# Patient Record
Sex: Male | Born: 1935 | Race: Black or African American | Hispanic: No | Smoking: Former smoker
Health system: Southern US, Community
[De-identification: ages and names within clinical notes are randomized; demographics above are authoritative.]

## PROBLEM LIST (undated history)

## (undated) DIAGNOSIS — G459 Transient cerebral ischemic attack, unspecified: Secondary | ICD-10-CM

## (undated) DIAGNOSIS — G20 Parkinson's disease: Secondary | ICD-10-CM

## (undated) DIAGNOSIS — I10 Essential (primary) hypertension: Secondary | ICD-10-CM

## (undated) DIAGNOSIS — G20A1 Parkinson's disease without dyskinesia, without mention of fluctuations: Secondary | ICD-10-CM

## (undated) DIAGNOSIS — R0602 Shortness of breath: Secondary | ICD-10-CM

## (undated) DIAGNOSIS — I209 Angina pectoris, unspecified: Secondary | ICD-10-CM

## (undated) DIAGNOSIS — I2510 Atherosclerotic heart disease of native coronary artery without angina pectoris: Secondary | ICD-10-CM

## (undated) DIAGNOSIS — I639 Cerebral infarction, unspecified: Secondary | ICD-10-CM

## (undated) DIAGNOSIS — F0390 Unspecified dementia without behavioral disturbance: Secondary | ICD-10-CM

## (undated) DIAGNOSIS — C3490 Malignant neoplasm of unspecified part of unspecified bronchus or lung: Secondary | ICD-10-CM

## (undated) DIAGNOSIS — N289 Disorder of kidney and ureter, unspecified: Secondary | ICD-10-CM

## (undated) HISTORY — PX: LOBECTOMY: SHX5089

## (undated) HISTORY — PX: CORONARY ANGIOPLASTY WITH STENT PLACEMENT: SHX49

## (undated) HISTORY — PX: LASIK: SHX215

---

## 1998-09-23 ENCOUNTER — Inpatient Hospital Stay (HOSPITAL_COMMUNITY): Admission: EM | Admit: 1998-09-23 | Discharge: 1998-09-27 | Payer: Self-pay | Admitting: Emergency Medicine

## 1998-09-23 ENCOUNTER — Encounter: Payer: Self-pay | Admitting: Emergency Medicine

## 1998-12-10 ENCOUNTER — Other Ambulatory Visit: Admission: RE | Admit: 1998-12-10 | Discharge: 1998-12-10 | Payer: Self-pay | Admitting: Urology

## 1998-12-22 ENCOUNTER — Ambulatory Visit (HOSPITAL_COMMUNITY): Admission: RE | Admit: 1998-12-22 | Discharge: 1998-12-22 | Payer: Self-pay | Admitting: Cardiology

## 1998-12-22 ENCOUNTER — Encounter: Payer: Self-pay | Admitting: Cardiology

## 1999-01-09 ENCOUNTER — Encounter: Payer: Self-pay | Admitting: Emergency Medicine

## 1999-01-09 ENCOUNTER — Emergency Department (HOSPITAL_COMMUNITY): Admission: EM | Admit: 1999-01-09 | Discharge: 1999-01-09 | Payer: Self-pay | Admitting: Emergency Medicine

## 1999-09-30 ENCOUNTER — Ambulatory Visit (HOSPITAL_COMMUNITY): Admission: RE | Admit: 1999-09-30 | Discharge: 1999-09-30 | Payer: Self-pay | Admitting: Cardiology

## 1999-09-30 ENCOUNTER — Encounter: Payer: Self-pay | Admitting: Cardiology

## 2000-04-27 ENCOUNTER — Ambulatory Visit (HOSPITAL_COMMUNITY): Admission: RE | Admit: 2000-04-27 | Discharge: 2000-04-27 | Payer: Self-pay | Admitting: *Deleted

## 2000-05-31 ENCOUNTER — Ambulatory Visit (HOSPITAL_COMMUNITY): Admission: RE | Admit: 2000-05-31 | Discharge: 2000-06-01 | Payer: Self-pay | Admitting: Cardiology

## 2000-05-31 ENCOUNTER — Encounter: Payer: Self-pay | Admitting: Cardiology

## 2000-07-14 ENCOUNTER — Inpatient Hospital Stay (HOSPITAL_COMMUNITY): Admission: EM | Admit: 2000-07-14 | Discharge: 2000-07-15 | Payer: Self-pay | Admitting: Emergency Medicine

## 2000-07-14 ENCOUNTER — Encounter: Payer: Self-pay | Admitting: Emergency Medicine

## 2000-07-15 ENCOUNTER — Encounter: Payer: Self-pay | Admitting: Cardiology

## 2000-07-23 ENCOUNTER — Encounter: Payer: Self-pay | Admitting: Emergency Medicine

## 2000-07-23 ENCOUNTER — Emergency Department (HOSPITAL_COMMUNITY): Admission: EM | Admit: 2000-07-23 | Discharge: 2000-07-23 | Payer: Self-pay | Admitting: Emergency Medicine

## 2000-10-30 ENCOUNTER — Encounter: Payer: Self-pay | Admitting: Cardiology

## 2000-10-30 ENCOUNTER — Ambulatory Visit (HOSPITAL_COMMUNITY): Admission: RE | Admit: 2000-10-30 | Discharge: 2000-10-30 | Payer: Self-pay | Admitting: Cardiology

## 2000-11-09 ENCOUNTER — Ambulatory Visit (HOSPITAL_COMMUNITY): Admission: RE | Admit: 2000-11-09 | Discharge: 2000-11-09 | Payer: Self-pay | Admitting: Pulmonary Disease

## 2000-11-09 ENCOUNTER — Encounter: Payer: Self-pay | Admitting: Pulmonary Disease

## 2001-02-25 ENCOUNTER — Encounter: Admission: RE | Admit: 2001-02-25 | Discharge: 2001-02-25 | Payer: Self-pay | Admitting: Cardiology

## 2001-02-25 ENCOUNTER — Encounter: Payer: Self-pay | Admitting: Cardiology

## 2001-03-12 ENCOUNTER — Ambulatory Visit (HOSPITAL_COMMUNITY): Admission: RE | Admit: 2001-03-12 | Discharge: 2001-03-12 | Payer: Self-pay | Admitting: Pulmonary Disease

## 2001-03-12 ENCOUNTER — Encounter: Payer: Self-pay | Admitting: Pulmonary Disease

## 2001-05-15 ENCOUNTER — Ambulatory Visit (HOSPITAL_COMMUNITY): Admission: RE | Admit: 2001-05-15 | Discharge: 2001-05-15 | Payer: Self-pay | Admitting: Cardiology

## 2001-05-15 ENCOUNTER — Encounter: Payer: Self-pay | Admitting: Cardiology

## 2001-05-21 ENCOUNTER — Ambulatory Visit (HOSPITAL_COMMUNITY): Admission: RE | Admit: 2001-05-21 | Discharge: 2001-05-22 | Payer: Self-pay | Admitting: Cardiology

## 2001-12-12 ENCOUNTER — Ambulatory Visit (HOSPITAL_COMMUNITY): Admission: RE | Admit: 2001-12-12 | Discharge: 2001-12-12 | Payer: Self-pay | Admitting: Pulmonary Disease

## 2001-12-12 ENCOUNTER — Encounter: Payer: Self-pay | Admitting: Pulmonary Disease

## 2002-04-17 ENCOUNTER — Ambulatory Visit (HOSPITAL_COMMUNITY): Admission: RE | Admit: 2002-04-17 | Discharge: 2002-04-17 | Payer: Self-pay | Admitting: General Surgery

## 2002-04-17 ENCOUNTER — Encounter (INDEPENDENT_AMBULATORY_CARE_PROVIDER_SITE_OTHER): Payer: Self-pay | Admitting: Specialist

## 2002-06-10 ENCOUNTER — Encounter: Payer: Self-pay | Admitting: Cardiology

## 2002-06-10 ENCOUNTER — Encounter: Admission: RE | Admit: 2002-06-10 | Discharge: 2002-06-10 | Payer: Self-pay | Admitting: Cardiology

## 2002-12-04 ENCOUNTER — Emergency Department (HOSPITAL_COMMUNITY): Admission: EM | Admit: 2002-12-04 | Discharge: 2002-12-04 | Payer: Self-pay | Admitting: Emergency Medicine

## 2002-12-16 ENCOUNTER — Emergency Department (HOSPITAL_COMMUNITY): Admission: EM | Admit: 2002-12-16 | Discharge: 2002-12-17 | Payer: Self-pay

## 2003-01-27 ENCOUNTER — Ambulatory Visit (HOSPITAL_COMMUNITY): Admission: RE | Admit: 2003-01-27 | Discharge: 2003-01-27 | Payer: Self-pay | Admitting: Cardiology

## 2003-03-05 ENCOUNTER — Emergency Department (HOSPITAL_COMMUNITY): Admission: AD | Admit: 2003-03-05 | Discharge: 2003-03-05 | Payer: Self-pay | Admitting: Family Medicine

## 2003-03-16 ENCOUNTER — Ambulatory Visit (HOSPITAL_COMMUNITY): Admission: RE | Admit: 2003-03-16 | Discharge: 2003-03-16 | Payer: Self-pay | Admitting: Cardiology

## 2003-05-23 ENCOUNTER — Inpatient Hospital Stay (HOSPITAL_COMMUNITY): Admission: EM | Admit: 2003-05-23 | Discharge: 2003-05-28 | Payer: Self-pay | Admitting: *Deleted

## 2003-05-25 ENCOUNTER — Encounter (INDEPENDENT_AMBULATORY_CARE_PROVIDER_SITE_OTHER): Payer: Self-pay | Admitting: Cardiology

## 2003-06-16 ENCOUNTER — Encounter: Admission: RE | Admit: 2003-06-16 | Discharge: 2003-06-16 | Payer: Self-pay | Admitting: Cardiology

## 2003-06-30 ENCOUNTER — Encounter: Admission: RE | Admit: 2003-06-30 | Discharge: 2003-06-30 | Payer: Self-pay | Admitting: Cardiology

## 2003-07-19 ENCOUNTER — Emergency Department (HOSPITAL_COMMUNITY): Admission: EM | Admit: 2003-07-19 | Discharge: 2003-07-19 | Payer: Self-pay | Admitting: Emergency Medicine

## 2003-08-28 ENCOUNTER — Ambulatory Visit (HOSPITAL_COMMUNITY): Admission: RE | Admit: 2003-08-28 | Discharge: 2003-08-28 | Payer: Self-pay | Admitting: Cardiology

## 2003-09-23 ENCOUNTER — Ambulatory Visit (HOSPITAL_COMMUNITY): Admission: RE | Admit: 2003-09-23 | Discharge: 2003-09-23 | Payer: Self-pay | Admitting: Cardiology

## 2003-12-15 ENCOUNTER — Inpatient Hospital Stay (HOSPITAL_COMMUNITY): Admission: RE | Admit: 2003-12-15 | Discharge: 2003-12-17 | Payer: Self-pay | Admitting: Neurosurgery

## 2004-03-01 ENCOUNTER — Encounter: Admission: RE | Admit: 2004-03-01 | Discharge: 2004-04-08 | Payer: Self-pay | Admitting: Neurosurgery

## 2004-05-03 ENCOUNTER — Ambulatory Visit (HOSPITAL_COMMUNITY): Admission: RE | Admit: 2004-05-03 | Discharge: 2004-05-03 | Payer: Self-pay | Admitting: Cardiology

## 2004-06-07 ENCOUNTER — Inpatient Hospital Stay (HOSPITAL_COMMUNITY): Admission: RE | Admit: 2004-06-07 | Discharge: 2004-06-07 | Payer: Self-pay | Admitting: Neurosurgery

## 2004-08-23 ENCOUNTER — Emergency Department (HOSPITAL_COMMUNITY): Admission: EM | Admit: 2004-08-23 | Discharge: 2004-08-23 | Payer: Self-pay | Admitting: Emergency Medicine

## 2004-08-25 ENCOUNTER — Encounter: Admission: RE | Admit: 2004-08-25 | Discharge: 2004-08-25 | Payer: Self-pay | Admitting: Neurology

## 2004-09-12 ENCOUNTER — Ambulatory Visit (HOSPITAL_COMMUNITY): Admission: RE | Admit: 2004-09-12 | Discharge: 2004-09-12 | Payer: Self-pay | Admitting: Cardiology

## 2004-11-18 ENCOUNTER — Ambulatory Visit (HOSPITAL_COMMUNITY): Admission: RE | Admit: 2004-11-18 | Discharge: 2004-11-18 | Payer: Self-pay | Admitting: Thoracic Surgery

## 2004-11-18 ENCOUNTER — Encounter (INDEPENDENT_AMBULATORY_CARE_PROVIDER_SITE_OTHER): Payer: Self-pay | Admitting: *Deleted

## 2004-12-01 ENCOUNTER — Ambulatory Visit (HOSPITAL_COMMUNITY): Admission: RE | Admit: 2004-12-01 | Discharge: 2004-12-01 | Payer: Self-pay | Admitting: Thoracic Surgery

## 2004-12-01 ENCOUNTER — Encounter (INDEPENDENT_AMBULATORY_CARE_PROVIDER_SITE_OTHER): Payer: Self-pay | Admitting: Specialist

## 2004-12-03 ENCOUNTER — Emergency Department (HOSPITAL_COMMUNITY): Admission: EM | Admit: 2004-12-03 | Discharge: 2004-12-03 | Payer: Self-pay | Admitting: Emergency Medicine

## 2004-12-30 ENCOUNTER — Inpatient Hospital Stay (HOSPITAL_COMMUNITY): Admission: RE | Admit: 2004-12-30 | Discharge: 2005-01-06 | Payer: Self-pay | Admitting: Thoracic Surgery

## 2004-12-30 ENCOUNTER — Encounter (INDEPENDENT_AMBULATORY_CARE_PROVIDER_SITE_OTHER): Payer: Self-pay | Admitting: *Deleted

## 2005-01-05 ENCOUNTER — Ambulatory Visit: Payer: Self-pay | Admitting: Internal Medicine

## 2005-01-11 ENCOUNTER — Encounter: Admission: RE | Admit: 2005-01-11 | Discharge: 2005-01-11 | Payer: Self-pay | Admitting: Thoracic Surgery

## 2005-01-12 IMAGING — CR DG CHEST 2V
2 series · 2 of 2 positions shown · non-contrast
Comparison: none

CLINICAL DATA: Shortness of breath intermittently.  History of vascular stent. 
 CHEST, TWO VIEWS

  The heart size and mediastinal contours are normal. The lungs are clear. The visualized skeleton is unremarkable.  There is no interval change since [REDACTED] portable chest x-ray 05/23/03.  Coronary artery stents are noted. 
 IMPRESSION
 No active disease.

[view not recorded (1 of 2)]
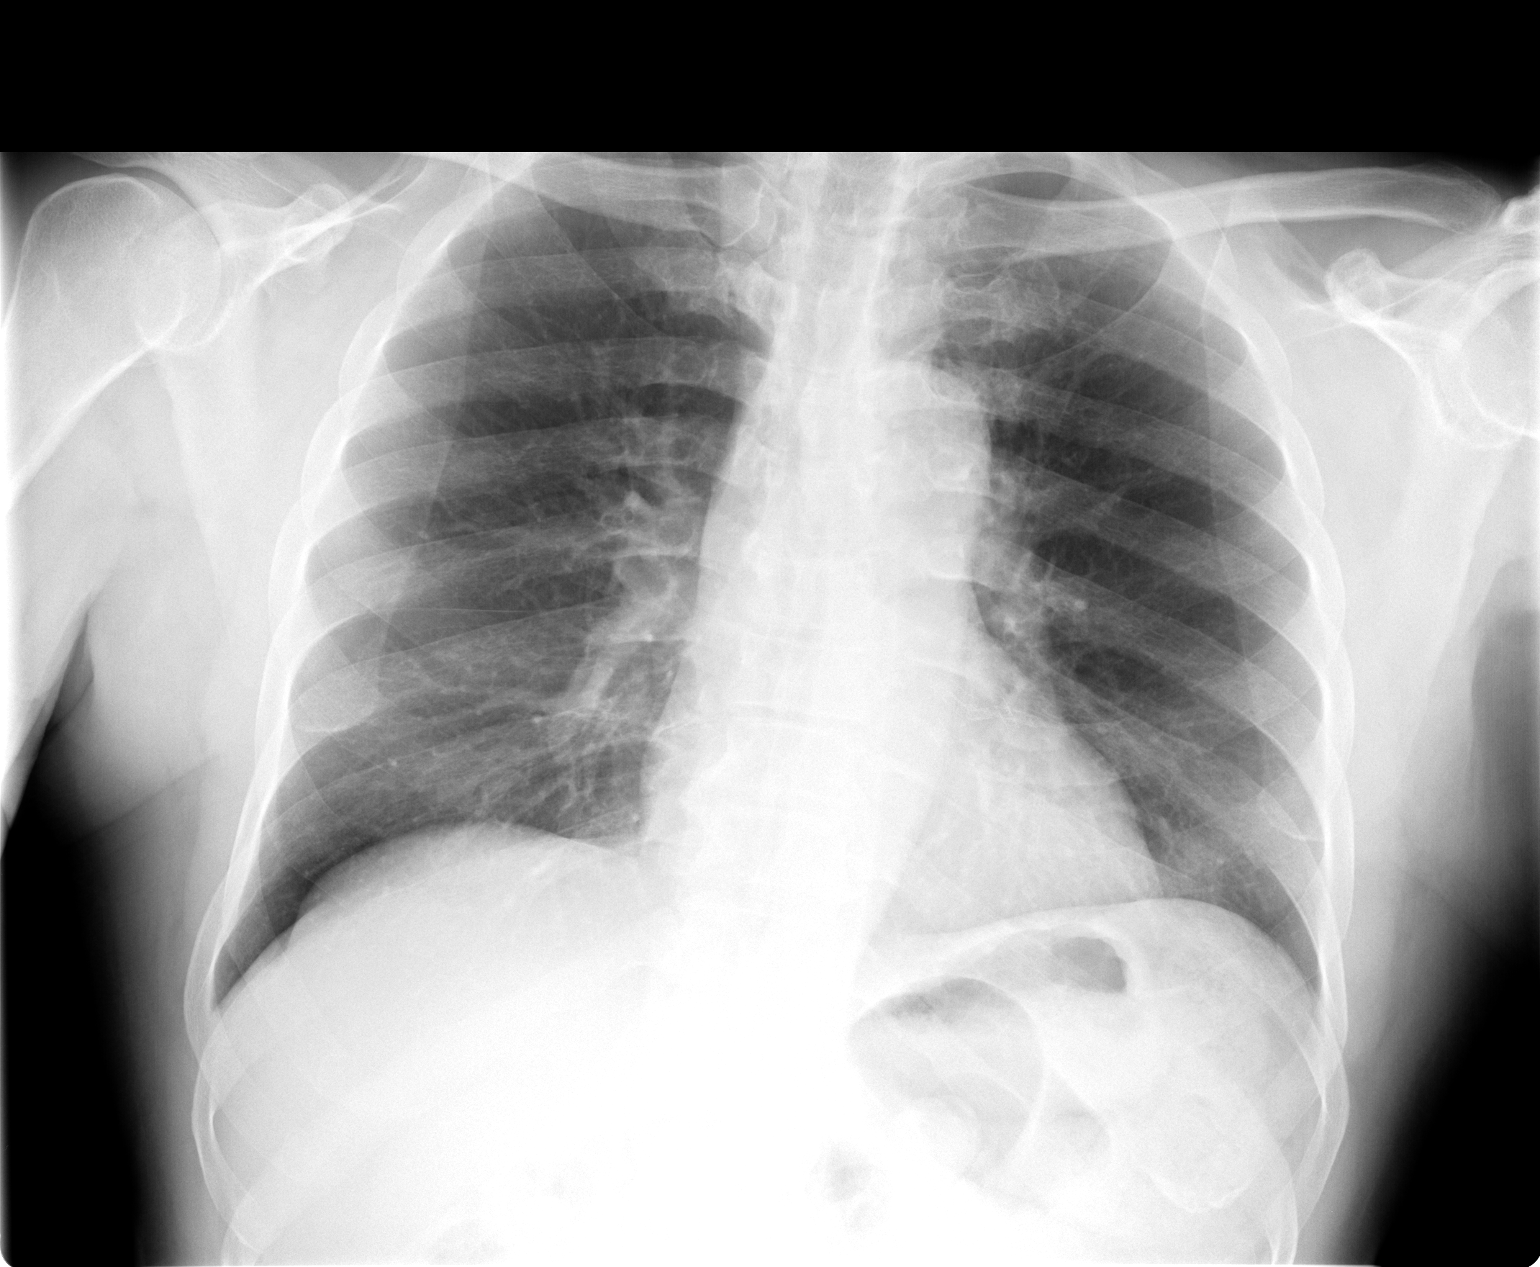

[view not recorded (2 of 2)]
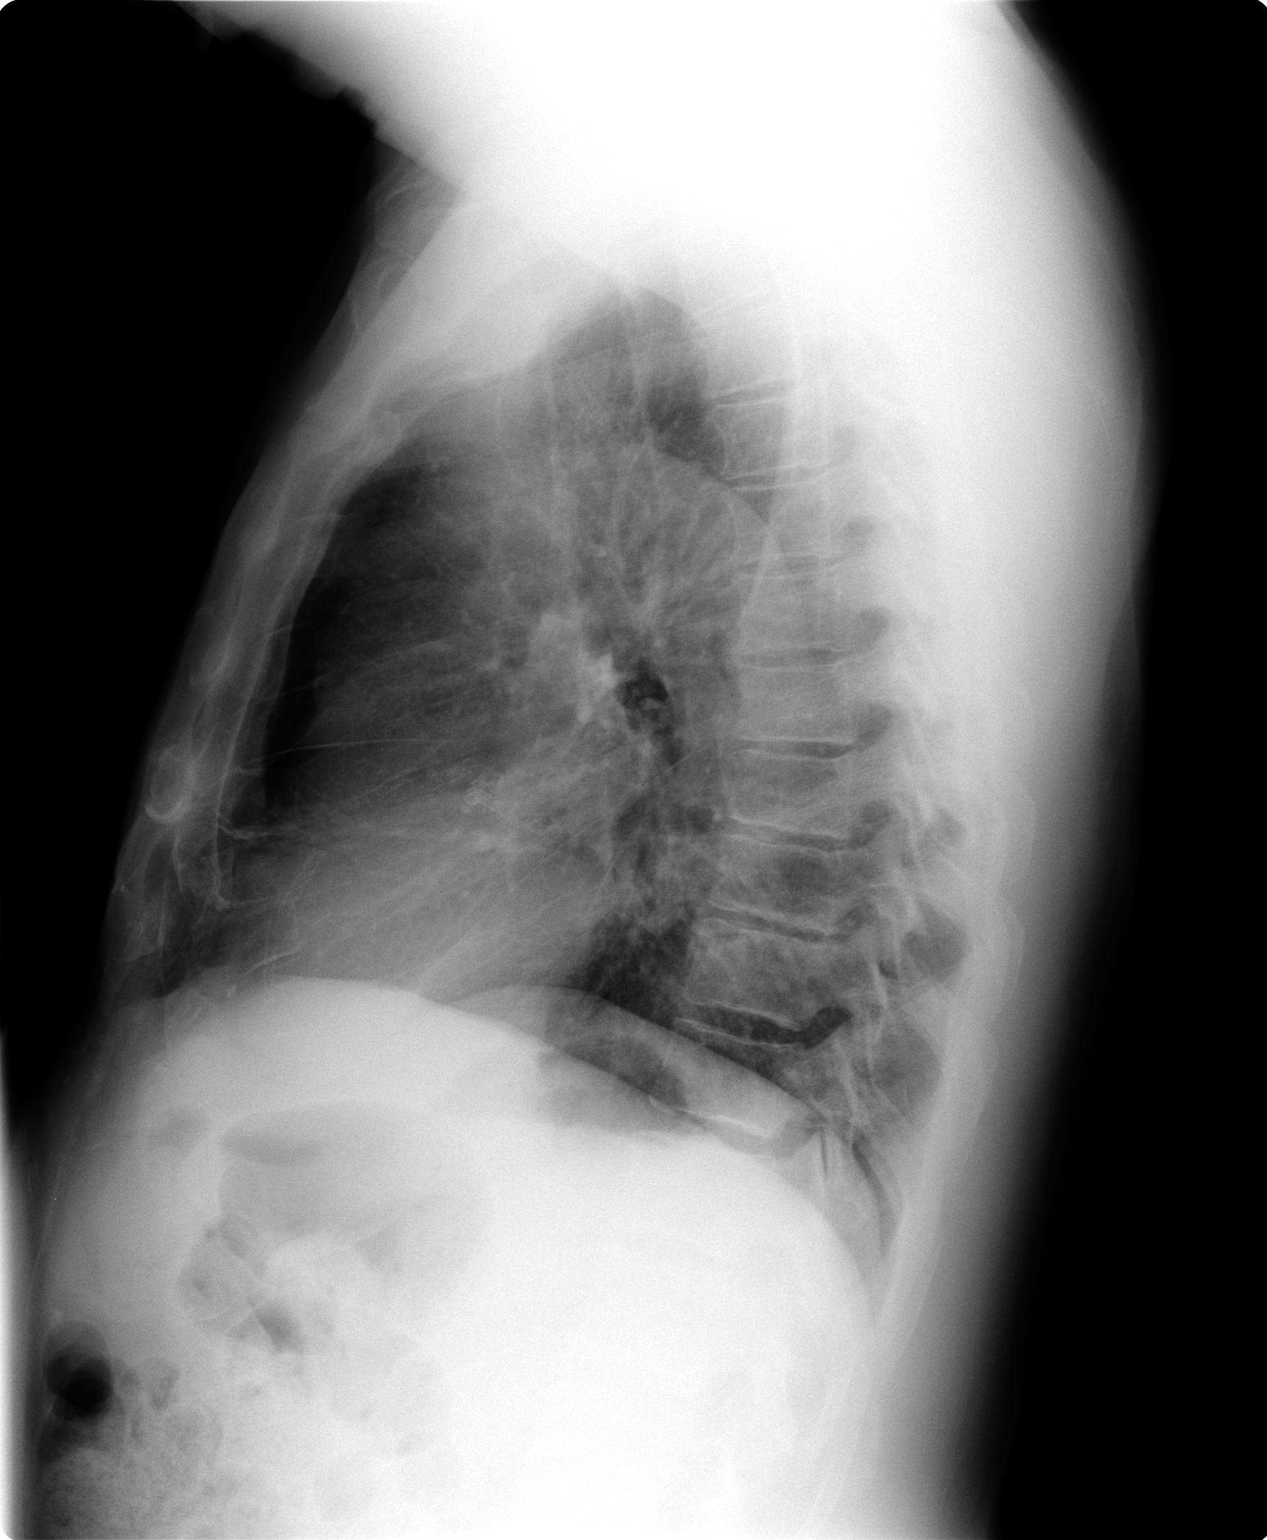

[2 of 2 positions shown; findings below may reference images not displayed]

## 2005-01-25 ENCOUNTER — Encounter: Admission: RE | Admit: 2005-01-25 | Discharge: 2005-01-25 | Payer: Self-pay | Admitting: Thoracic Surgery

## 2005-03-08 ENCOUNTER — Encounter: Admission: RE | Admit: 2005-03-08 | Discharge: 2005-03-08 | Payer: Self-pay | Admitting: Thoracic Surgery

## 2005-05-04 ENCOUNTER — Ambulatory Visit: Payer: Self-pay | Admitting: Internal Medicine

## 2005-05-04 ENCOUNTER — Ambulatory Visit (HOSPITAL_COMMUNITY): Admission: RE | Admit: 2005-05-04 | Discharge: 2005-05-04 | Payer: Self-pay | Admitting: Internal Medicine

## 2005-05-17 ENCOUNTER — Encounter: Admission: RE | Admit: 2005-05-17 | Discharge: 2005-05-17 | Payer: Self-pay | Admitting: Thoracic Surgery

## 2005-05-26 ENCOUNTER — Ambulatory Visit (HOSPITAL_COMMUNITY): Admission: RE | Admit: 2005-05-26 | Discharge: 2005-05-26 | Payer: Self-pay | Admitting: Otolaryngology

## 2005-08-11 ENCOUNTER — Encounter: Admission: RE | Admit: 2005-08-11 | Discharge: 2005-08-11 | Payer: Self-pay | Admitting: Otolaryngology

## 2005-08-20 ENCOUNTER — Ambulatory Visit: Payer: Self-pay | Admitting: Internal Medicine

## 2005-08-29 LAB — CBC WITH DIFFERENTIAL/PLATELET
BASO%: 1 % (ref 0.0–2.0)
Basophils Absolute: 0 10*3/uL (ref 0.0–0.1)
EOS%: 10 % — ABNORMAL HIGH (ref 0.0–7.0)
HCT: 41.9 % (ref 38.7–49.9)
HGB: 13.6 g/dL (ref 13.0–17.1)
LYMPH%: 24 % (ref 14.0–48.0)
MCH: 27.1 pg — ABNORMAL LOW (ref 28.0–33.4)
MCHC: 32.5 g/dL (ref 32.0–35.9)
NEUT%: 48.1 % (ref 40.0–75.0)
Platelets: 248 10*3/uL (ref 145–400)
lymph#: 0.7 10*3/uL — ABNORMAL LOW (ref 0.9–3.3)

## 2005-08-29 LAB — COMPREHENSIVE METABOLIC PANEL
AST: 22 U/L (ref 0–37)
BUN: 12 mg/dL (ref 6–23)
CO2: 28 mEq/L (ref 19–32)
Calcium: 9.5 mg/dL (ref 8.4–10.5)
Chloride: 105 mEq/L (ref 96–112)
Creatinine, Ser: 1.37 mg/dL (ref 0.40–1.50)
Total Bilirubin: 0.4 mg/dL (ref 0.3–1.2)

## 2005-08-31 ENCOUNTER — Ambulatory Visit (HOSPITAL_COMMUNITY): Admission: RE | Admit: 2005-08-31 | Discharge: 2005-08-31 | Payer: Self-pay | Admitting: Internal Medicine

## 2005-09-13 ENCOUNTER — Ambulatory Visit (HOSPITAL_COMMUNITY): Admission: RE | Admit: 2005-09-13 | Discharge: 2005-09-13 | Payer: Self-pay | Admitting: Internal Medicine

## 2005-09-19 ENCOUNTER — Encounter: Admission: RE | Admit: 2005-09-19 | Discharge: 2005-09-19 | Payer: Self-pay | Admitting: Thoracic Surgery

## 2005-09-29 ENCOUNTER — Ambulatory Visit (HOSPITAL_COMMUNITY): Admission: RE | Admit: 2005-09-29 | Discharge: 2005-09-29 | Payer: Self-pay | Admitting: Cardiology

## 2005-10-08 ENCOUNTER — Inpatient Hospital Stay (HOSPITAL_COMMUNITY): Admission: EM | Admit: 2005-10-08 | Discharge: 2005-10-24 | Payer: Self-pay | Admitting: Emergency Medicine

## 2005-10-10 ENCOUNTER — Encounter (INDEPENDENT_AMBULATORY_CARE_PROVIDER_SITE_OTHER): Payer: Self-pay | Admitting: Cardiovascular Disease

## 2005-10-16 ENCOUNTER — Ambulatory Visit: Payer: Self-pay | Admitting: Physical Medicine & Rehabilitation

## 2005-11-24 ENCOUNTER — Ambulatory Visit: Payer: Self-pay | Admitting: Internal Medicine

## 2005-11-28 LAB — CBC WITH DIFFERENTIAL/PLATELET
BASO%: 2 % (ref 0.0–2.0)
Basophils Absolute: 0.1 10*3/uL (ref 0.0–0.1)
EOS%: 7.9 % — ABNORMAL HIGH (ref 0.0–7.0)
HCT: 39.3 % (ref 38.7–49.9)
HGB: 12.5 g/dL — ABNORMAL LOW (ref 13.0–17.1)
LYMPH%: 30.7 % (ref 14.0–48.0)
MCH: 26.7 pg — ABNORMAL LOW (ref 28.0–33.4)
MCHC: 31.9 g/dL — ABNORMAL LOW (ref 32.0–35.9)
MCV: 83.7 fL (ref 81.6–98.0)
MONO%: 19.6 % — ABNORMAL HIGH (ref 0.0–13.0)
NEUT%: 39.8 % — ABNORMAL LOW (ref 40.0–75.0)
Platelets: 205 10*3/uL (ref 145–400)
lymph#: 0.9 10*3/uL (ref 0.9–3.3)

## 2005-11-28 LAB — COMPREHENSIVE METABOLIC PANEL
ALT: 15 U/L (ref 0–53)
AST: 19 U/L (ref 0–37)
Alkaline Phosphatase: 79 U/L (ref 39–117)
BUN: 17 mg/dL (ref 6–23)
Calcium: 8.6 mg/dL (ref 8.4–10.5)
Creatinine, Ser: 1.47 mg/dL (ref 0.40–1.50)
Total Bilirubin: 0.5 mg/dL (ref 0.3–1.2)

## 2005-11-30 ENCOUNTER — Ambulatory Visit (HOSPITAL_COMMUNITY): Admission: RE | Admit: 2005-11-30 | Discharge: 2005-11-30 | Payer: Self-pay | Admitting: Internal Medicine

## 2006-01-17 ENCOUNTER — Encounter: Admission: RE | Admit: 2006-01-17 | Discharge: 2006-01-17 | Payer: Self-pay | Admitting: Thoracic Surgery

## 2006-05-24 ENCOUNTER — Ambulatory Visit: Payer: Self-pay | Admitting: Internal Medicine

## 2006-05-29 LAB — CBC WITH DIFFERENTIAL/PLATELET
Basophils Absolute: 0.1 10*3/uL (ref 0.0–0.1)
HCT: 37 % — ABNORMAL LOW (ref 38.7–49.9)
HGB: 12 g/dL — ABNORMAL LOW (ref 13.0–17.1)
LYMPH%: 24.5 % (ref 14.0–48.0)
MONO#: 0.5 10*3/uL (ref 0.1–0.9)
NEUT%: 44.4 % (ref 40.0–75.0)
Platelets: 236 10*3/uL (ref 145–400)
WBC: 2.7 10*3/uL — ABNORMAL LOW (ref 4.0–10.0)
lymph#: 0.7 10*3/uL — ABNORMAL LOW (ref 0.9–3.3)

## 2006-05-29 LAB — COMPREHENSIVE METABOLIC PANEL
ALT: 20 U/L (ref 0–53)
BUN: 26 mg/dL — ABNORMAL HIGH (ref 6–23)
CO2: 28 mEq/L (ref 19–32)
Calcium: 8.8 mg/dL (ref 8.4–10.5)
Chloride: 107 mEq/L (ref 96–112)
Creatinine, Ser: 1.26 mg/dL (ref 0.40–1.50)
Glucose, Bld: 96 mg/dL (ref 70–99)

## 2006-05-31 ENCOUNTER — Ambulatory Visit (HOSPITAL_COMMUNITY): Admission: RE | Admit: 2006-05-31 | Discharge: 2006-05-31 | Payer: Self-pay | Admitting: Internal Medicine

## 2006-10-23 ENCOUNTER — Encounter: Admission: RE | Admit: 2006-10-23 | Discharge: 2006-10-23 | Payer: Self-pay | Admitting: Cardiology

## 2006-11-23 ENCOUNTER — Ambulatory Visit: Payer: Self-pay | Admitting: Internal Medicine

## 2006-11-27 ENCOUNTER — Ambulatory Visit (HOSPITAL_COMMUNITY): Admission: RE | Admit: 2006-11-27 | Discharge: 2006-11-27 | Payer: Self-pay | Admitting: Internal Medicine

## 2006-11-27 LAB — COMPREHENSIVE METABOLIC PANEL
ALT: 25 U/L (ref 0–53)
CO2: 31 mEq/L (ref 19–32)
Calcium: 9.4 mg/dL (ref 8.4–10.5)
Chloride: 106 mEq/L (ref 96–112)
Creatinine, Ser: 1.32 mg/dL (ref 0.40–1.50)
Total Protein: 8 g/dL (ref 6.0–8.3)

## 2006-11-27 LAB — CBC WITH DIFFERENTIAL/PLATELET
BASO%: 1.3 % (ref 0.0–2.0)
Eosinophils Absolute: 0.2 10*3/uL (ref 0.0–0.5)
HCT: 38.4 % — ABNORMAL LOW (ref 38.7–49.9)
HGB: 12.7 g/dL — ABNORMAL LOW (ref 13.0–17.1)
MCHC: 33 g/dL (ref 32.0–35.9)
MONO#: 0.6 10*3/uL (ref 0.1–0.9)
NEUT#: 1.5 10*3/uL (ref 1.5–6.5)
NEUT%: 49 % (ref 40.0–75.0)
WBC: 3 10*3/uL — ABNORMAL LOW (ref 4.0–10.0)
lymph#: 0.7 10*3/uL — ABNORMAL LOW (ref 0.9–3.3)

## 2007-05-22 ENCOUNTER — Ambulatory Visit: Payer: Self-pay | Admitting: Internal Medicine

## 2007-05-22 IMAGING — CT CT ANGIO CHEST
2 of 4 series · 19 of 36 positions shown · IV contrast (100 ML OMNI 300)
Comparison: Chest x-ray 10/08/05 and CT chest 08/31/05.

CLINICAL DATA: Elevated D-dimer with dizziness and near syncope.  Chest pain.  History of   lung cancer.  
CT ANGIOGRAPHY OF CHEST:
TECHNIQUE: Multidetector CT imaging of the chest was performed during bolus injection of intravenous contrast.  Multiplanar CT angiographic image reconstructions were generated to evaluate the vascular anatomy.
Contrast:  100 cc Omnipaque 300.

[Series 2: pe · axial · 0.68mm/px · z∈[-272,-16]mm · 16 of 230 slices shown]
[im 13/230  lung]
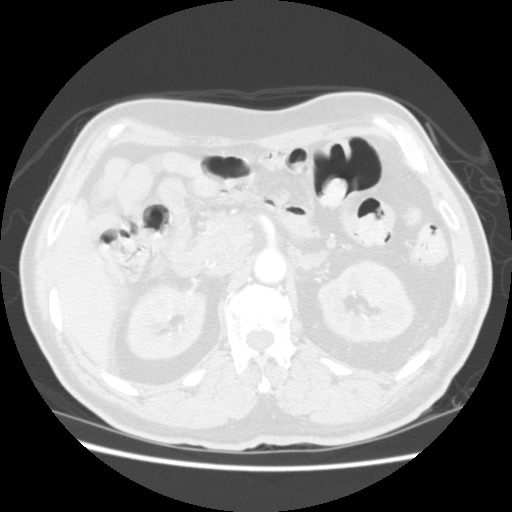
[im 26/230  mediastinal]
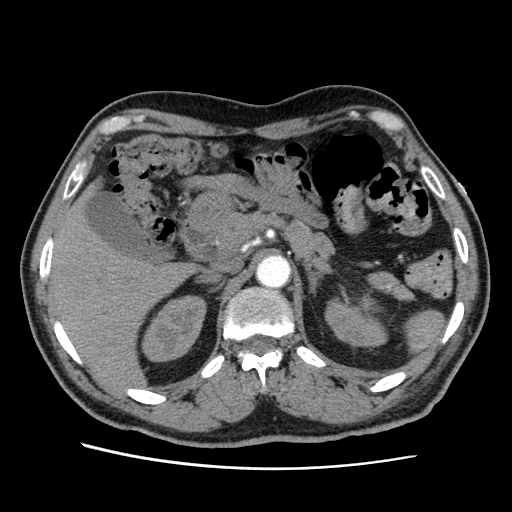
[im 39/230  lung]
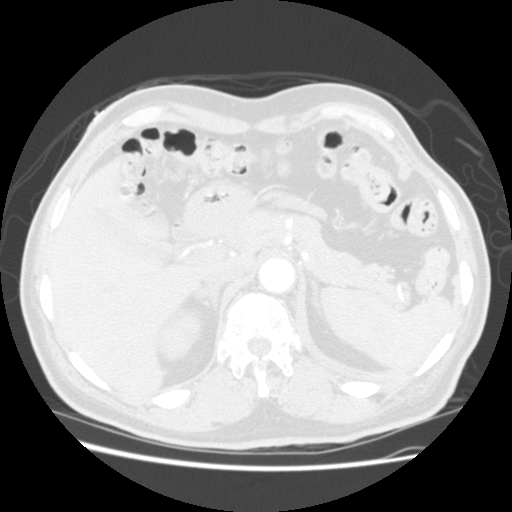
[im 51/230  mediastinal]
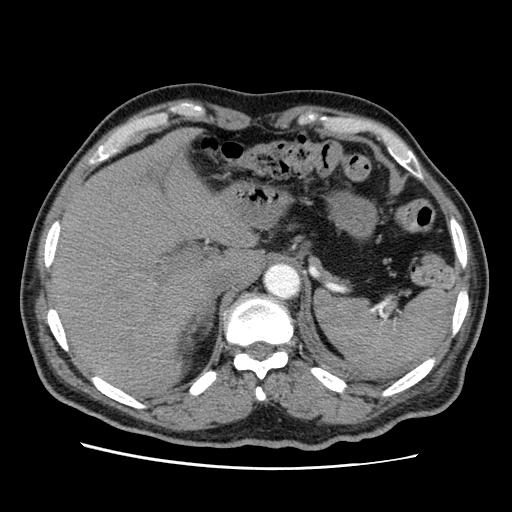
[im 64/230  lung]
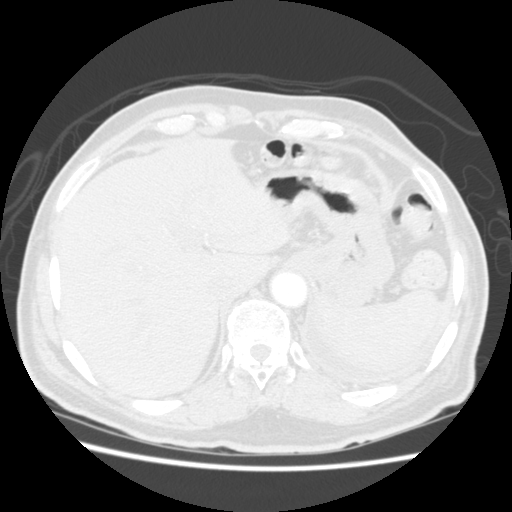
[im 77/230  mediastinal]
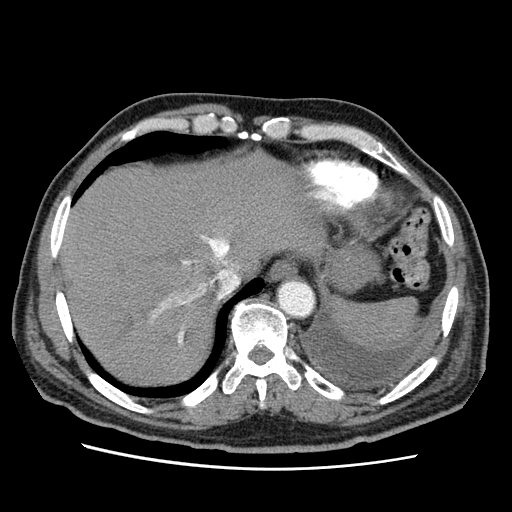
[im 90/230  lung]
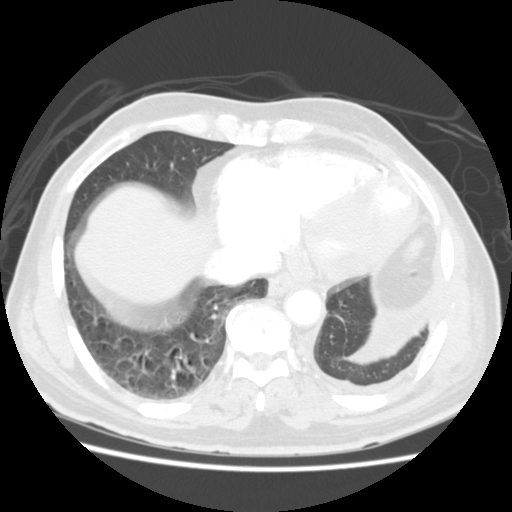
[im 102/230  mediastinal]
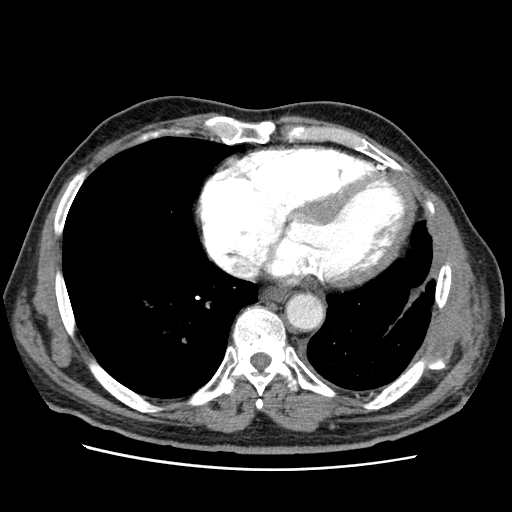
[im 128/230  lung]
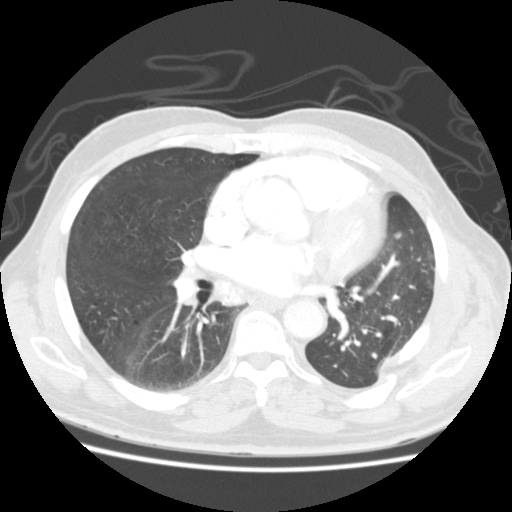
[im 140/230  mediastinal]
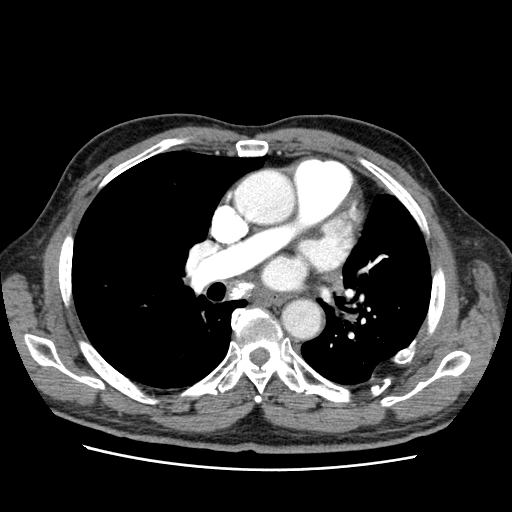
[im 153/230  lung]
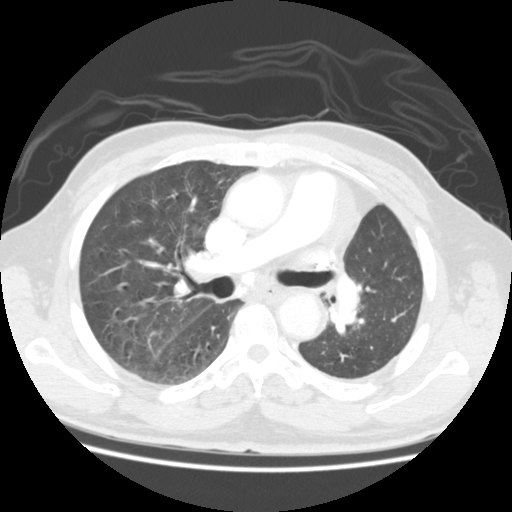
[im 166/230  mediastinal]
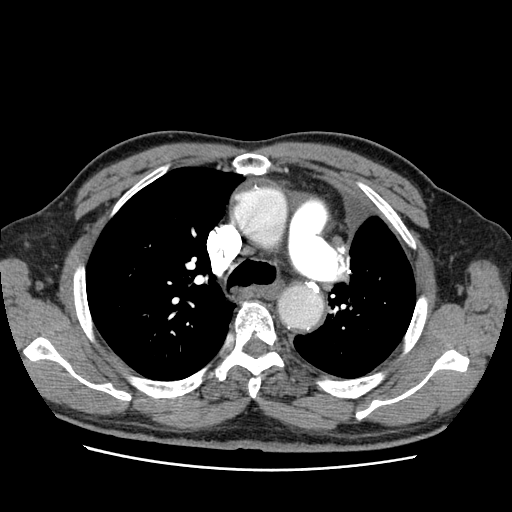
[im 179/230  lung]
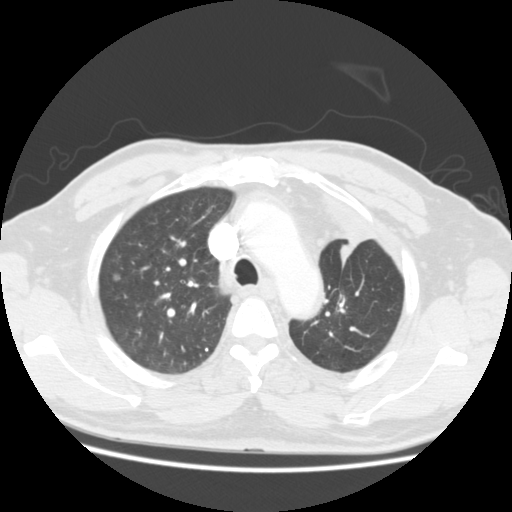
[im 191/230  mediastinal]
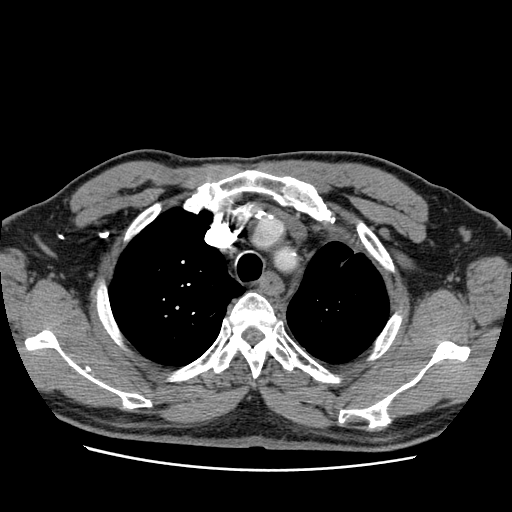
[im 204/230  lung]
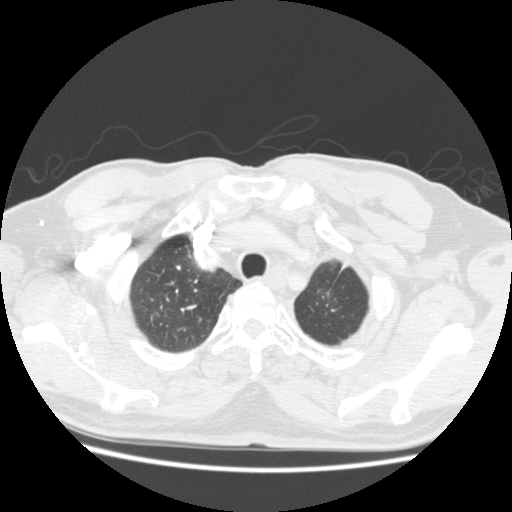
[im 217/230  mediastinal]
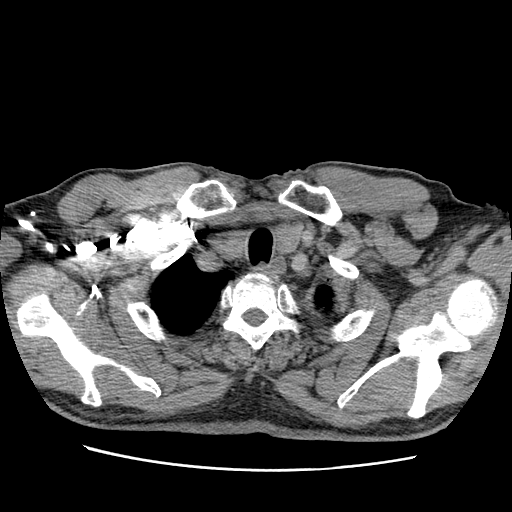

[Series 201: reformatted · coronal · 0.66mm/px · 3 of 91 slices shown]
[im 19/91  mediastinal]
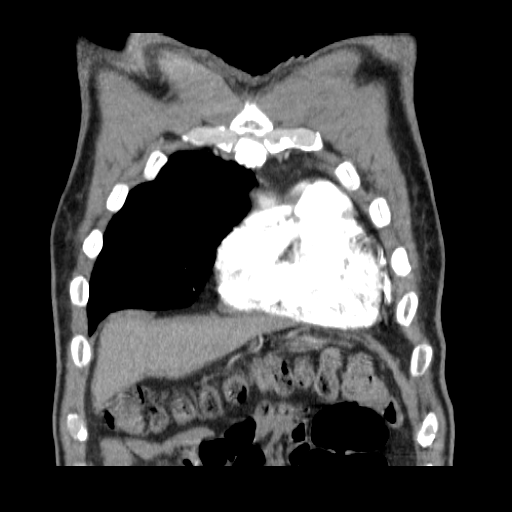
[im 37/91  mediastinal]
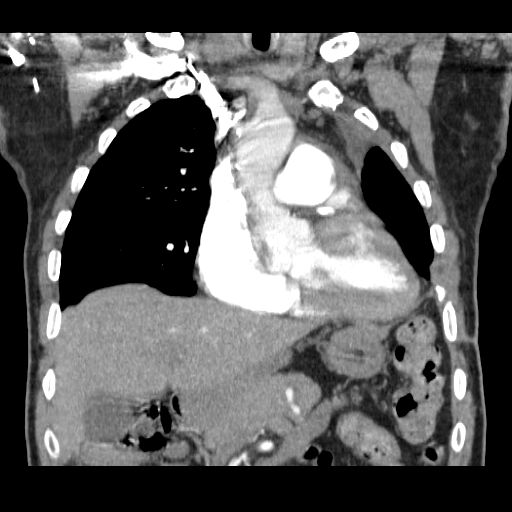
[im 55/91  mediastinal]
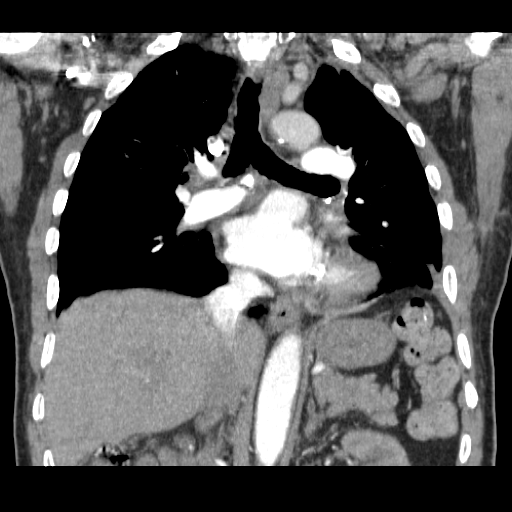

[19 of 36 positions shown; findings below may reference images not displayed]

FINDINGS: No CT evidence of pulmonary embolus.  Central pulmonary arteries are enlarged.  Heart size is enlarged.  Coronary artery calcifications.  No pericardial fluid.  There are 2 areas of ulcerative plaque in the proximal aortic arch, as before.  
Thyroid is asymmetrically enlarged on the right.  Calcified and non-calcified mediastinal lymph nodes are seen.  
Small left pleural effusion with pleural thickening.  The patient is status post left upper lobectomy.  A vague nodule in the right upper lobe (image 26) is unchanged.  There are adjacent small nodules in a peribronchovascular distribution which are likely post infectious.  Debris is seen in the right mainstem bronchus.
Incidental imaging of the upper abdomen shows no acute findings.  No worrisome lytic or sclerotic lesions.  Thoracotomy changes are seen on the left.
IMPRESSION: 1.  No PE.
2.  Cardiomegaly, pulmonary arterial hypertension and ulcerative plaque in the proximal transverse aorta.  
  Trace pleural effusion with interval resolution of pleural air.  
4.  Debris in the right mainstem bronchus.  
5.  Asymmetrically enlarged right lobe of the thyroid.

## 2007-06-04 ENCOUNTER — Ambulatory Visit (HOSPITAL_COMMUNITY): Admission: RE | Admit: 2007-06-04 | Discharge: 2007-06-04 | Payer: Self-pay | Admitting: Internal Medicine

## 2007-06-10 LAB — CBC WITH DIFFERENTIAL/PLATELET
BASO%: 0.4 % (ref 0.0–2.0)
EOS%: 8.9 % — ABNORMAL HIGH (ref 0.0–7.0)
MCH: 27.5 pg — ABNORMAL LOW (ref 28.0–33.4)
MCHC: 32.6 g/dL (ref 32.0–35.9)
MCV: 84.6 fL (ref 81.6–98.0)
MONO%: 18.3 % — ABNORMAL HIGH (ref 0.0–13.0)
NEUT%: 49.7 % (ref 40.0–75.0)
RDW: 15.6 % — ABNORMAL HIGH (ref 11.2–14.6)
lymph#: 0.7 10*3/uL — ABNORMAL LOW (ref 0.9–3.3)

## 2007-06-10 LAB — COMPREHENSIVE METABOLIC PANEL
ALT: 21 U/L (ref 0–53)
AST: 25 U/L (ref 0–37)
Alkaline Phosphatase: 65 U/L (ref 39–117)
Calcium: 8.6 mg/dL (ref 8.4–10.5)
Chloride: 109 mEq/L (ref 96–112)
Creatinine, Ser: 1.3 mg/dL (ref 0.40–1.50)
Potassium: 3.7 mEq/L (ref 3.5–5.3)

## 2007-07-18 ENCOUNTER — Encounter: Admission: RE | Admit: 2007-07-18 | Discharge: 2007-07-18 | Payer: Self-pay | Admitting: Cardiology

## 2007-11-15 ENCOUNTER — Ambulatory Visit (HOSPITAL_COMMUNITY): Admission: RE | Admit: 2007-11-15 | Discharge: 2007-11-15 | Payer: Self-pay | Admitting: Cardiology

## 2007-11-27 ENCOUNTER — Ambulatory Visit: Payer: Self-pay | Admitting: Internal Medicine

## 2007-12-06 ENCOUNTER — Ambulatory Visit (HOSPITAL_COMMUNITY): Admission: RE | Admit: 2007-12-06 | Discharge: 2007-12-06 | Payer: Self-pay | Admitting: Internal Medicine

## 2008-02-28 ENCOUNTER — Encounter: Admission: RE | Admit: 2008-02-28 | Discharge: 2008-02-28 | Payer: Self-pay | Admitting: Cardiology

## 2008-03-31 ENCOUNTER — Ambulatory Visit: Payer: Self-pay | Admitting: Surgery

## 2008-03-31 ENCOUNTER — Encounter (INDEPENDENT_AMBULATORY_CARE_PROVIDER_SITE_OTHER): Payer: Self-pay | Admitting: Cardiology

## 2008-03-31 ENCOUNTER — Ambulatory Visit (HOSPITAL_COMMUNITY): Admission: RE | Admit: 2008-03-31 | Discharge: 2008-03-31 | Payer: Self-pay | Admitting: Cardiology

## 2008-05-26 ENCOUNTER — Ambulatory Visit: Payer: Self-pay | Admitting: Internal Medicine

## 2008-05-28 ENCOUNTER — Ambulatory Visit (HOSPITAL_COMMUNITY): Admission: RE | Admit: 2008-05-28 | Discharge: 2008-05-28 | Payer: Self-pay | Admitting: Internal Medicine

## 2008-05-28 LAB — CBC WITH DIFFERENTIAL/PLATELET
BASO%: 0.4 % (ref 0.0–2.0)
EOS%: 6.1 % (ref 0.0–7.0)
HGB: 12.2 g/dL — ABNORMAL LOW (ref 13.0–17.1)
MCH: 28 pg (ref 27.2–33.4)
MCHC: 32.2 g/dL (ref 32.0–36.0)
RBC: 4.36 10*6/uL (ref 4.20–5.82)
RDW: 17.4 % — ABNORMAL HIGH (ref 11.0–14.6)
lymph#: 0.6 10*3/uL — ABNORMAL LOW (ref 0.9–3.3)

## 2008-05-28 LAB — COMPREHENSIVE METABOLIC PANEL
ALT: 22 U/L (ref 0–53)
AST: 26 U/L (ref 0–37)
Albumin: 3.3 g/dL — ABNORMAL LOW (ref 3.5–5.2)
Alkaline Phosphatase: 75 U/L (ref 39–117)
Calcium: 8.8 mg/dL (ref 8.4–10.5)
Chloride: 103 mEq/L (ref 96–112)
Potassium: 3.4 mEq/L — ABNORMAL LOW (ref 3.5–5.3)
Sodium: 138 mEq/L (ref 135–145)

## 2008-07-16 ENCOUNTER — Encounter: Admission: RE | Admit: 2008-07-16 | Discharge: 2008-07-16 | Payer: Self-pay | Admitting: Rheumatology

## 2008-08-28 ENCOUNTER — Ambulatory Visit: Payer: Self-pay | Admitting: Internal Medicine

## 2008-09-01 ENCOUNTER — Ambulatory Visit (HOSPITAL_COMMUNITY): Admission: RE | Admit: 2008-09-01 | Discharge: 2008-09-01 | Payer: Self-pay | Admitting: Internal Medicine

## 2008-09-01 LAB — CBC WITH DIFFERENTIAL/PLATELET
Basophils Absolute: 0 10*3/uL (ref 0.0–0.1)
EOS%: 5.3 % (ref 0.0–7.0)
Eosinophils Absolute: 0.2 10*3/uL (ref 0.0–0.5)
HGB: 12.7 g/dL — ABNORMAL LOW (ref 13.0–17.1)
MCH: 27.9 pg (ref 27.2–33.4)
NEUT#: 3.1 10*3/uL (ref 1.5–6.5)
RDW: 16.6 % — ABNORMAL HIGH (ref 11.0–14.6)
lymph#: 0.5 10*3/uL — ABNORMAL LOW (ref 0.9–3.3)

## 2008-09-01 LAB — COMPREHENSIVE METABOLIC PANEL
ALT: 25 U/L (ref 0–53)
AST: 29 U/L (ref 0–37)
Albumin: 3.5 g/dL (ref 3.5–5.2)
Alkaline Phosphatase: 70 U/L (ref 39–117)
BUN: 20 mg/dL (ref 6–23)
CO2: 32 mEq/L (ref 19–32)
Calcium: 9.2 mg/dL (ref 8.4–10.5)
Chloride: 104 mEq/L (ref 96–112)
Creatinine, Ser: 1.28 mg/dL (ref 0.40–1.50)
Glucose, Bld: 107 mg/dL — ABNORMAL HIGH (ref 70–99)
Potassium: 3.8 mEq/L (ref 3.5–5.3)
Sodium: 139 mEq/L (ref 135–145)
Total Bilirubin: 0.4 mg/dL (ref 0.3–1.2)
Total Protein: 7.8 g/dL (ref 6.0–8.3)

## 2008-09-10 ENCOUNTER — Ambulatory Visit (HOSPITAL_COMMUNITY): Admission: RE | Admit: 2008-09-10 | Discharge: 2008-09-10 | Payer: Self-pay | Admitting: Internal Medicine

## 2008-11-05 ENCOUNTER — Ambulatory Visit: Payer: Self-pay | Admitting: Cardiology

## 2008-11-05 ENCOUNTER — Inpatient Hospital Stay (HOSPITAL_COMMUNITY): Admission: EM | Admit: 2008-11-05 | Discharge: 2008-11-13 | Payer: Self-pay | Admitting: Emergency Medicine

## 2008-11-06 ENCOUNTER — Ambulatory Visit: Payer: Self-pay | Admitting: Surgery

## 2008-11-06 ENCOUNTER — Encounter (INDEPENDENT_AMBULATORY_CARE_PROVIDER_SITE_OTHER): Payer: Self-pay | Admitting: Cardiology

## 2008-11-09 ENCOUNTER — Ambulatory Visit: Payer: Self-pay | Admitting: Physical Medicine & Rehabilitation

## 2008-11-10 ENCOUNTER — Ambulatory Visit: Payer: Self-pay | Admitting: Internal Medicine

## 2008-11-10 ENCOUNTER — Encounter: Payer: Self-pay | Admitting: Cardiology

## 2009-02-08 ENCOUNTER — Ambulatory Visit (HOSPITAL_BASED_OUTPATIENT_CLINIC_OR_DEPARTMENT_OTHER): Admission: RE | Admit: 2009-02-08 | Discharge: 2009-02-08 | Payer: Self-pay | Admitting: Cardiology

## 2009-02-28 ENCOUNTER — Ambulatory Visit: Payer: Self-pay | Admitting: Internal Medicine

## 2009-03-08 ENCOUNTER — Ambulatory Visit: Payer: Self-pay | Admitting: Internal Medicine

## 2009-03-10 ENCOUNTER — Ambulatory Visit (HOSPITAL_COMMUNITY): Admission: RE | Admit: 2009-03-10 | Discharge: 2009-03-10 | Payer: Self-pay | Admitting: Internal Medicine

## 2009-03-10 LAB — CBC WITH DIFFERENTIAL/PLATELET
Basophils Absolute: 0 10*3/uL (ref 0.0–0.1)
EOS%: 11.6 % — ABNORMAL HIGH (ref 0.0–7.0)
HCT: 41.5 % (ref 38.4–49.9)
HGB: 13.5 g/dL (ref 13.0–17.1)
MCH: 29 pg (ref 27.2–33.4)
MCV: 89.3 fL (ref 79.3–98.0)
NEUT%: 51.7 % (ref 39.0–75.0)
lymph#: 0.7 10*3/uL — ABNORMAL LOW (ref 0.9–3.3)

## 2009-03-10 LAB — COMPREHENSIVE METABOLIC PANEL
AST: 25 U/L (ref 0–37)
BUN: 25 mg/dL — ABNORMAL HIGH (ref 6–23)
Calcium: 9.3 mg/dL (ref 8.4–10.5)
Chloride: 101 mEq/L (ref 96–112)
Creatinine, Ser: 1.53 mg/dL — ABNORMAL HIGH (ref 0.40–1.50)

## 2009-03-23 ENCOUNTER — Ambulatory Visit: Admission: RE | Admit: 2009-03-23 | Discharge: 2009-06-14 | Payer: Self-pay | Admitting: Radiation Oncology

## 2009-09-08 ENCOUNTER — Ambulatory Visit: Payer: Self-pay | Admitting: Internal Medicine

## 2009-09-08 ENCOUNTER — Ambulatory Visit (HOSPITAL_COMMUNITY): Admission: RE | Admit: 2009-09-08 | Discharge: 2009-09-08 | Payer: Self-pay | Admitting: Internal Medicine

## 2009-09-08 LAB — CBC WITH DIFFERENTIAL/PLATELET
Basophils Absolute: 0 10*3/uL (ref 0.0–0.1)
EOS%: 13.2 % — ABNORMAL HIGH (ref 0.0–7.0)
HCT: 39.9 % (ref 38.4–49.9)
HGB: 12.8 g/dL — ABNORMAL LOW (ref 13.0–17.1)
LYMPH%: 26.9 % (ref 14.0–49.0)
MCH: 28.5 pg (ref 27.2–33.4)
MCV: 88.8 fL (ref 79.3–98.0)
MONO%: 12.6 % (ref 0.0–14.0)
NEUT%: 46.7 % (ref 39.0–75.0)

## 2009-09-08 LAB — COMPREHENSIVE METABOLIC PANEL
AST: 21 U/L (ref 0–37)
Alkaline Phosphatase: 52 U/L (ref 39–117)
BUN: 19 mg/dL (ref 6–23)
Calcium: 9.5 mg/dL (ref 8.4–10.5)
Creatinine, Ser: 1.67 mg/dL — ABNORMAL HIGH (ref 0.40–1.50)
Total Bilirubin: 0.8 mg/dL (ref 0.3–1.2)

## 2010-01-22 ENCOUNTER — Other Ambulatory Visit: Payer: Self-pay | Admitting: Internal Medicine

## 2010-01-22 DIAGNOSIS — C3490 Malignant neoplasm of unspecified part of unspecified bronchus or lung: Secondary | ICD-10-CM

## 2010-01-23 ENCOUNTER — Encounter: Payer: Self-pay | Admitting: Otolaryngology

## 2010-01-23 ENCOUNTER — Encounter: Payer: Self-pay | Admitting: Cardiology

## 2010-01-23 ENCOUNTER — Encounter: Payer: Self-pay | Admitting: Internal Medicine

## 2010-03-14 ENCOUNTER — Other Ambulatory Visit: Payer: Self-pay | Admitting: Internal Medicine

## 2010-03-14 ENCOUNTER — Ambulatory Visit (HOSPITAL_COMMUNITY)
Admission: RE | Admit: 2010-03-14 | Discharge: 2010-03-14 | Disposition: A | Discharge status: Home / Self Care | Payer: Medicare Other | Source: Ambulatory Visit | Attending: Internal Medicine | Admitting: Internal Medicine

## 2010-03-14 ENCOUNTER — Encounter (HOSPITAL_BASED_OUTPATIENT_CLINIC_OR_DEPARTMENT_OTHER): Payer: Medicare Other | Admitting: Internal Medicine

## 2010-03-14 DIAGNOSIS — C3410 Malignant neoplasm of upper lobe, unspecified bronchus or lung: Secondary | ICD-10-CM

## 2010-03-14 DIAGNOSIS — C3490 Malignant neoplasm of unspecified part of unspecified bronchus or lung: Secondary | ICD-10-CM

## 2010-03-14 DIAGNOSIS — J984 Other disorders of lung: Secondary | ICD-10-CM | POA: Insufficient documentation

## 2010-03-14 DIAGNOSIS — D1779 Benign lipomatous neoplasm of other sites: Secondary | ICD-10-CM | POA: Insufficient documentation

## 2010-03-14 DIAGNOSIS — E049 Nontoxic goiter, unspecified: Secondary | ICD-10-CM | POA: Insufficient documentation

## 2010-03-14 DIAGNOSIS — K7689 Other specified diseases of liver: Secondary | ICD-10-CM | POA: Insufficient documentation

## 2010-03-14 DIAGNOSIS — I2510 Atherosclerotic heart disease of native coronary artery without angina pectoris: Secondary | ICD-10-CM | POA: Insufficient documentation

## 2010-03-14 LAB — CBC WITH DIFFERENTIAL/PLATELET
Basophils Absolute: 0 10*3/uL (ref 0.0–0.1)
Eosinophils Absolute: 0.2 10*3/uL (ref 0.0–0.5)
HCT: 39.4 % (ref 38.4–49.9)
HGB: 12.6 g/dL — ABNORMAL LOW (ref 13.0–17.1)
MONO#: 0.4 10*3/uL (ref 0.1–0.9)
NEUT#: 1.8 10*3/uL (ref 1.5–6.5)
NEUT%: 63.3 % (ref 39.0–75.0)
WBC: 2.9 10*3/uL — ABNORMAL LOW (ref 4.0–10.3)
lymph#: 0.4 10*3/uL — ABNORMAL LOW (ref 0.9–3.3)

## 2010-03-14 LAB — CMP (CANCER CENTER ONLY)
Albumin: 3.6 g/dL (ref 3.3–5.5)
BUN, Bld: 16 mg/dL (ref 7–22)
CO2: 29 mEq/L (ref 18–33)
Calcium: 9.5 mg/dL (ref 8.0–10.3)
Chloride: 98 mEq/L (ref 98–108)
Creat: 1.7 mg/dl — ABNORMAL HIGH (ref 0.6–1.2)

## 2010-03-14 MED ORDER — IOHEXOL 300 MG/ML  SOLN
60.0000 mL | Freq: Once | INTRAMUSCULAR | Status: AC | PRN
  Administered 2010-03-14: 60 mL via INTRAVENOUS

## 2010-03-21 ENCOUNTER — Other Ambulatory Visit: Payer: Self-pay | Admitting: Internal Medicine

## 2010-03-21 ENCOUNTER — Encounter (HOSPITAL_BASED_OUTPATIENT_CLINIC_OR_DEPARTMENT_OTHER): Payer: Medicare Other | Admitting: Internal Medicine

## 2010-03-21 DIAGNOSIS — C3490 Malignant neoplasm of unspecified part of unspecified bronchus or lung: Secondary | ICD-10-CM

## 2010-03-21 DIAGNOSIS — C3410 Malignant neoplasm of upper lobe, unspecified bronchus or lung: Secondary | ICD-10-CM

## 2010-03-23 ENCOUNTER — Encounter: Payer: Medicare Other | Admitting: Thoracic Surgery

## 2010-03-30 ENCOUNTER — Encounter (INDEPENDENT_AMBULATORY_CARE_PROVIDER_SITE_OTHER): Payer: Medicare Other | Admitting: Thoracic Surgery

## 2010-03-30 DIAGNOSIS — C3490 Malignant neoplasm of unspecified part of unspecified bronchus or lung: Secondary | ICD-10-CM

## 2010-03-30 NOTE — Letter (Signed)
March 30, 2010  Velora Heckler. Arbutus Ped, MD [address] [place], [place] [address]  Re:  CASHAWN, YANKO                 DOB:  08/23/1921  Dear Dr. Arbutus Ped:  I saw the patient back today in the office today.  As you  know in 2006, we did a left upper lobectomy for cancer.  He is now 17, and has now developed an enlarging lesion in his right upper lobe.  He has recently had some problems with possible hypertension and his blood pressure today was 90/50, also had a recent CVA, despite this, I do think he is probably not to tolerate a fiberoptic bronchoscopy with electromagnetic navigation.  I have gone ahead and scheduled this for April 07, 2010, at [facility].  We will have him stop his Plavix today as well as his Benicar, to make sure that there was no problems with that regarding this at the time of his general anesthesia.  I appreciate the opportunity of seeing the patient.  Sincerely,  Ines Bloomer, M.D. Electronically Signed  DPB/MEDQ  D:  03/30/2010  T:  03/30/2010  Job:  [id number]

## 2010-04-06 ENCOUNTER — Encounter (HOSPITAL_COMMUNITY)
Admission: RE | Admit: 2010-04-06 | Discharge: 2010-04-06 | Disposition: A | Discharge status: Home / Self Care | Payer: Medicare Other | Source: Ambulatory Visit | Attending: Thoracic Surgery | Admitting: Thoracic Surgery

## 2010-04-06 ENCOUNTER — Other Ambulatory Visit: Payer: Self-pay | Admitting: Thoracic Surgery

## 2010-04-06 DIAGNOSIS — R911 Solitary pulmonary nodule: Secondary | ICD-10-CM

## 2010-04-06 LAB — CBC
HCT: 40.1 % (ref 39.0–52.0)
HCT: 38.9 % — ABNORMAL LOW (ref 39.0–52.0)
HCT: 41.2 % (ref 39.0–52.0)
HCT: 41.3 % (ref 39.0–52.0)
Hemoglobin: 12.5 g/dL — ABNORMAL LOW (ref 13.0–17.0)
Hemoglobin: 13.3 g/dL (ref 13.0–17.0)
Hemoglobin: 13.5 g/dL (ref 13.0–17.0)
MCV: 88.6 fL (ref 78.0–100.0)
Platelets: 192 10*3/uL (ref 150–400)
RBC: 4.65 MIL/uL (ref 4.22–5.81)
RBC: 4.71 MIL/uL (ref 4.22–5.81)
RDW: 14.8 % (ref 11.5–15.5)
RDW: 16.4 % — ABNORMAL HIGH (ref 11.5–15.5)
WBC: 3.4 10*3/uL — ABNORMAL LOW (ref 4.0–10.5)
WBC: 2.8 10*3/uL — ABNORMAL LOW (ref 4.0–10.5)
WBC: 3.5 10*3/uL — ABNORMAL LOW (ref 4.0–10.5)

## 2010-04-06 LAB — CARDIAC PANEL(CRET KIN+CKTOT+MB+TROPI)
CK, MB: 4.8 ng/mL — ABNORMAL HIGH (ref 0.3–4.0)
CK, MB: 8.9 ng/mL — ABNORMAL HIGH (ref 0.3–4.0)
Relative Index: 1.6 (ref 0.0–2.5)
Total CK: 640 U/L — ABNORMAL HIGH (ref 7–232)
Troponin I: 0.03 ng/mL (ref 0.00–0.06)

## 2010-04-06 LAB — URINE MICROSCOPIC-ADD ON

## 2010-04-06 LAB — BASIC METABOLIC PANEL
BUN: 17 mg/dL (ref 6–23)
Chloride: 101 mEq/L (ref 96–112)
Creatinine, Ser: 1.21 mg/dL (ref 0.4–1.5)
Creatinine, Ser: 1.46 mg/dL (ref 0.4–1.5)
GFR calc Af Amer: 56 mL/min — ABNORMAL LOW (ref >60)
GFR calc non Af Amer: 57 mL/min — ABNORMAL LOW (ref >60)
Potassium: 3.9 mEq/L (ref 3.5–5.1)
Potassium: 4 mEq/L (ref 3.5–5.1)
Sodium: 136 mEq/L (ref 135–145)

## 2010-04-06 LAB — POCT CARDIAC MARKERS: Myoglobin, poc: 239 ng/mL (ref 12–200)

## 2010-04-06 LAB — COMPREHENSIVE METABOLIC PANEL
AST: 28 U/L (ref 0–37)
Albumin: 3.5 g/dL (ref 3.5–5.2)
Albumin: 3.6 g/dL (ref 3.5–5.2)
Alkaline Phosphatase: 43 U/L (ref 39–117)
BUN: 25 mg/dL — ABNORMAL HIGH (ref 6–23)
BUN: 23 mg/dL (ref 6–23)
CO2: 26 mEq/L (ref 19–32)
Calcium: 9.4 mg/dL (ref 8.4–10.5)
Chloride: 102 mEq/L (ref 96–112)
Glucose, Bld: 120 mg/dL — ABNORMAL HIGH (ref 70–99)
Potassium: 4.3 mEq/L (ref 3.5–5.1)
Potassium: 3.7 mEq/L (ref 3.5–5.1)
Sodium: 140 mEq/L (ref 135–145)
Sodium: 136 mEq/L (ref 135–145)
Total Protein: 6.6 g/dL (ref 6.0–8.3)

## 2010-04-06 LAB — CK TOTAL AND CKMB (NOT AT ARMC)
CK, MB: 2 ng/mL (ref 0.3–4.0)
Total CK: 129 U/L (ref 7–232)

## 2010-04-06 LAB — PROTIME-INR
INR: 1.01 (ref 0.00–1.49)
Prothrombin Time: 13.5 seconds (ref 11.6–15.2)
Prothrombin Time: 13.3 seconds (ref 11.6–15.2)

## 2010-04-06 LAB — URINALYSIS, ROUTINE W REFLEX MICROSCOPIC
Glucose, UA: NEGATIVE mg/dL
Glucose, UA: NEGATIVE mg/dL
Ketones, ur: NEGATIVE mg/dL
Protein, ur: NEGATIVE mg/dL
pH: 6 (ref 5.0–8.0)
pH: 6 (ref 5.0–8.0)

## 2010-04-06 LAB — SURGICAL PCR SCREEN
MRSA, PCR: NEGATIVE
Staphylococcus aureus: POSITIVE — AB

## 2010-04-06 LAB — DIFFERENTIAL
Eosinophils Relative: 6 % — ABNORMAL HIGH (ref 0–5)
Lymphocytes Relative: 13 % (ref 12–46)
Lymphs Abs: 0.5 10*3/uL — ABNORMAL LOW (ref 0.7–4.0)
Monocytes Absolute: 0.4 10*3/uL (ref 0.1–1.0)

## 2010-04-06 LAB — LIPID PANEL: VLDL: 14 mg/dL (ref 0–40)

## 2010-04-06 LAB — APTT: aPTT: 28 seconds (ref 24–37)

## 2010-04-06 LAB — HEMOGLOBIN A1C: Mean Plasma Glucose: 126 mg/dL

## 2010-04-07 ENCOUNTER — Ambulatory Visit (HOSPITAL_COMMUNITY): Payer: Medicare Other

## 2010-04-07 ENCOUNTER — Other Ambulatory Visit: Payer: Self-pay | Admitting: Thoracic Surgery

## 2010-04-07 ENCOUNTER — Ambulatory Visit (HOSPITAL_COMMUNITY)
Admission: RE | Admit: 2010-04-07 | Discharge: 2010-04-07 | Disposition: A | Discharge status: Home / Self Care | Payer: Medicare Other | Source: Ambulatory Visit | Attending: Thoracic Surgery | Admitting: Thoracic Surgery

## 2010-04-07 DIAGNOSIS — C3410 Malignant neoplasm of upper lobe, unspecified bronchus or lung: Secondary | ICD-10-CM | POA: Insufficient documentation

## 2010-04-07 DIAGNOSIS — Z01818 Encounter for other preprocedural examination: Secondary | ICD-10-CM | POA: Insufficient documentation

## 2010-04-07 DIAGNOSIS — Z01810 Encounter for preprocedural cardiovascular examination: Secondary | ICD-10-CM | POA: Insufficient documentation

## 2010-04-07 DIAGNOSIS — C3490 Malignant neoplasm of unspecified part of unspecified bronchus or lung: Secondary | ICD-10-CM

## 2010-04-07 DIAGNOSIS — Z01812 Encounter for preprocedural laboratory examination: Secondary | ICD-10-CM | POA: Insufficient documentation

## 2010-04-09 LAB — CULTURE, RESPIRATORY W GRAM STAIN: Culture: NO GROWTH

## 2010-04-20 NOTE — Op Note (Signed)
  NAME:  Christopher Cook, Christopher Cook NO.:  [id number]  MEDICAL RECORD NO.:  [id number]          PATIENT TYPE:  LOCATION:                                 FACILITY:  PHYSICIAN:  Ines Bloomer, M.D. DATE OF BIRTH:  06-18-1921  DATE OF PROCEDURE: DATE OF DISCHARGE:                              OPERATIVE REPORT   PREOPERATIVE DIAGNOSIS:  Status post left upper lobectomy for non-small cell lung cancer, right upper lobe mass.  POSTOPERATIVE DIAGNOSIS:  Well-differentiated adenocarcinoma, right upper lobe.  OPERATION PERFORMED:  Fiberoptic bronchoscopy with electromagnetic navigational bronchoscopy.  SURGEON:  Ines Bloomer, MD  General anesthesia.  After adequate general anesthesia, the patient was placed in the electromagnetic field and the bronchoscope was passed through the endotracheal tube.  The carina was midline.  The left mainstem was normal.  The left upper lobe had a well-healed stump.  The left lower lobe was normal.  On the right side, the patient had a right upper lobe, right middle lobe, and right lower lobe orifices were normal.  Into the working channel was passed the edge catheter with the locatable guide and automatic registration was then done using the edge catheter.  After this had been done, we then switched to the navigation node and navigated the scope into the right upper lobe and then navigation of the catheter following the line out with an 0.5 to 1 cm of the lesion, and we could see it easily on fluoro.  Locatable guide was removed, and through the edge catheter we then passed the needle brush doing 2 aspirations and then an aspiration cytology using the Wang needle. Next, we passed the biopsy forceps and did five biopsies between every 2 or 3 biopsies.  We then put the locatable guide back in just to be sure we were on target.  We did move around the locatable guide, located the extended working channel a little bit in order to biopsy  from several different areas.  After that was done, we then passed the needle brush back in and did two more brushings.  Finally, we did a BAL and we removed the catheter.  The rapid on-site evaluation revealed a well- differentiated adenocarcinoma.  The patient was then returned to the recovery room in stable condition.     Ines Bloomer, M.D.     DPB/MEDQ  D:  04/07/2010  T:  04/08/2010  Job:  [id number]  Electronically Signed by Jovita Gamma M.D. on 04/20/2010 02:14:33 PM

## 2010-05-05 ENCOUNTER — Other Ambulatory Visit: Payer: Self-pay | Admitting: Radiation Oncology

## 2010-05-05 ENCOUNTER — Ambulatory Visit: Payer: Medicare Other | Attending: Radiation Oncology | Admitting: Radiation Oncology

## 2010-05-05 DIAGNOSIS — E7800 Pure hypercholesterolemia, unspecified: Secondary | ICD-10-CM | POA: Insufficient documentation

## 2010-05-05 DIAGNOSIS — G20A1 Parkinson's disease without dyskinesia, without mention of fluctuations: Secondary | ICD-10-CM | POA: Insufficient documentation

## 2010-05-05 DIAGNOSIS — E049 Nontoxic goiter, unspecified: Secondary | ICD-10-CM | POA: Insufficient documentation

## 2010-05-05 DIAGNOSIS — C3410 Malignant neoplasm of upper lobe, unspecified bronchus or lung: Secondary | ICD-10-CM | POA: Insufficient documentation

## 2010-05-05 DIAGNOSIS — C3490 Malignant neoplasm of unspecified part of unspecified bronchus or lung: Secondary | ICD-10-CM

## 2010-05-05 DIAGNOSIS — Z8673 Personal history of transient ischemic attack (TIA), and cerebral infarction without residual deficits: Secondary | ICD-10-CM | POA: Insufficient documentation

## 2010-05-05 DIAGNOSIS — Z510 Encounter for antineoplastic radiation therapy: Secondary | ICD-10-CM | POA: Insufficient documentation

## 2010-05-05 DIAGNOSIS — G20 Parkinson's disease: Secondary | ICD-10-CM | POA: Insufficient documentation

## 2010-05-05 DIAGNOSIS — I2510 Atherosclerotic heart disease of native coronary artery without angina pectoris: Secondary | ICD-10-CM | POA: Insufficient documentation

## 2010-05-11 ENCOUNTER — Encounter (HOSPITAL_COMMUNITY): Payer: Self-pay

## 2010-05-11 ENCOUNTER — Encounter (HOSPITAL_COMMUNITY)
Admission: RE | Admit: 2010-05-11 | Discharge: 2010-05-11 | Disposition: A | Discharge status: Home / Self Care | Payer: Medicare Other | Source: Ambulatory Visit | Attending: Radiation Oncology | Admitting: Radiation Oncology

## 2010-05-11 ENCOUNTER — Other Ambulatory Visit: Payer: Self-pay | Admitting: Radiation Oncology

## 2010-05-11 DIAGNOSIS — J984 Other disorders of lung: Secondary | ICD-10-CM | POA: Insufficient documentation

## 2010-05-11 DIAGNOSIS — Z7902 Long term (current) use of antithrombotics/antiplatelets: Secondary | ICD-10-CM | POA: Insufficient documentation

## 2010-05-11 DIAGNOSIS — N400 Enlarged prostate without lower urinary tract symptoms: Secondary | ICD-10-CM | POA: Insufficient documentation

## 2010-05-11 DIAGNOSIS — C3490 Malignant neoplasm of unspecified part of unspecified bronchus or lung: Secondary | ICD-10-CM

## 2010-05-11 DIAGNOSIS — Z79899 Other long term (current) drug therapy: Secondary | ICD-10-CM | POA: Insufficient documentation

## 2010-05-11 HISTORY — DX: Malignant neoplasm of unspecified part of unspecified bronchus or lung: C34.90

## 2010-05-11 LAB — GLUCOSE, CAPILLARY: Glucose-Capillary: 108 mg/dL — ABNORMAL HIGH (ref 70–99)

## 2010-05-11 MED ORDER — FLUDEOXYGLUCOSE F - 18 (FDG) INJECTION: 18.4000 | Freq: Once | INTRAVENOUS | Status: DC | PRN

## 2010-05-11 MED ORDER — FLUDEOXYGLUCOSE F - 18 (FDG) INJECTION
18.4000 | Freq: Once | INTRAVENOUS | Status: AC | PRN
  Administered 2010-05-11: 18.4 via INTRAVENOUS

## 2010-05-20 LAB — AFB CULTURE WITH SMEAR (NOT AT ARMC)

## 2010-05-20 NOTE — Discharge Summary (Signed)
[place]. [facility]  [name], Christopher Cook                        MRN: [id number] Adm. Date:  [id number] Disc. Date: [id number] Attending:  Robynn Pane CC:         Christopher Cook, M.D.   Discharge Summary  ADMITTING DIAGNOSES: 1. Chest pain, rule out coronary insufficiency, rule out restenosis. 2. Coronary artery disease status post percutaneous transluminal coronary    angioplasty to in-stent restenosis to ramus branch in May 2002. 3. Uncontrolled hypertension secondary to noncompliance. 4. Hypercholesterolemia. 5. History of tobacco abuse.  FINAL DIAGNOSES: 1. Stable angina.  Negative Persantine Cardiolite. 2. Coronary artery disease status post percutaneous transluminal coronary    angioplasty, stenting to left anterior descending, ramus, and left    circumflex in September 2000, status post percutaneous transluminal    coronary angioplasty to in-stent restenosis to ramus branch in May 2002. 3. Hypertension. 4. Hypercholesterolemia. 5. History of tobacco abuse. 6. Questionable left upper lobe nodule.  DISCHARGE HOME MEDICATIONS: 1. Toprol XL 25 mg one tablet daily. 2. Nitro-Dur 0.4 mg per hour, apply to chest wall in a.m., off at night. 3. Altace 10 mg one capsule daily. 4. Lipitor 10 mg one tablet daily. 5. Baby aspirin 81 mg one tablet daily. 6. Plavix 75 mg one tablet daily with food. 7. Protonix 40 mg one tablet daily half hour before breakfast. 8. Nitrostat 0.4 mg sublingual, use as directed.  ACTIVITY:  As tolerated.  DIET:  Low salt, low cholesterol.  FOLLOW-UP:  Will refer to pulmonary as outpatient for evaluation of left upper lobe questionable nodule.  Follow up with me in one week.  Follow up with Dr. Shana Chute in two weeks.  CONDITION AT DISCHARGE:  Stable.  BRIEF HISTORY AND HOSPITAL COURSE:  [name] is a 77 year old black male with past medical history significant for coronary artery disease status post  PTCA and stenting to LAD, left circumflex, and ramus in September 2000, status post PTCA to in-stent restenosis of ramus in May 2002, hypertension, hypercholesterolemia, history of CVA, tobacco abuse who came to the ER complaining of vague chest pain off and on every 30 minutes lasting few seconds to a minute.  Grade 3/10 without associated symptoms.  Patient denies nausea, vomiting, diaphoresis.  Denies palpitations, lightheadedness, or syncope.  Patient states he took his blood pressure and was noted to have a blood pressure of 174/130.  He took one sublingual nitroglycerin and came to the ER.  Patient presently denies chest pain, nausea, vomiting, or diaphoresis.  He denies fever, chills, or cough and denies any relation of chest pain to food, breathing, or cough.  He states he ran out of Nitro-Dur patches recently and stopped taking it.  PAST MEDICAL HISTORY:  As above.  PAST SURGICAL HISTORY:  Status post right shoulder surgery in 1970.  Status post PTCA and stent to the LAD, left circumflex, and ramus in September 2000. Status post PTCA to in-stent restenosis to ramus in May 2002.  SOCIAL HISTORY:  He is married, retired.  He smoked a half pack per day x 15 years, quit 45 years ago.  Worked at [employer], born [place]  FAMILY HISTORY:  Father died of MI at age 92.  Mother died of old age.  MEDICATIONS AT HOME:  He was taking all heart medications. 1. Lorazepate 7.5 mg daily. 2. Lipitor 10 mg p.o.  q.d. 3. Plavix 75 mg p.o. q.d. 4. Protonix 40 mg p.o. q.d. 5. Zyrtec 10 mg p.r.n. 6. Chlorthalidone one tablet daily. 7. Herbal pills.  PHYSICAL EXAMINATION:  GENERAL:  He was alert, awake, and oriented x 3, in no acute distress.  VITAL SIGNS:  Blood pressure was 158/88, pulse was 64.  HEENT:  Conjunctivae were pink.  NECK:  Supple.  No rigidity, no bruit.  LUNGS:  Clear to auscultation without rhonchi or rales.  CARDIOVASCULAR:  S1, S2 was normal.   There was an S4 gallop in the apex. ABDOMEN:  Soft.  Bowel sounds were present.  Nontender.  EXTREMITIES:  No clubbing, cyanosis, or edema.  LABORATORY DATA:  EKG showed normal sinus rhythm with no acute ischemic changes.  His hemoglobin was 14.7, hematocrit 43.1.  Two sets of CPKs were 167 and MB 3.1 and the latter index was 1.9; second set CK was 160 and MB 2.4.  Troponin -- two sets were also negative.  BUN was 23, creatinine 1.4.  Repeat BUN was 21, creatinine 1.0.  Potassium was 3.5.  Lipid panel is still pending.  BRIEF HISTORY AND HOSPITAL COURSE:  Patient was admitted to telemetry unit. MI was ruled out by serial enzymes and EKG.  Patient did not have any episodes of chest pain during the hospital stay.  Patient underwent Persantine Cardiolite today which showed no evidence of reversible ischemia with ejection fraction of 66%.  Patients blood pressure is fairly well controlled. Discussed with patient regarding left upper lobe nodule versus scar on CT of the chest and referred to pulmonary doctor to which patient agrees as outpatient.  Patient will be discharged home on the above medications and will be followed up in my office in one week and Dr. Ronie Spies office in two weeks. DD:  07/15/00 TD:  07/15/00 Job: [id number]

## 2010-05-20 NOTE — Cardiovascular Report (Signed)
[place]. [facility]  [name], Christopher Cook Visit Number: [id number] MRN: [id number]          Service Type: CAT Location: [id number] Attending Physician:  Robynn Pane Dictated by:   Eduardo Osier Sharyn Lull, M.D. Proc. Date: 05/21/01 Admit Date:  05/21/2001 Discharge Date: 05/22/2001   CC:         Osvaldo Shipper. Spruill, M.D.  [facility] Cath Lab   Cardiac Catheterization  PROCEDURE:  Left cardiac catheterization with selective left and right coronary angiography, left ventriculography via right groin using Judkins technique.  INDICATION FOR PROCEDURE:  Christopher Cook is a 77 year old black male with a past medical history significant for coronary artery disease, status post multiple PTCA and stenting to left circumflex, ramus and LAD in September of 2000 and had PTCA to mid-ramus in May of 2002, history of hypertension, hypercholesterolemia, history of tobacco abuse, positive family history of coronary artery disease, who complains of vague chest pain off and on associated with feeling weak and tired when under stress without any associated symptoms.  The patient denies nausea, vomiting or diaphoresis; denies PND, orthopnea or leg swelling; denies palpitations, lightheadedness or syncope.  The patient underwent Persantine Cardiolite on May 15, 2001 which showed very small area of reversible ischemia in the inferolateral wall with ejection fraction of 65%.  Discussed with patient various options of treatment, i.e., medical, as there is only a very small area of ischemia, versus invasive, its risks and benefits, i.e., death, MI, stroke, need for emergency CABG, risk of restenosis, ______ consented for PCI.  DESCRIPTION OF PROCEDURE:  After obtaining the informed consent, the patient was brought to the cath lab and was placed on fluoroscopy table.  Right groin was prepped and draped in usual fashion.  Xylocaine 2% was used for local anesthesia in  the right groin.  With the help of a thin-walled needle, 6-French arterial sheath was placed.  The sheath was aspirated and flushed. Next, 6-French left Judkins catheter was advanced over the wire under fluoroscopic guidance up to the ascending aorta.  Wire was pulled out.  The catheter was aspirated and connected to the manifold.  Catheter was further advanced and engaged into the coronary ostium.  Multiple views of the left system were taken.  Next, the catheter was disengaged and was pulled out over the wire and was replaced with a 6-French right Judkins catheter, which was advanced over the wire under fluoroscopic guidance up to the ascending aorta. Wire was pulled out.  The catheter was aspirated and connected to the manifold.  Catheter was further advanced and engaged into right coronary ostium.  Multiple views of the right system were taken.  Next, the catheter was disengaged and was pulled out over the wire and was replaced with a 6-French pigtail catheter, which was advanced over the wire under fluoroscopic guidance up to the ascending aorta.  Wire was pulled out.  The catheter was aspirated and connected to the manifold.  Catheter was further advanced across the aortic valve into the LV.  LV pressures were recorded.  Next, left ventriculography was done in 30 degree RAO position.  Post-angiographic pressures were recorded from LV and then pullback pressures were recorded from the aorta.  There was no gradient across the aortic valve.  Next, the pigtail catheter was pulled out over the wire, sheaths were aspirated and flushed.  FINDINGS:  Left ventricle showed good left ventricular systolic function. Left main was short but was  patent.  Left anterior descending had 10-15% in-stent restenosis in the proximal portion and there was focal new 70-75% midstenosis and 75-80% sequential distal stenosis close to apex.  The ramus had 20% in-stent restenosis.  Diagonal #1 had 70-80%  stenosis proximally; the vessel was less than 1.5 mm.  Left circumflex has 20-30% proximal in-stent restenosis.  Obtuse marginal #1 and obtuse marginal #2 were very small, which were patent.  Right coronary artery was non-dominant, which was patent.  INTERVENTIONAL PROCEDURE:  Successful percutaneous transluminal coronary angioplasty to distal and mid left anterior descending was done using 2.5 x 15.0-mm long Maverick balloon for pre-dilatation and then 2.5 x 18.0-mm long Cypher drug-eluting stent was deployed in distal LAD at 11 atmospheres of pressure, which was fully expanded, going up to 17 atmospheres of pressure.  Lesion was dilated from 75-80% to less than 10% residual with excellent TIMI grade 3 distal flow without evidence of dissection or distal embolization, and 3.0 x 18.0-mm long Cypher drug-eluting stent was deployed in the mid-LAD at 11 atmospheres of pressure, which was fully expanded, going up to 15 atmospheres of pressure.  Lesion was dilated from 70-75% to 0% residual with excellent TIMI grade 3 distal flow without evidence of dissection or distal embolization.  The patient received weight-based heparin and ReoPro during the procedure.  The patient tolerated the procedure well.  There were no complications.  The patient was transferred to recovery room in stable condition. Dictated by:   Eduardo Osier Sharyn Lull, M.D. Attending Physician:  Robynn Pane DD:  05/21/01 TD:  05/23/01 Job: [id number]

## 2010-05-20 NOTE — Cardiovascular Report (Signed)
[place]. [facility]  [name], Christopher Cook                        MRN: [id number] Proc. Date: 05/31/00 Adm. Date:  [id number] Attending:  Rinaldo Cloud N                        Cardiac Catheterization  PROCEDURE: 1. Left cardiac catheterization with selective left and right coronary    angiography, left ventriculography via right groin using Judkins technique. 2. Successful percutaneous transluminal coronary angioplasty to ramus branch    using 3.0- x 10-mm long cutting balloon.  INDICATION FOR PROCEDURE:  Mr. Dunlevy is a 77 year old black male with a past medical history significant for coronary artery disease status post multiple PTCA and stenting in September 2000, chronic stable angina, hypertension, hypercholesterolemia, tobacco abuse, positive family history of coronary artery disease, complains of recurrent retrosternal chest heaviness relieved with sublingual nitroglycerin and rest.  Last syncopal episode associated with shortness of breath, nausea, and feeling weak and tired.  Patient underwent Persantine Cardiolite in September 2001 which showed mild ischemia in the inferobasal aspect of the lateral wall with ejection fraction of 58%.  The patient opted for medical management, but due to recurrent chest pain requiring more nitrate, patient consented now for PCI.  PAST MEDICAL HISTORY:  As above.  Also patient had history of CVA approximately four years ago.  PAST SURGICAL HISTORY:  He had right shoulder surgery in 1970s.  SOCIAL HISTORY:  He is married and has three children.  Retired.  Smoked half pack per day for 15 years.  Quit 45 years ago.  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  Born in [place], [place].  Most of his life, he lived in [place] and moved to [place] a few years ago.  FAMILY HISTORY:  Father died of MI at the age of 29.  Mother died of old age. He has three brothers and two sisters.  He does not  know medical history of all of them.  MEDICATIONS:  He is on ______, aspirin, Lipitor 10 mg p.o. q.d., Nitro-Dur 0.4 mg per hour q.d., Toprol XL 25 p.o. q.d., Nitrostat sublingual p.r.n.  PHYSICAL EXAMINATION:  On examination, he is alert, awake, oriented x 3, in no acute distress.  VITAL SIGNS:  Blood pressure was 140/80.  Pulse was 68.  HEENT:  Conjunctiva was pink.  NECK:  Supple.  No JVD, no bruits.  LUNGS:  Clear to auscultation without rhonchi or rales.  CARDIOVASCULAR EXAM:  S1, S2 was normal.  There was no S3, gallop.  ABDOMEN:  Soft.  Bowel sounds were present.  Nontender.  EXTREMITIES:  There was no clubbing, cyanosis, or edema.  IMPRESSION:  ______ angina, exacerbated angina, positive Persantine Cardiolite, coronary artery disease status post multivessel PTCA and stenting in the past, hypertension, hypercholesterolemia, remote history of tobacco abuse, positive family history of coronary artery disease.  Discussed with patient at length regarding left catheterization, possible PCI, its risks, i.e., death, MI, stroke, need for emergency CABG, risk of restenosis, local vascular complications, etc., and consented for the procedure.  DESCRIPTION OF PROCEDURE:  After obtaining the informed consent, the patient was brought to the catheterization lab and was placed on the fluoroscopy table.  The right groin was prepped and draped in the usual fashion. Xylocaine, 2%, was used for local anesthesia in the right groin.  With the help of  a thin-walled needle, a 6-French arterial sheath was placed.  The sheath was aspirated and flushed.  Next, the 6-French left Judkins catheter was advanced over the wire under fluoroscopic guidance up to the ascending aorta.  The wire was pulled out.  The catheter was aspirated and connected to the manifold.  The catheter was further advanced and engaged into the left coronary ostium.  Multiple views of the left system were taken.  Next,  the catheter was disengaged and was pulled out over the wire and was replaced with a 6-French right Judkins catheter which was advanced over the wire under fluoroscopic guidance up to the ascending aorta.  The wire was pulled out. The catheter was aspirated and connected to the manifold.  The catheter was further advanced and engaged into the right coronary ostium.  A single view of the right coronary artery was obtained.  Next, the right Judkins catheter was pulled out over the wire and was replaced with a 6-French pigtail catheter which was advanced over the wire under fluoroscopic guidance up to the ascending aorta.  The wire was pulled out.  The catheter was aspirated and connected to the manifold.  The catheter was further advanced across the aortic valve into the LV.  LV pressures were recorded.  Next, left ventriculography was done in 30-degree RAO position.  Post angiographic pressures were recorded from LV, and then pullback pressures were recorded from the aorta.  There was no gradient across the aortic valve.  Next, the pigtail catheter was pulled out over the wire.  Sheaths were aspirated and flushed.  FINDINGS:  LV:  Patient has good LV systolic function.  1. The left main was short which was patent. 2. The LAD has 10-15% in-stent restenosis proximally and 40-50% mid stenosis    and 70-80% distal stenosis near the apex. 3. The left circumflex has 30% proximal in-stent restenosis and 40% mid and    distal junctions stenosis and has 80% distal stenosis.  Vessel size is less    than 2 mm distally. 4. The ramus has 70-80% in-stent restenosis. 5. The RCA is nondominant which is small which is patent.  INTERVENTIONAL PROCEDURE:  Successful PTCA to proximal ramus was done using 3.0- x 10-mm long cutting balloon with up to six atmospheres of pressure.  The lesion was dilated from 70-80% to less than 10% residual with excellent TIMI grade 3 distal flow without evidence of  dissection or distal embolization. The patient received weight-based heparin during the procedure.  The patient tolerated the procedure well.  There were no complications.  The patient was  transferred to the recovery room in stable condition. DD:  05/31/00 TD:  05/31/00 Job: [id number]

## 2010-05-20 NOTE — Op Note (Signed)
NAMEQUAN, CYBULSKI NO.:  [id number]   MEDICAL RECORD NO.:  [id number]          PATIENT TYPE:  INP   LOCATION:  2899                         FACILITY:  [facility]   PHYSICIAN:  Hilda Lias, M.D.   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 Status post anterior cervical decompression.   POSTOPERATIVE DIAGNOSES:  Right ulnar neuropathy with compression at the  level of the elbow. Status post anterior cervical decompression.   PROCEDURE:  Decompression of the right ulnar nerve.   SURGEON:  Hilda Lias, M.D.   ANESTHESIA:  General.   HISTORY:  [name] is admitted because of weakness of the right hand  mostly intrinsic. Nerve conduction test showed that indeed he has a severe  case of ulnar neuropathy on the right side. Surgery was advised. The patient  knew all the risks which are no improvement whatsoever, need for further  surgery, infection, bleeding.   DESCRIPTION OF PROCEDURE:  The patient was taken to the operating room and  after intubation the right upper extremity was scrubbed with Betadine.  Drapes were applied. A longitudinal incision about with the center at the  level of the olecranon and distal and proximal about 3 inches was made  through the skin and subcutaneous tissue. We had to do our dissection  proximal until we found the ulnar nerve. From then on, we skipped the center  of the incision and we went distal until we found the ulnar nerve right at  the level of entering the muscle of the forearm. The nerves at this level  were completely normal. Then our dissection was carried out to the center  until we found that indeed there was quite a bit of adhesions of the ulnar  nerve. It was thick and it has no abnormal coloration proximal to distal.  With dissection,  we were able to do a lysis of adhesions and we were able to  free the nerve completely from the groove of the olecranon. There were two  branches that were cut to allow to transfer the nerve. After having good  decompression, the area was irrigated, hemostasis was done with bipolar.  Then the nerve was moved anterior through the groove. The groove was closed  using Vicryl between the fascia. We have a good  __________ space proximal to distal. After position was achieved, the  subcutaneous tissue was closed with 3-0 Vicryl. The skin was closed using  Steri-Strips. The patient did well. He is going to go to the recovery room  and be discharged either today or tomorrow.      EB/MEDQ  D:  06/07/2004  T:  06/07/2004  Job:  [id number]   cc:   Pramod P. Pearlean Brownie, MD  Fax: [phone]   Osvaldo Shipper. Spruill, M.D.  [address] [id number]  [place]  [place] [address]  Fax: [phone]

## 2010-05-20 NOTE — Discharge Summary (Signed)
NAME:  Christopher Cook, Christopher Cook NO.:  [id number]   MEDICAL RECORD NO.:  [id number]          PATIENT TYPE:  INP   LOCATION:  3305                         FACILITY:  [facility]   PHYSICIAN:  Ines Bloomer, M.D. DATE OF BIRTH:  [birth year]   DATE OF ADMISSION:  12/30/2004  DATE OF DISCHARGE:  01/06/2005                                 DISCHARGE SUMMARY   HISTORY OF THE PRESENT ILLNESS:  The patient is an 77 year old male who was  found to have a left upper lobe lung lesion with some calcification in the  mediastinal nodes and was referred to Dr. Edwyna Shell for further evaluation and  treatment.  A PET scan was done and this showed an SUV that had evidence of  either cancer or some type of granulomatous disease.  The patient underwent  bronchoscopy which was negative, but then a needle biopsy subsequently  revealed adenocarcinoma.  He was admitted this hospitalization for  resection.   PAST MEDICAL HISTORY:  Coronary artery disease, status post multiple stents.  He is a patient of Dr. Shana Chute.  Other diagnoses include hypertension.   MEDICATIONS PRIOR TO ADMISSION:  1.  Toprol 25 mg daily.  2.  Zocor 40 mg daily.  3.  Caduet 5/10 once daily.  4.  Plavix 75 mg daily, which had been stopped.  5.  Ambien 10 mg at night.  6.  Nitroglycerin p.r.n.  7.  Lorazepam 10 mg daily.  8.  Nasonex p.r.n.  9.  Protonix 40 mg daily.  10. Mevacor 20 mg daily.  11. Lozol 2.5 mg daily.   FAMILY HISTORY, SOCIAL HISTORY, REVIEW OF SYMPTOMS, AND PHYSICAL  EXAMINATION:  Please see the history and physical done at the time of  admission.   HOSPITAL COURSE:  The patient was admitted electively and on December 30, 2004 he was taken to the operating room where he underwent a left  thoracotomy and left upper lobe lobectomy with lymph node dissection.  The  patient tolerated the procedure well and was taken to the surgical intensive  care unit.   POSTOPERATIVE HOSPITAL COURSE:  The patient initially  had some minor  difficulties with volume requiring replacement.  Additionally, he had an  acute blood loss anemia, but this has stabilized.  He did have some mild  elevation of his liver function studies immediately postoperatively.  The  total bilirubin rose to 1.3, AST to 57.  This was on 01/02/2005.  The  patient also had significant elevation of his CK and troponin-I with a peak  troponin of 0.27 and a peak CK of 2511 with peak CK-MB of 12.1, which showed  a relative index of 0.5.  The patient has remained hemodynamically stable.  His incision is healing well without signs of infection.  He has tolerated a  gradual increase in activity commensurate for the level of postoperative  convalescence.  All routine lines, monitors and drainage devices have been  discontinued in the standard fashion.  He has been seen in oncology  consultation.  He has been staged as a IB non-small cell lung carcinoma,  adenocarcinoma  type.  The patient will undergo follow-up discussions for  possibility of adjuvant chemotherapy for this stage disease.  This will be  arranged through Dr. Asa Lente office and an appointment has been made.  Overall, his status is felt to be adequate and stable for tentative  discharge in the morning of January 06, 2005 pending morning round  reevaluation.   Medications on discharge are as preoperatively.  Additionally for pain Tylox  one every six hours as needed.   INSTRUCTIONS:  The patient received written instructions in regard to  medications, activity, diet, wound care and followup.  Followup will include  Dr. Edwyna Shell on January 11, 2005 with a chest x-ray from the [facility].   FINAL DIAGNOSIS:  Adenocarcinoma, stage IB, now status post left upper lobe  lobectomy.   OTHER DIAGNOSES:  Include:  1.  Acute blood loss anemia, stable.  Most recent hemoglobin and hematocrit      dated January 02, 2005 are 10.1 and 29.4 respectively.  2.  Other diagnoses are  as previously listed per the history.   CONDITION ON DISCHARGE:  Stable and improving.      Rowe Clack, P.A.-C.    ______________________________  Ines Bloomer, M.D.    Sherryll Burger  D:  01/05/2005  T:  01/05/2005  Job:  [id number]   cc:   Lajuana Matte, MD  Fax: [phone]   Osvaldo Shipper. Spruill, M.D.  Fax: [phone]

## 2010-05-20 NOTE — Consult Note (Signed)
NAME:  Christopher Cook, Christopher Cook               ACCOUNT NO.:  [id number]   MEDICAL RECORD NO.:  [id number]          PATIENT TYPE:  INP   LOCATION:  4729                         FACILITY:  [facility]   PHYSICIAN:  Pramod P. Pearlean Brownie, MD    DATE OF BIRTH:  June 18, 1921   DATE OF CONSULTATION:  10/13/2005  DATE OF DISCHARGE:                                   CONSULTATION   REASON FOR REFERRAL:  Dizziness.   HISTORY OF PRESENT ILLNESS:  Christopher Cook is an59 year old African-American  gentleman who has developed sudden onset of presyncopal episodes and  dizziness in the upright position for the last one week.  He states last  Tuesday, he nearly fainted three times when in the upright position.  He was  hospitalized for evaluation on October 08, 2005, and was found to be  significantly orthostatic with a supine blood pressure of up to 210/110  which dropped nearly 40-50 points when he stood up.  The patient has been  hydrated and was found to be euvolemic and he had his spell of orthostatic  and dizzy.  His cortisol levels have also been fine.  The patient denies any  focal neurological symptoms when he stands up but states that he just feels  weak all over and is going to pass out and he has to sit down.  He denies  any headache, blurred vision, slurred speech, double vision, focal extremity  weakness or numbness.  His past neurological history significant for  __________  in 2005 for which he saw Dr. Thad Ranger.  He has subsequently seen  me in the office for symptoms of hand numbness and weakness.  He was found  to have ulnar entrapment nerve at the elbow for which he underwent surgery  by Dr. Jeral Fruit.  He also underwent C-spine surgery for cervical myelopathy as  well.   PAST MEDICAL HISTORY:  Past medical history is significant for borderline  diabetes, hypertension, lung cancer status post lobectomy in December 2006.  He did not get any chemotherapy or radiation.  Also history of coronary  artery disease,  hyperlipidemia.   PAST SURGICAL HISTORY:  Left lung lobectomy for cancer in December 2006.   SOCIAL HISTORY:  The patient is married, lives at home, is retired; does not  smoke, quit in 1970.   REVIEW OF SYSTEMS:  Positive for some shortness of breath, dyspnea,  generalized weakness and dizziness, presyncopal symptoms.   PHYSICAL EXAMINATION:  GENERAL:  Reveals an elderly African-American  gentleman who is at present not in distress.  VITAL SIGNS:  He is afebrile with pulse rate of 78 and regular, blood  pressure is 166/92 laying down and 88/44 in the standing position.  Distal  pulses are well felt.  HEENT/NECK:  Head is nontraumatic.  Neck is supple without bruit.  ENT exam  is unremarkable.  CARDIAC:  No murmur or gallop.  LUNGS:  Clear to auscultation.  ABDOMEN:  Soft, nontender.  NEUROLOGIC:  Pleasant, awake, alert, cooperative.  There is no aphasia,  apraxia, or dysarthria.  Pupils are equal and reactive.  EOM are full range  without nystagmus.  __________ .  Tongue is midline.  Motor system exam  reveals symmetric upper and lower extremity strength, tone, reflexes.  He  has some wasting in intrinsic muscles of both hands with some mild weakness  which is old.  Lower extremity strength/tone was symmetric.  Both ankle and  knee jerks are present though ankle jerks are slightly depressed.  Plantars  are downgoing.  There is no subjective sensory loss.  Position sense is  preserved.  He is able to stand up without assistance and does not complain  of dizziness.   DATA REVIEWED:  Data reviewed.  Dr. __________  present while consultation  notes were reviewed.  CT scan of the head, dated October 08, 2005, shows  stable chronic small-vessel ischemic changes and mild generalized atrophy.  No acute abnormalities noted.   IMPRESSION:  An 77 year old gentleman with new onset of orthostatic  dizziness, likely due to postural hypotension.  The exact etiology for this  is unclear at the  present time.  He does not have other autonomic symptoms  to suggest widespread generalized autonomic neuropathy.  If the patient is  euvolemic and not addisonian, may consider medication trial of Florinef.  I  would strongly recommend him to do orthostatic tolerance exercises if he has  been laying down or sitting for more than 20 minutes before getting up.  Also treatment trial of Mestinon which has been found to be recently useful  in this condition.  Begin with 30 mg three times a day for two days,  increase to 60 mg three times a day thereafter.  I have discussed side  effects with the patient and son and wife in the unit.  I doubt this  represents a generalized autonomic neuropathy due to absence of other  features.  Kindly call for questions.  Thank you for the referral.           ______________________________  Sunny Schlein. Pearlean Brownie, MD     PPS/MEDQ  D:  10/13/2005  T:  10/15/2005  Job:  [id number]   cc:   Osvaldo Shipper. Spruill, M.D.

## 2010-05-20 NOTE — Op Note (Signed)
NAMEEZRAEL, SAM NO.:  [id number]   MEDICAL RECORD NO.:  [id number]                   PATIENT TYPE:  OIB   LOCATION:  2893                                 FACILITY:  [facility]   PHYSICIAN:  Leonie Man, M.D.                DATE OF BIRTH:  Aug 23, 1921   DATE OF PROCEDURE:  04/17/2002  DATE OF DISCHARGE:                                 OPERATIVE REPORT   PREOPERATIVE DIAGNOSES:  1. Right inguinal hernia.  2. Lipoma left elbow.   POSTOPERATIVE DIAGNOSES:  1. Right inguinal hernia.  2. Lipoma left elbow.   OPERATION/PROCEDURE:  1. Right inguinal hernia repair with mesh.  2. Excision of lipoma, left elbow.   ASSISTANT:  Nurse.   ANESTHESIA:  General.   INDICATIONS FOR PROCEDURE:  The patient is a 77 year old man status post  left inguinal herniorrhaphy in the past who comes in now with right groin  pain and a bulge consistent with  right inguinal hernia.  He also has an  approximately 2.8 to 3 cm lipoma of the left elbow which has been increasing  in size and causing some problems from the standpoint of discomfort, pain  and bumping into things.  He comes to the operating room now after the  indications, the risks and the potential benefits of surgery for inguinal  herniorrhaphy and excision of the lipoma have been fully discussed.  All  questions answered and consent obtained.   DESCRIPTION OF PROCEDURE:  Following the induction of satisfactory general  anesthesia, the patient was positioned supine and the right groin is prepped  and draped to be included in the sterile operative field.  A transverse  incision is made in the lower abdominal crease on the right side, deepened  to the external oblique aponeurosis.  The external oblique aponeurosis was  then opened up through the external inguinal ring.  The spermatic cord is  elevated up with a Penrose drain.  A large hernial sac is dissected free  from the cord and dissection was carried up  to the internal ring.  This was,  as it turned out, was a very large lipomatous mass which was simply reduced  back into the retroperitoneum.  There were no other indications of any other  indirect hernias or direct.  The direct space was somewhat weakened showing  a small direct hernia.  The floor of the inguinal canal was then repaired  with a patch of polypropylene mesh sewn in with 2-0 Novofil starting at the  pubic tubercle, continuing in a running suture up along the conjoint tendon  to the internal ring and again from the pubic tubercle up along the shelving  edge of Poupart's ligament to the internal ring and this was split so as to  allow normal positioning of the spermatic cord.  The tails of the mesh were  then trimmed and sewn down behind the  cord with 2-0 Novofil sutures.  The  sponge, instrument and sharp counts were then verified.  All the areas of  dissection were checked for hemostasis and were dry.  The external oblique  aponeurosis was closed over the spermatic cord with a running suture of 2-0  Vicryl.  Scarpa's fascia and the subcutaneous fat were closed with a running  suture of 3-0 Vicryl and the skin was then closed with a running suture of 4-  0 Monocryl.  The wound was then reinforced with Steri-Strips.  Sterile  dressing was applied.   Attention was then turned to the left elbow where the patient was positioned  appropriately, prepped and draped the elbow to be included in the sterile  operative field.  I made a transversely placed incision over the left elbow  in the direction of the skin lines.  This was dissected down to the  lipomatous mass which was dissected free from the surrounding tissues.  We  then removed it in its entirety using electrocautery.  The deep tissues were  then closed with 3-0 Vicryl sutures.  Hemostasis was noted to be excellent.  Sponge and instrument counts were correct.  The skin was closed with a  running 4-0 Monocryl suture and then  reinforced with Steri-Strips and a  sterile dressing applied.  The anesthetic was then reversed.  The patient  was removed from the operating room to the recovery room in stable condition  having tolerated the procedure well.                                                Leonie Man, M.D.    PB/MEDQ  D:  04/17/2002  T:  04/17/2002  Job:  [id number]

## 2010-05-20 NOTE — Discharge Summary (Signed)
[place]  Patient:    BERLE, FITZ Visit Number: [id number] MRN: [id number]          Service Type: CAT Location: [id number] Attending Physician:  Robynn Pane Dictated by:   Eduardo Osier Sharyn Lull, M.D. Admit Date:  05/21/2001 Discharge Date: 05/22/2001   CC:         Osvaldo Shipper. Spruill, M.D.   Discharge Summary  ADMISSION DIAGNOSES:  1. Accelerated angina.  2. Post Persantine Cardiolite.  3. Coronary artery disease.  4. History of percutaneous transluminal coronary angioplasty and stenting in     the past.  5. Hypertension.  6. Hypercholesterolemia.  7. History of tobacco abuse.  8. History of cerebrovascular accident.  9. Positive family history of coronary artery disease.  DISCHARGE DIAGNOSES:  1. Accelerated angina.  2. Positive Persantine Cardiolite.  3. Status post percutaneous transluminal coronary angioplasty and stenting     mid and distal left anterior descending.  4. Coronary artery disease.  5. History of percutaneous transluminal coronary angioplasty and stenting to     left anterior descending, circumflex, and ramus in the past.  6. Hypertension.  7. Hypercholesterolemia.  8. History of tobacco abuse.  9. History of cerebrovascular accident. 10. Positive family history of coronary artery disease.  DISCHARGE HOME MEDICATIONS:  1. Toprol XL 25 mg 1 tablet daily.  2. Nitro-Dur 0.4 mg/hr applied to chest wall in the a.m., off at night.  3. Altace 10 mg 1 capsule daily.  4. Lipitor 10 mg 1 tablet daily.  5. Enteric-coated aspirin 81 mg 1 tablet daily.  6. Plavix 75 mg 1 tablet daily with food.  7. Protonix 40 mg 1 tablet daily one-half hour before breakfast.  8. Nitrostat 0.4 mg sublingual use as directed.  ACTIVITY:  Avoid heavy lifting, pushing, or pulling for 48 hours.  DIET:  Low-salt, low-cholesterol.  DISCHARGE INSTRUCTIONS:  Postangioplasty and stent instructions have been given.  FOLLOW-UP:  Follow up  with me in one week and with Dr. Shana Chute in two weeks.  CONDITION ON DISCHARGE:  Stable.  HISTORY OF PRESENT ILLNESS:  The patient is a 77 year old black male with a past medical history significant for coronary artery disease, status post PTCA and stenting in September 2000.  He had PTCA and stenting to left circumflex, ramus, and proximal LAD in September 2000, and subsequently had PTCA to mid ramus in May 2002, for in-stent restenosis, hypertension, hypercholesterolemia, and tobacco abuse, family history of coronary artery disease, complains of vague retrosternal chest pain off and on associated with feeling weak and tired.  The patient underwent Persantine Cardiolite.  The patient denies any associated symptoms.  Denies any nausea, vomiting, diaphoresis, PND, orthopnea, leg swelling.  Denies palpitations, lightheadedness, or syncope.  The patient underwent Persantine Cardiolite on May 15, 2001, which showed very small area of reversible ischemia in the inferolateral wall with EF of 65%.  I discussed with the patient various options of treatment, i.e., medical as there is only a very small area of reversible ischemia versus invasive the risks and benefits, and he consented for PCI.  PAST MEDICAL HISTORY:  As above.  Also, history of CVA in the past.  PAST SURGICAL HISTORY:  Right shoulder surgery in 1970s.  SOCIAL HISTORY:  He is married, retired.  Smoked one-half pack per day for 15 years.  Quit 45 years ago.  Worked as Merchandiser, retail at [employer].  [place]  Lives in [place].  FAMILY HISTORY:  Father  died of MI at the age of 39.  Mother died of old age.  HOME MEDICATIONS: 1. Toprol XL 25 mg p.o. q.d. 2. Nitro-Dur 0.4 mg/hr q.d. 3. Altace 10 mg p.o. q.d. 4. Lipitor 10 mg p.o. q.d. 5. Enteric-coated aspirin 81 mg p.o. q.d. 6. Plavix 75 mg p.o. q.d. 7. Protonix 40 mg p.o. q.d. 8. Nitrostat sublingual p.r.n.  PHYSICAL EXAMINATION:  GENERAL:  Alert,  awake, oriented x 3.  In no acute distress.  VITAL SIGNS:  Blood pressure 130/76, pulse 68 and regular.  HEENT:  Conjunctivae pink.  NECK:  Supple.  No JVD, no bruit.  LUNGS:  Clear to auscultation, without rhonchi or rales.  CARDIOVASCULAR:  S1, S2 normal.  There was no S3 gallop.  ABDOMEN:  Soft.  Bowel sounds are present.  Nontender.  EXTREMITIES:  There is no clubbing, cyanosis, or edema.  HOSPITAL COURSE:  The patient was an a.m. admit and underwent left cardiac catheterization, selective left and right coronary angiography, and PTCA and stenting to mid and distal RCA.  These lesions were new.  Prior PTCA and stent sites were patent.  The patient tolerated the procedure well.  There were no complications.  The patient was transferred to the recovery room in stable condition.  Post the PTCA the patient did not have any episode of anginal pain.  His CPKs postprocedure were 75.  The patient has been ambulating in the hallway without any problems.  His groin is stable.  There is no evidence of hematoma or bruit.  The patient was discharged home on the above medications and will be followed up in my office in one week and in Dr. Ronie Spies office in two weeks. Dictated by:   Eduardo Osier Sharyn Lull, M.D. Attending Physician:  Robynn Pane DD:  05/22/01 TD:  05/25/01 Job: [id number]

## 2010-05-20 NOTE — Discharge Summary (Signed)
[place]. [facility]  [name], Christopher Cook                        MRN: [id number] Adm. Date:  [id number] Disc. Date: [id number] Attending:  Robynn Cook CC:         Christopher Cook, M.D.   Discharge Summary  ADMISSION DIAGNOSES: 1. Accelerated angina, positive Persantine Cardiolite. 2. Coronary artery disease, status post multivessel percutaneous transluminal    coronary angioplasty and stent in September 2000. 3. Hypertension. 4. Hypercholesterolemia. 5. History of tobacco abuse. 6. Positive family history of coronary artery disease.  FINAL DIAGNOSES: 1. Accelerated angina, status post percutaneous transluminal coronary    angioplasty to ramus branch. 2. Coronary artery disease, status post multivessel percutaneous transluminal    coronary angioplasty and stent in September 2000. 3. Hypertension. 4. Hypercholesterolemia. 5. History of tobacco abuse. 6. Positive family history of coronary artery disease.  DISCHARGE HOME MEDICATIONS: 1. All home meds as before. 2. Enteric-coated aspirin 325 mg one tablet q.d. 3. Plavix 75 mg one tablet q.d. with food. 4. Nitro-Dur 0.4 mg per hour apply to chest wall in the a.m., off at night. 5. Nitrostat sublingual 0.4 mg use as directed. 6. Protonix 40 mg one tablet q.d. one-half hour before breakfast.  DISCHARGE INSTRUCTIONS:  Activity:  The patient has been advised to avoid heavy lifting, pushing, pulling, or driving for 48 hours.  Diet:  Low salt, low cholesterol.  Postangioplasty instructions have been given.  The patient at this stage is not keen to join phase 2 cardiac rehab, but will think about it and discuss with me or Dr. Shana Cook as outpatient.  DISCHARGE FOLLOWUP:  Follow up with me in one week and Dr. Shana Cook as scheduled.  CONDITION AT DISCHARGE:  Stable.  BRIEF ADMISSION HISTORY:  [name] is a 77 year old black male with Past Medical History significant for coronary artery  disease, status post multivessel PTCA and stenting in September 2000, chronic stable angina, hypertension, hypercholesterolemia, history of tobacco abuse, positive family history of coronary artery disease.  Complains of recurrent retrosternal chest heaviness with minimal exertion relieved with sublingual nitroglycerin and rest, lasting few minutes, associated with shortness of breath, nausea, and feeling weak and tired.  The patient underwent Persantine Cardiolite in September 2001 which showed mild ischemia in the inferobasilar aspect of the lateral wall with EF of 58%.  The patient opted for medical management, but due to recurrent chest pain requiring more sublingual nitroglycerin, the patient now consented for PCI.  PAST MEDICAL HISTORY:  As above plus history of CVA approximately four to five years ago.  PAST SURGICAL HISTORY:  He had right shoulder surgery in 1970s.  SOCIAL HISTORY:  He is married and has three children.  Retired.  Smoked a half pack per day for 15 years, quit 45 years ago.  __________ provides him transportation at the _______.  Born in [place], [place].  Most of his life lived in [place] and now lives in [place].  FAMILY HISTORY:  Father died of MI at the age of 46.  Mother died of old age. He has three brothers and two sisters.  MEDICATIONS AT HOME: 1. Aspirin. 2. Lipitor 10 mg p.o. q.d. 3. Nitro-Dur 0.4 mg per hour q.d. 4. Toprol XL 25 mg p.o. q.d. which was added recently. 5. Nitrostat sublingual p.r.n.  PHYSICAL EXAMINATION ON ADMISSION:  GENERAL APPEARANCE:  He was alert, awake, oriented x 3 in  no acute distress.  VITAL SIGNS:  Blood pressure was 140/80, pulse was 68.  HEENT:  Conjunctivae were pink.  NECK:  Supple, no JVD, no bruits.  CHEST:  Lungs were clear to auscultation without rhonchi or rales.  CARDIOVASCULAR:  S1 and S2 were normal.  There was no S3 gallop.  ABDOMEN:  Soft, bowel sounds were present,  nontender.  EXTREMITIES:  There was no clubbing, cyanosis, or edema.  HOSPITAL COURSE:  The patient was a morning admit and underwent left cardiac cath with selective left and right coronary angiography, LV-graphy, and PTCA to in-stent restenosis of ramus branch.  The patient tolerated procedure well. There were no complications as per cath interventional report.  The patient was transferred to recovery room in stable condition.  Sheaths were pulled out after the procedure once the CT was Christopher than 150.  The patient has been ambulating in hallway without any problems.  His groin is stable.  There is no evidence of hematoma or bruit.  The patient has not had any episodes of chest pain during the hospital stay.  Discussed with the patient at length regarding his cardiac rehab to which the patient at this stage is not too keen to join, but will think about it and will discuss as outpatient.  The patient does have significant lesion in distal circumflex and distal LAD near the apex supplying small area of myocardium.  At present, we will treat him medically.  If he continues to have chest pain, then he may need PCI to above vessels.  LABORATORY DATA:  Postprocedure CPK was 129.  His hemoglobin was stable at 15.6 and 46.5.  Platelet count is 169.  Potassium 3.7, BUN is 19, creatinine 1.2.  A lipid panel is still pending. DD:  06/01/00 TD:  06/01/00 Job: [id number]

## 2010-05-20 NOTE — H&P (Signed)
NAME:  REI, MEDLEN NO.:  [id number]   MEDICAL RECORD NO.:  [id number]          PATIENT TYPE:  INP   LOCATION:  4729                         FACILITY:  [facility]   PHYSICIAN:  Ricki Rodriguez, M.D.  DATE OF BIRTH:  July 03, 1921   DATE OF ADMISSION:  10/08/2005  DATE OF DISCHARGE:                                HISTORY & PHYSICAL   CHIEF COMPLAINT:  Weakness and dizziness.   HISTORY OF PRESENT ILLNESS:  This 77 year old black male with a history of  left lung mass and surgery has a 1 day history of inability to stand without  feeling dizzy.  The patient had some dizziness earlier in the week and was  seen by the primary care physician with the possibility of otitis media.   PAST MEDICAL HISTORY:  1. Positive for borderline diabetes mellitus.  2. Hypertension.  3. Prior CA of the lung, post lung cancer surgery.  The patient has      remained normotensive to hypotensive.  4. Coronary artery disease.  5. Hyperlipidemia.   PAST SURGICAL HISTORY:  Left lung surgery.   FAMILY HISTORY:  Positive for cardiovascular disease.   PERSONAL HISTORY:  The patient quit smoking in 1970.  He is married, does  not drink alcohol, and he is retired.   REVIEW OF SYSTEMS:  History of atrial fibrillation, exertional dyspnea, some  weight loss, positive polydipsia and polyuria.  No history of asthma.  No  history of nausea, vomiting, diarrhea, constipation, hepatitis.  No history  of dysuria or kidney stone.  No history of DVT.  No history of bleeding from  the bowel or black stools.  A positive history of depression, positive  history of arthritis, and positive history of dizziness and headache.   PHYSICAL EXAMINATION:  GENERAL:  The patient is a well-developed, well-  nourished black male in no acute distress.  VITAL SIGNS:  Blood pressure 140/74 lying and 88/48 standing.  Pulse 72,  respirations 17, oxygen saturation 98%.  HEENT:  The patient is normocephalic, atraumatic.  Eyes  round, pupils  reactive to light.  Extraocular movements intact.  Ear, nose, throat grossly  unremarkable.  Tongue pale pink.  NECK:  No JVD, no lymphadenopathy, no thyromegaly.  LUNGS:  Clear to auscultation with some decreased breath sounds on left  side.  HEART:  Normal S1, S2.  ABDOMEN:  Soft and nontender.  EXTREMITIES:  No edema.  CNS:  The patient is alert and oriented x2.  Her cranial nerves grossly  intact.  Moves all 4 extremities.  Negative Romberg sign.  Positive  dizziness with change in position.   LABORATORY DATA:  Sodium 137, potassium 4.9, chloride 100, glucose 111,  creatinine 1.6, albumin 3.3, WBC 3700, hemoglobin 13.7, hematocrit 41.9,  platelet count 249,000.  Urinalysis:  Many bacteria.   IMPRESSION:  1. Dizziness.  2. Positional vertigo.  3. Rule out acute gastrointestinal bleed.  4. Rule out cardiac dysrhythmia.  5. Rule out hypothyroidism and cortisol insufficiency.   PLAN:  1. Admit the patient to telemetry bed.  2. Give IV fluids.  3. Continue home  medications except aspirin and Plavix.  4. Check TSH and cortisol level.      Ricki Rodriguez, M.D.  Electronically Signed     ASK/MEDQ  D:  10/08/2005  T:  10/09/2005  Job:  [id number]

## 2010-05-20 NOTE — Op Note (Signed)
[name], Christopher               ACCOUNT NO.:  [id number]   MEDICAL RECORD NO.:  [id number]          PATIENT TYPE:  AMB   LOCATION:  SDS                          FACILITY:  [facility]   PHYSICIAN:  Ines Bloomer, M.D. DATE OF BIRTH:  [birth year]   DATE OF PROCEDURE:  11/18/2004  DATE OF DISCHARGE:  11/18/2004                                 OPERATIVE REPORT   PREOPERATIVE DIAGNOSIS:  Left upper lobe mass.   POSTOPERATIVE DIAGNOSIS:  Left upper lobe mass.   OPERATION PERFORMED:  Video bronchoscopy.   ANESTHESIA:  IV sedation, Cetacaine and Xylocaine.   SURGEON:  Ines Bloomer, M.D.   After adequate anesthesia with Cetacaine and Xylocaine, the video  bronchoscope was passed through the mouth.  The cords were in the midline.  His trachea was normal.  The carina was in the midline.  There was severe  bronchitis, however, in the tracheobronchial tree.  The right upper lobe,  right middle lobe, and right lower lobe orifices were normal.  The left main  stem orifice, there was question of some small nodules on the lateral wall  at the left main stem, biopsies of these were obtained.  The bronchoscope  was advanced under fluoroscopy where we could see the left upper lobe  infiltrate and brushings were taken from this area under fluoroscopic  guidance.  Biopsies were taken from this area under fluoroscopic guidance.  The scope was then brought back in the left main stem bronchus and several  biopsies of the left main stem bronchus were done.  The video bronchoscope  was passed back to the left upper lobe and several more biopsies of the left  upper lobe were done.  The bronchoscope was removed.  The patient was  returned to the recovery room in stable condition.           ______________________________  Ines Bloomer, M.D.     DPB/MEDQ  D:  11/18/2004  T:  11/19/2004  Job:  [id number]   cc:   Osvaldo Shipper. Spruill, M.D.  Fax: [phone]

## 2010-05-20 NOTE — Discharge Summary (Signed)
NAMELATRAVIS, GRINE NO.:  [id number]   MEDICAL RECORD NO.:  [id number]                   PATIENT TYPE:  INP   LOCATION:  3712                                 FACILITY:  [facility]   PHYSICIAN:  Osvaldo Shipper. Spruill, M.D.             DATE OF BIRTH:  [birth year]   DATE OF ADMISSION:  DATE OF DISCHARGE:  05/28/2003                                 DISCHARGE SUMMARY   DISCHARGE DIAGNOSES:  1. Transient cerebral ischemia.  2. Angina.  3. Malignant hypertension.  4. Coronary atherosclerosis.  5. 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   CONSULTANT:  Dr. Thad Ranger and Dr. Sharyn Lull.   HISTORY:  Christopher Cook is a 77 year old male who initially presented to the  [facility] Emergency Room on May 23, 2003.  He gave history that on the  morning of admission, he felt very dizzy and his right leg was numb.  He  denied any chest pain at that time, but stated he had had chest pain  earlier.  He was evaluated in the emergency department where his chemistry  panel was found to be essentially within normal limits.  His cardiac markers  were found to be negative for acute injury.  His complete blood count was  within normal limits.  His electrocardiogram was noted to have normal sinus  rhythm with first degree AV block, a new right bundle branch block and ST-T  wave changes being present.  Subsequently, the patient was admitted for  evaluation of his new onset angina accompanied by new right bundle branch  block changes, especially in light of his history of coronary artery disease  and status post multivessel PCI in the past.   HOSPITAL COURSE:  The patient was admitted to the medical service, placed on  telemetry, was seen in consultation by pharmacy for heparin protocol.  He  was placed on nitroglycerin which was titrated for pain. His blood pressure  was also difficult to control initially.  The nitroglycerin drip was used to  again regain  control of his blood pressure problem.   He next had serial cardiac enzymes which were essentially within normal  limits. There was a complaint of some right leg numbness.  The patient  underwent CT scan of the brain without contrast which revealed no acute  abnormality.  An MRI of the brain was later obtained which revealed atrophy  and chronic ischemic changes but no acute infarct.  MRA revealed distal  vertebral artery was now present and may be congenitally hypoplastic or  occluded.  Otherwise, it was read as negative.  Also, the patient underwent  carotid Doppler studies which revealed no significant plaque and no  significant intracoronary artery stenosis.   Also, he received a neurology consultation and after evaluation and review  of the labs, it was the opinion that the patient had a left brain transient  ischemic attack with question of  posterior circulation event.  Plans were  made to discontinue the heparin, place the patient on Plavix for CVA  prophylaxis.  On May 24, the patient was feeling better.   Plans were made for home health R.N., physical therapy and occupational  therapy through [facility].  On May 28, 2003, it was the opinion of  the attending physician as well as the consulting physician that the patient  had received maximum benefit from his hospitalization and could be  discharged home.   DISCHARGE MEDICATIONS:  1. Sudafed 30 mg twice daily.  2. Ativan 1 mg 3 times daily when needed.  3. Nasonex 1 spray daily.  4. Norvasc 10 mg each morning.  5. Metoprolol 25 mg daily.  6. Plavix 75 mg daily with breakfast.  7. Zocor 40 mg each evening at 6 p.m.  8. Protonix 40 mg with breakfast.  9. Altace 10 mg each morning.  10.      Norvasc 10 mg each morning.   DISPOSITION:  Patient was placed on a low salt, low fat diet. He is to be  seen in the office in 2 weeks. Also, he has scheduled an appointment with  Dr. Pearlean Brownie with neurology in 2 months. He is to  notify the physician  immediately for any changes, problems or concerns.      Christopher Cook, P.A.                       Osvaldo Shipper. Spruill, M.D.    [place]  D:  06/24/2003  T:  06/26/2003  Job:  [id number]

## 2010-05-20 NOTE — H&P (Signed)
Christopher Cook, LEVARIO NO.:  [id number]   MEDICAL RECORD NO.:  [id number]          PATIENT TYPE:  INP   LOCATION:  2899                         FACILITY:  [facility]   PHYSICIAN:  Hilda Lias, M.D.   DATE OF BIRTH:  [birth year]   DATE OF ADMISSION:  06/07/2004  DATE OF DISCHARGE:                                HISTORY & PHYSICAL   HISTORY OF PRESENT ILLNESS:  The patient is a gentleman who underwent  anterior decompression several months ago because of cervical myelopathy.  Nevertheless, he had been developing weakness on the right hand associated  with atrophy of the intrinsic muscles of the right hand.  He had a complete  neurological workup and it was found that he has bilateral ulnar neuropathy,  right worse than the left one.  Also, he has chronic severe radiculopathy.  Because of weakness, he agreed to decompressing the right ulnar nerve.   PAST MEDICAL HISTORY:  1.  Decompression at the level of C3-C4.  2.  He has a history of hypertension.  3.  He also had a inguinal hernia repair.   FAMILY HISTORY:  Unremarkable.   SOCIAL HISTORY:  Negative.   REVIEW OF SYSTEMS:  Review of systems is positive for sinus headache, high  blood pressure, double vision and anxiety.   PHYSICAL EXAMINATION:  HEENT:  Normal.  NECK:  He has a scar anteriorly on the left side.  LUNGS:  Clear.  HEART:  Heart sounds normal.  ABDOMEN:  Normal.  EXTREMITIES:  Normal pulses.  NEUROLOGIC:  He is oriented x3.  Cranial nerves normal.  In the right hand,  he has atrophy of the intrinsic muscles.  He has weakening of the right  hand.  The left side is borderline normal.   CLINICAL IMPRESSION:  Right ulnar nerve neuropathy.   RECOMMENDATIONS:  The patient is being admitted for decompression of the  right ulnar nerve.  He knows about the risks such as infection, no  improvement whatsoever and need for further surgery.      EB/MEDQ  D:  06/07/2004  T:  06/07/2004  Job:  [id number]

## 2010-05-20 NOTE — Discharge Summary (Signed)
Christopher Cook, Christopher Cook NO.:  [id number]   MEDICAL RECORD NO.:  [id number]          PATIENT TYPE:  INP   LOCATION:  4738                         FACILITY:  [facility]   PHYSICIAN:  Christopher Cook, M.D.DATE OF BIRTH:  1921-05-11   DATE OF ADMISSION:  10/08/2005  DATE OF DISCHARGE:  10/24/2005                               DISCHARGE SUMMARY   DISCHARGE DIAGNOSES:  1. Orthostatic hypotension.  2. Gout.  3. Depressive disorder.  4. History of bronchogenic malignancy.  5. Coronary artery disease.  6. Hypertension.   Christopher Cook is an 77 year old patient who presented initially to the  emergency department at the [facility] after having sustained a  fall.  His son reported four near-syncopal episodes on the morning of  admission and described a problem with dizziness upon standing.   During the course of the emergency department evaluation, the  orthostatic vital signs were noted to be abnormal.  The complete blood  count, cardiac markers were all negative.  The patient is subsequently  admitted for aggressive evaluation of his weakness and dizziness.   HOSPITAL COURSE:  The patient is admitted to the medical service, placed  on telemetry, placed on IV fluids.  His home medications were restored  except his aspirin and Plavix.  His cortisol levels were also to be  checked.   On October 9, the patient complained of some problems with headache.  The patient continued to have problems with orthostatic changes.  Patient was seen by Dr. Margaretmary Bayley.  It was his opinion that the  patient had an orthostatic hypotension with a normal intravascular  volume.  It was his opinion that the patient would need a neurology  consultation to evaluate the possibility of paraganglionic autonomic  neuropathy.   On October 12, the patient was seen by neurology.  It was there opinion  the patient's hemoglobin A1c and fasting CBGs should be checked.  The  cortisol levels were  checked by Dr. Chestine Spore and was found to be 20.6 well  within acceptable range and not indicative of adrenal insufficiency.  Patient was seen by rehabilitation.   On October 22, patient developed some dyspnea and chest pain.  EKG and  cardiac enzymes were reviewed.  RPR was obtained and found to be  nonreactive.  The cardiac enzymes were negative for acute problem.  A  transthoracic echocardiogram was also obtained for this patient.  The  overall ventricular systolic function was normal, the left ventricular  ejection fraction was estimated to be 70%, the left ventricular wall  thickness was mild to moderately increased and there were no other  significant problems or changes appreciated.  A whole-body bone scan was  obtained, there was no evidence of metastasis given the patient's  previous history of lung cancer.  Serial electrocardiograms revealed  sinus rhythm with first-degree AV block and left axis deviation, right  bundle-branch block.  CT scans of the head revealed stable chronic small-  vessel ischemic changes and atrophy, but no acute intracranial findings.   On October 23, there continued to be some orthostatic changes and some  mild dizziness, but improved from when the patient was admitted.  It was  the opinion of the attending physician as well as the consulting  physicians that the workup should be completed as an outpatient  evaluation and plans were made for this evaluation.   MEDICATIONS AT DISCHARGE:  Include:  1. Nasonex one a.m. and p.m.  2. Allopurinol 300 mg each morning.  3. Protonix 40 mg daily.  4. Plavix 75 mg daily.  5. Antivert 25 mg t.i.d.  6. Albuterol two puffs four times a day.   Patient is to be seen in the office in two weeks, sooner if any changes,  problems or concerns.      Ivery Quale, P.A.      Christopher Cook, M.D.  Electronically Signed    HB/MEDQ  D:  12/02/2005  T:  12/03/2005  Job:  [id number]

## 2010-05-20 NOTE — H&P (Signed)
Christopher Cook, Christopher Cook               ACCOUNT NO.:  [id number]   MEDICAL RECORD NO.:  [id number]          PATIENT TYPE:  INP   LOCATION:  NA                           FACILITY:  [facility]   PHYSICIAN:  Ines Bloomer, M.D. DATE OF BIRTH:  Mar 01, 1921   DATE OF ADMISSION:  12/30/2004  DATE OF DISCHARGE:                                HISTORY & PHYSICAL   CHIEF COMPLAINT:  Left lung mass.   HISTORY OF PRESENT ILLNESS:  This 77 year old patient was found to have a  left upper lobe lesion with some calcification in the mediastinal nodes.  PET scan was done that showed an SUV of 20% and this was thought to be  either cancer or some type of granulomatous disease.  Pulmonary function  tests showed an FVC of 2.12 with an FEV1 of 1.6.  He underwent a  bronchoscopy which was negative, but then had needle biopsy which revealed  adenocarcinoma.  He has had no hemoptysis, weight loss, excessive sputum.   PAST MEDICAL HISTORY:  1.  Cardiac disease which he has had several stents.  Is treated by Dr.      Donia Guiles.  2.  Hypertension.   He was seen in consultation by Dr. Shana Chute and thought that he was adequate  for resection.   MEDICATIONS:  1.  Toprol 25 mg a day.  2.  Zocor 40 mg a day.  3.  Caduet 5/10 once a day.  4.  Plavix 75 mg a day which has been stopped.  5.  Ambien 10 mg at night.  6.  Nitroglycerin p.r.n.  7.  Lorazepam 10 mg daily.  8.  Nasonex p.r.n.  9.  Protonix 40 mg daily.  10. Mevacor 20 mg a day.  11. Lozol 2.5 mg a day.   FAMILY HISTORY:  Positive for cardiovascular disease.   SOCIAL HISTORY:  Quit smoking in 1970.  He is married.  Does not drink  alcohol on a regular basis.   REVIEW OF SYSTEMS:  188 pounds.  He is 5 feet 8 inches.  CARDIAC:  In past  medical history but he does have atrial fibrillation and some exercise  dyspnea.  PULMONARY:  He has had, as mentioned, no hemoptysis, wheezing,  mild shortness of breath.  GI:  No nausea, vomiting, constipation,  diarrhea.  GU:  No dysuria or frequent urination.  VASCULAR:  No DVT or claudication.  He has had some TIAs in the past, but nothing recently.  NEUROLOGIC:  He has  some dizziness and headaches.  ORTHOPEDICS:  Bilateral major joint pain.  PSYCHIATRIC:  He has been treated for situational depression in the past.  HEENT:  There is no changes in his eyesight or hearing.  IMMUNOLOGIC:  No  problems with bleeding or anemia.   PHYSICAL EXAMINATION:  GENERAL:  He is a well-developed African-American  male in no acute distress.  VITAL SIGNS:  Blood pressure 118/70, pulse 86, respirations 18, saturations  96%.  HEENT:  Atraumatic.  Eyes:  Pupils are equal, round, and reactive to light  and accommodation.  Extraocular movements were normal.  Ears:  Tympanic  membranes are intact.  Nose:  There is no septal deviation.  Throat is  supple.  NECK:  No supraclavicular or axillary adenopathy.  Mild thyromegaly.  No  carotid bruits.  CHEST:  Clear to auscultation, percussion.  There is also a small nodule  that feels like lymph nodes in the right anterior deltopectoral groove.  HEART:  Regular sinus rhythm.  No murmurs.  ABDOMEN:  Soft.  There is no hepatosplenomegaly.  Bowel sounds are normal.  EXTREMITIES:  Pulses are 2+.  There is no clubbing or edema.  NEUROLOGIC:  He is oriented x3.  Sensory and motor intact.  Cranial nerves  are intact.  SKIN:  Without any lesions.   IMPRESSION:  1.  Left upper lobe cancer.  2.  Atherosclerotic cardiovascular disease.  3.  Hypertension.   PLAN:  Do a VATS left upper lobectomy on him.           ______________________________  Ines Bloomer, M.D.     DPB/MEDQ  D:  12/28/2004  T:  12/28/2004  Job:  [id number]

## 2010-05-20 NOTE — Consult Note (Signed)
[name], Christopher Cook                           ACCOUNT NO.:  [id number]   MEDICAL RECORD NO.:  [id number]                   PATIENT TYPE:  INP   LOCATION:  3712                                 FACILITY:  [facility]   PHYSICIAN:  Casimiro Needle L. Thad Ranger, M.D.           DATE OF BIRTH:  [birth year]   DATE OF CONSULTATION:  05/25/2003  DATE OF DISCHARGE:                                   CONSULTATION   REQUESTING PHYSICIAN:  Dr. Donia Guiles.   REASON FOR EVALUATION:  TIA.   HISTORY OF PRESENT ILLNESS:  This is the initial inpatient consultation  evaluation of this 77 year old male with a past medical history which  includes hypertension and coronary artery disease.  The patient reports that  he felt a little bit generally unwell Friday, just did not have very much  energy but nothing specific.  He felt better on Saturday and about to walk  outside to work on the yard when he noted the sudden onset of a dizzy  sensation described as lightheadedness along with blurring of his vision and  numbness involving the right arm and leg but sparing the face.  There was no  definite weakness associated with this.  He denies any associated headache,  nausea, vomiting, chest pain, palpitations or syncope.  The episode lasted 5-  10 minutes but resolved entirely.  He denies ever having anything like this  before.  He came to the emergency room for evaluation but apparently also  reported some substernal chest pain and he has been subsequently admitted  for workup for both TIA and for GI pain.  He has not had any recurrent  symptoms while in the hospital and overall reports that he is presently  feeling very well.  He has never had any transient focal neurologic deficits  other than the fact that he did at one point have an episode a few years ago  when he became a little bit confused in his bathroom and has suggested that  this might have been a mini stroke.   PAST MEDICAL HISTORY:  History of coronary  artery disease with stent  placement x8, most recently in May of 2003.  He also has a history of  hypertension and hypercholesterolemia as well as chronic sinusitis.   FAMILY HISTORY:  Father died at 30 of an MI, mother died of old age.  No  definite family history of strokes.   SOCIAL HISTORY:  He has been retired for several years.  He has a very  remote smoking history.  He is married and normally independent in his  activities of daily living.   ALLERGIES:  DENIED.   MEDICATIONS:  Prior to admission he was taking baby aspirin, Nitro-Dur,  Toprol, Lipitor and p.r.n. doses of Ambien, Clarinex, Nasonex, and a nerve  pill.  In the hospital he was also on Plavix and heparin as well as Altace  and Protonix and a  nitroglycerin drip.   PHYSICAL EXAMINATION:  VITAL SIGNS:  Temperature 97.7, blood pressure  154/82, pulse 54, respirations 24, O2 sat 95% on room air.  GENERAL EXAMINATION:  This is a healthy appearing man seated and in no  evident distress.  HEAD:  Cranium was normocephalic and atraumatic.  Oropharynx was benign.  NECK:  Is supple without carotid or supraclavicular bruits.  HEART:  Regular rate and rhythm without murmur.  NEUROLOGIC EXAM:  Mental status:  He is awake, alert and completely  oriented.  Recent and remote memory are intact.  Attention __________  concentration, fund of knowledge are appropriate.  Speech is fluent and not  dysarthric.  Mood is euthymic and affect appropriate.  Cranial nerves:  Funduscopic exam shows some mild chronic hypertensive changes, pupils are  small but reactive.  Extraocular movements are full without nystagmus.  Visual fields are full to confrontation.  Hearing is intact and symmetric to  finger rub.  Facial sensation is intact to pinprick.  Face, tongue and  palate move normally and symmetrically.  Shoulder shrug strength is normal.  Motor testing:  Normal bulk and tone.  Normal strength in all tested  extremity muscles.  Sensation is  intact to light touch and pinprick in all  extremities.  Coordination:  Rapid movements are performed well.  Finger-to-  nose and heel-to-shin are performed adequately.  Gait:  He arises from the  bed easily and his stance is normal.  He is able to heel, toe and tandem  walk without difficulty.  Reflexes are 2+ and symmetric, toes are downgoing.   LABORATORY REVIEW:  CBC from this a.m.:  Unremarkable.  Liver functions May 23, 2003:  Unremarkable.  Hemoglobin A1c performed this morning at the upper  limit of normal at 6.1.  Lipid profile done nonfasting May 24, 2003, total  cholesterol 144, HDL of 46, LDL 86, homocysteine level done yesterday  nonfasting normal.  Carotid Doppler report earlier today reportedly  unremarkable.  Two-D echocardiogram performed today is pending.  MRI of the  brain with MRA of the intracranial circulation is personally reviewed.  There is no acute infarct present.  There are minor changes of chronic small  vessel disease and a couple of small lacunar infarcts in the head of the  caudate on the left but no old cortical infarction.  There is mild atrophy  but no ventriculomegaly.  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   IMPRESSION:  Subcortical left brain transient ischemic attack.  Possible  posterior circulation event given the blurring of vision. His risk factors  include hypertension, controlled hyperlipidemia and history of coronary  artery disease.   RECOMMENDATIONS:  1. There is no neurologic indication for heparin and this may be     discontinued if okayed from a cardiac standpoint.  Would use Plavix for     stroke prophylaxis given the comorbidity of coronary artery disease.     Given that there is only a single vertebral artery, would suggest MRA of     the extracranial circulation (neck) to exclude a proximal right vertebral    lesion which may be amenable to  angioplasty.  2. Also, followup on the echo results.  3. He needs ongoing risk factor control, especially control of the     hypertension.   Stroke service will follow.  Thank you for the consultation.  Michael L. Thad Ranger, M.D.    MLR/MEDQ  D:  05/25/2003  T:  05/26/2003  Job:  [id number]

## 2010-05-20 NOTE — Op Note (Signed)
NAMEPLEASANT, BENSINGER NO.:  [id number]   MEDICAL RECORD NO.:  [id number]          PATIENT TYPE:  INP   LOCATION:  2310                         FACILITY:  [facility]   PHYSICIAN:  Ines Bloomer, M.D. DATE OF BIRTH:  Dec 19, 1921   DATE OF PROCEDURE:  12/30/2004  DATE OF DISCHARGE:                                 OPERATIVE REPORT   PREOPERATIVE DIAGNOSIS:  Adenocarcinoma right upper lobe.   POSTOPERATIVE DIAGNOSIS:  Adenocarcinoma right upper lobe.   OPERATION PERFORMED:  Right video assisted thoracoscopic surgery with right  thoracotomy, right upper lobectomy with node dissection.   SURGEON:  Ines Bloomer, M.D.   FIRST ASSISTANT:  Rowe Clack, P.A.-C.   ANESTHESIA:  General.   After the percutaneous insertion of all monitoring lines, the patient  underwent general anesthesia and was turned to the right lateral thoracotomy  position.  A dual lumen tube was inserted and the patient was prepped and  draped in the usual sterile fashion.  Two trocar sites were made, one in the  eighth intercostal space at the mid axillary line and at the seventh  intercostal space at the anterior axillary line.  Two trocars were inserted.  We could not get the lung to collapse and it was realized that the tube was  down the right main stem bronchus.  We tried to reduce the tidal volume, but  because of this, we had to do a full thoracotomy.  So, an anterolateral  thoracotomy was made over the fifth intercostal space, the latissimus was  divided as well as the serratus.  The fifth rib was resected subperiosteally  resecting about 2/3 of the fifth rib to get an adequate opening to attempt  to do the operation.  The patient had very emphysematous lungs.  It was very  difficult to expose the vessels.  Attention was turned to the hilum,  dissecting out the superior pulmonary vein.  The branches of the superior  pulmonary vein were doubly ligated with 2-0 silk and divided and then  the  superior pulmonary vein branches were then divided with an auto-suture 30  white reticular.  Then, we started dissecting out the pulmonary artery, the  apical posterior branch was dissected out and divided with the auto-suture  30 white Roticulator.  However, in dissecting out the anterior branch, this  started bleeding and we had to clamp it with a Satinsky clamp and then  oversew it with 4-0 Prolene with pledgets.  After this had been done, we  ended up having to transfuse the patient.  Attention was turned to the  fissure and the inferior portion of the fissure was divided with an EZ-45  stapler.  Then, the inferior branches were dissected out, doubly ligated,  and divided with 2-0 silk, clipped, and divided.  Several 11L nodes were  dissected free.  We dissected up the fissure more and found another anterior  branch which was doubly ligated with 0 silk, clipped, and divided.  There  was a third anterior branch, again in manipulation of the lungs, started  bleeding from its base.  Pressure was held on it.  It was clipped.  We  ligated proximally and distally with 2-0 silks and divided.  Then, we had to  use horizontal mattress 4-0 Prolene and 3-0 Prolene pledgets to suture the  base, again, with some more bleeding which was controlled with digital  pressure and then sutured with horizontal mattress pledgets.  After this had  been done, one more stitch was placed with 4-0 Prolene, a horizontal  mattress suture.  No more bleeding was seen.  The bronchus was then  dissected free, dissecting out some 10L nodes and stapling the bronchus with  the TL30 stapler and dividing it distally.  The left upper lobe was removed.  The bronchial margins were negative and was adenocarcinoma.  FloSeal was  applied and CoSeal applied to the staple line.  Two chest tubes were brought  in through the trocar sites and tied in place with  0 silk.  The chest was closed with six pericostals, #1 Vicryl in the  muscle  layer, both running and interrupted, 2-0 Vicryl in the subcutaneous tissue,  and Ethicon skin clips.  The patient was returned to the recovery room in  serious condition.           ______________________________  Ines Bloomer, M.D.     DPB/MEDQ  D:  12/30/2004  T:  12/30/2004  Job:  [id number]   cc:   Osvaldo Shipper. Spruill, M.D.  Fax: [phone]

## 2010-05-20 NOTE — Op Note (Signed)
[name], [name] NO.:  [id number]   MEDICAL RECORD NO.:  [id number]          PATIENT TYPE:  INP   LOCATION:  2899                         FACILITY:  [facility]   PHYSICIAN:  Hilda Lias, M.D.   DATE OF BIRTH:  1921-09-30   DATE OF PROCEDURE:  12/15/2003  DATE OF DISCHARGE:                                 OPERATIVE REPORT   PREOPERATIVE DIAGNOSES:  C3-C4 spondylosis with stenosis, with C4  radiculopathy.   POSTOPERATIVE DIAGNOSES:  C3-C4 spondylosis with stenosis, with C4  radiculopathy.   PROCEDURE:  C3-C4 diskectomy, decompression of spinal cord, bilateral  foraminotomy, interbody fusion with allograft plate, microscope.   SURGEON:  Hilda Lias, M.D.   ASSISTANT:  Hewitt Shorts, M.D.   INDICATIONS FOR PROCEDURE:  The patient is admitted because of neck pain  with radiation to both upper extremities, right worse than the left one.  X-  rays showed a step-off between C3 and C4 with stenosis and herniated disk  and spondylosis, associated with a foraminal compromise.  Surgery was  advised, and the risks were explained in the history and physical.   DESCRIPTION OF PROCEDURE:  The patient was taken to the operating room, and  after intubation the left side of the neck was prepped with Betadine.  A  transverse incision was made through the skin and platysma down to the  cervical spine.  X-rays showed that we were at the level of C3-C4.  From  then on we opened the anterior ligament.  We brought the microscope into the  area.  A diskectomy with removal of degenerative disk was achieved.  Then we  opened the posterior ligament.  The patient has central and lateral  stenosis.  Using the 1 mm and the 2 mm Kerrison punch, as well as the drill,  we did a decompression of the spinal cord as well as the bilateral  foraminotomy.  At the end, we had good room for the spinal cord and for the  C4 nerve root.  Then the end plate was drilled, and the allograft  which was  6 mm was inserted.  This was followed by a plate using four screws.  The  lateral C-spine showed good position of the allograft and of the plate.  The  area was irrigated.  Hemostasis was done with bipolar.  After five minutes,  when we found that there was no evidence of any bleeding, the wound was  closed with Vicryl and Steri-Strips.       EB/MEDQ  D:  12/15/2003  T:  12/15/2003  Job:  [id number]

## 2010-06-13 ENCOUNTER — Other Ambulatory Visit (HOSPITAL_COMMUNITY): Payer: Medicare Other

## 2010-06-29 ENCOUNTER — Other Ambulatory Visit: Payer: Self-pay | Admitting: Internal Medicine

## 2010-06-29 ENCOUNTER — Encounter (HOSPITAL_BASED_OUTPATIENT_CLINIC_OR_DEPARTMENT_OTHER): Payer: Medicare Other | Admitting: Internal Medicine

## 2010-06-29 DIAGNOSIS — C3490 Malignant neoplasm of unspecified part of unspecified bronchus or lung: Secondary | ICD-10-CM

## 2010-06-29 DIAGNOSIS — C3410 Malignant neoplasm of upper lobe, unspecified bronchus or lung: Secondary | ICD-10-CM

## 2010-06-29 LAB — CBC WITH DIFFERENTIAL/PLATELET
BASO%: 0.4 % (ref 0.0–2.0)
HCT: 37.4 % — ABNORMAL LOW (ref 38.4–49.9)
MCHC: 32.4 g/dL (ref 32.0–36.0)
MONO#: 0.3 10*3/uL (ref 0.1–0.9)
NEUT%: 65.8 % (ref 39.0–75.0)
RBC: 4.22 10*6/uL (ref 4.20–5.82)
RDW: 17.3 % — ABNORMAL HIGH (ref 11.0–14.6)
WBC: 2.6 10*3/uL — ABNORMAL LOW (ref 4.0–10.3)
lymph#: 0.3 10*3/uL — ABNORMAL LOW (ref 0.9–3.3)

## 2010-06-29 LAB — COMPREHENSIVE METABOLIC PANEL
ALT: 14 U/L (ref 0–53)
AST: 14 U/L (ref 0–37)
CO2: 28 mEq/L (ref 19–32)
Calcium: 9.2 mg/dL (ref 8.4–10.5)
Chloride: 105 mEq/L (ref 96–112)
Potassium: 4 mEq/L (ref 3.5–5.3)
Sodium: 141 mEq/L (ref 135–145)
Total Protein: 6.6 g/dL (ref 6.0–8.3)

## 2010-07-07 ENCOUNTER — Other Ambulatory Visit: Payer: Self-pay | Admitting: Radiation Oncology

## 2010-07-07 ENCOUNTER — Ambulatory Visit
Admission: RE | Admit: 2010-07-07 | Discharge: 2010-07-07 | Disposition: A | Discharge status: Home / Self Care | Payer: Medicare Other | Source: Ambulatory Visit | Attending: Radiation Oncology | Admitting: Radiation Oncology

## 2010-07-07 ENCOUNTER — Other Ambulatory Visit: Payer: Self-pay | Admitting: Internal Medicine

## 2010-07-07 DIAGNOSIS — C3490 Malignant neoplasm of unspecified part of unspecified bronchus or lung: Secondary | ICD-10-CM

## 2010-07-07 DIAGNOSIS — C3410 Malignant neoplasm of upper lobe, unspecified bronchus or lung: Secondary | ICD-10-CM | POA: Insufficient documentation

## 2010-07-07 LAB — BUN: BUN: 20 mg/dL (ref 6–23)

## 2010-07-07 LAB — CREATININE, SERUM: Creatinine, Ser: 1.44 mg/dL — ABNORMAL HIGH (ref 0.50–1.35)

## 2010-07-13 ENCOUNTER — Encounter (HOSPITAL_COMMUNITY): Payer: Self-pay

## 2010-07-13 ENCOUNTER — Ambulatory Visit (HOSPITAL_COMMUNITY)
Admission: RE | Admit: 2010-07-13 | Discharge: 2010-07-13 | Disposition: A | Discharge status: Home / Self Care | Payer: Medicare Other | Source: Ambulatory Visit | Attending: Radiation Oncology | Admitting: Radiation Oncology

## 2010-07-13 DIAGNOSIS — J984 Other disorders of lung: Secondary | ICD-10-CM | POA: Insufficient documentation

## 2010-07-13 DIAGNOSIS — R0602 Shortness of breath: Secondary | ICD-10-CM | POA: Insufficient documentation

## 2010-07-13 DIAGNOSIS — E041 Nontoxic single thyroid nodule: Secondary | ICD-10-CM | POA: Insufficient documentation

## 2010-07-13 DIAGNOSIS — Z902 Acquired absence of lung [part of]: Secondary | ICD-10-CM | POA: Insufficient documentation

## 2010-07-13 DIAGNOSIS — R634 Abnormal weight loss: Secondary | ICD-10-CM | POA: Insufficient documentation

## 2010-07-13 DIAGNOSIS — C3410 Malignant neoplasm of upper lobe, unspecified bronchus or lung: Secondary | ICD-10-CM | POA: Insufficient documentation

## 2010-07-13 DIAGNOSIS — C3490 Malignant neoplasm of unspecified part of unspecified bronchus or lung: Secondary | ICD-10-CM

## 2010-07-13 DIAGNOSIS — I2510 Atherosclerotic heart disease of native coronary artery without angina pectoris: Secondary | ICD-10-CM | POA: Insufficient documentation

## 2010-07-13 HISTORY — DX: Essential (primary) hypertension: I10

## 2010-07-13 MED ORDER — IOHEXOL 300 MG/ML  SOLN
60.0000 mL | Freq: Once | INTRAMUSCULAR | Status: AC | PRN
  Administered 2010-07-13: 60 mL via INTRAVENOUS

## 2010-10-07 LAB — COMPREHENSIVE METABOLIC PANEL
AST: 34 U/L (ref 0–37)
Albumin: 3.3 g/dL — ABNORMAL LOW (ref 3.5–5.2)
Alkaline Phosphatase: 62 U/L (ref 39–117)
Chloride: 108 mEq/L (ref 96–112)
GFR calc Af Amer: >60 mL/min (ref >60)
Potassium: 4.6 mEq/L (ref 3.5–5.1)
Total Bilirubin: 0.8 mg/dL (ref 0.3–1.2)

## 2010-10-07 LAB — DIFFERENTIAL
Basophils Absolute: 0 10*3/uL (ref 0.0–0.1)
Basophils Relative: 0 % (ref 0–1)
Eosinophils Relative: 7 % — ABNORMAL HIGH (ref 0–5)
Monocytes Absolute: 0.4 10*3/uL (ref 0.1–1.0)

## 2010-10-07 LAB — CBC
HCT: 38.2 % — ABNORMAL LOW (ref 39.0–52.0)
Platelets: 220 10*3/uL (ref 150–400)
WBC: 3.2 10*3/uL — ABNORMAL LOW (ref 4.0–10.5)

## 2010-12-22 ENCOUNTER — Ambulatory Visit (HOSPITAL_COMMUNITY)
Admission: RE | Admit: 2010-12-22 | Discharge: 2010-12-22 | Disposition: A | Discharge status: Home / Self Care | Payer: Medicare Other | Source: Ambulatory Visit | Attending: Internal Medicine | Admitting: Internal Medicine

## 2010-12-22 ENCOUNTER — Other Ambulatory Visit: Payer: Medicare Other | Admitting: Lab

## 2010-12-22 DIAGNOSIS — I700 Atherosclerosis of aorta: Secondary | ICD-10-CM | POA: Insufficient documentation

## 2010-12-22 DIAGNOSIS — R634 Abnormal weight loss: Secondary | ICD-10-CM | POA: Insufficient documentation

## 2010-12-22 DIAGNOSIS — C3410 Malignant neoplasm of upper lobe, unspecified bronchus or lung: Secondary | ICD-10-CM

## 2010-12-22 DIAGNOSIS — C3490 Malignant neoplasm of unspecified part of unspecified bronchus or lung: Secondary | ICD-10-CM

## 2010-12-22 DIAGNOSIS — I2510 Atherosclerotic heart disease of native coronary artery without angina pectoris: Secondary | ICD-10-CM | POA: Insufficient documentation

## 2010-12-22 DIAGNOSIS — E049 Nontoxic goiter, unspecified: Secondary | ICD-10-CM | POA: Insufficient documentation

## 2010-12-22 DIAGNOSIS — R0602 Shortness of breath: Secondary | ICD-10-CM | POA: Insufficient documentation

## 2010-12-22 LAB — CBC WITH DIFFERENTIAL/PLATELET
Basophils Absolute: 0 10*3/uL (ref 0.0–0.1)
EOS%: 13.8 % — ABNORMAL HIGH (ref 0.0–7.0)
MCH: 27.8 pg (ref 27.2–33.4)
MCV: 87.3 fL (ref 79.3–98.0)
MONO%: 17.1 % — ABNORMAL HIGH (ref 0.0–14.0)
RBC: 4.5 10*6/uL (ref 4.20–5.82)
RDW: 15.7 % — ABNORMAL HIGH (ref 11.0–14.6)

## 2010-12-22 LAB — COMPREHENSIVE METABOLIC PANEL
Alkaline Phosphatase: 51 U/L (ref 39–117)
Creatinine, Ser: 1.41 mg/dL — ABNORMAL HIGH (ref 0.50–1.35)
Glucose, Bld: 105 mg/dL — ABNORMAL HIGH (ref 70–99)
Sodium: 141 mEq/L (ref 135–145)
Total Bilirubin: 0.4 mg/dL (ref 0.3–1.2)
Total Protein: 6.7 g/dL (ref 6.0–8.3)

## 2010-12-29 ENCOUNTER — Ambulatory Visit (HOSPITAL_BASED_OUTPATIENT_CLINIC_OR_DEPARTMENT_OTHER): Payer: Medicare Other | Admitting: Internal Medicine

## 2010-12-29 VITALS — BP 140/68 | HR 78 | Temp 97.2°F | Wt 164.4 lb

## 2010-12-29 DIAGNOSIS — C3490 Malignant neoplasm of unspecified part of unspecified bronchus or lung: Secondary | ICD-10-CM

## 2010-12-29 NOTE — Progress Notes (Signed)
[facility] OFFICE PROGRESS NOTE   DIAGNOSIS: Stage 1 B non-small cell lung cancer, adenocarcinoma, diagnosed in December 2006.   PRIOR THERAPY:  1. Status post left lower lobectomy under the care of Dr. Edwyna Shell on December 30, 2004. 2. Status post radiotherapy to the right upper lobe nodule with tomotherapy, received a total dose of 5400 cGy in 3 fractions under the care of Dr. Michell Heinrich, completed 05/31/2010.  CURRENT THERAPY: Observation.  INTERVAL HISTORY: Christopher Cook 77 y.o. male returns to the clinic today for followup visit accompanied his granddaughter. The patient is feeling fine today and he denied having any complaints. He has no chest pain or shortness of breath, no cough or hemoptysis. No significant weight loss or night sweats. He has repeat CT scan of the chest performed recently and he is here today for evaluation and discussion of his scan results.  MEDICAL HISTORY: Past Medical History  Diagnosis Date  . Lung cancer dx'd 2006    surg only  . Hypertension     ALLERGIES:   has no known allergies.  MEDICATIONS:  Current Outpatient Prescriptions  Medication Sig Dispense Refill  . allopurinol (ZYLOPRIM) 300 MG tablet Take 300 mg by mouth daily.        Marland Kitchen aspirin 81 MG tablet Take 81 mg by mouth daily.        . carbidopa-levodopa (SINEMET) 25-100 MG per tablet Take 1 tablet by mouth 2 (two) times daily before lunch and supper.        . clopidogrel (PLAVIX) 75 MG tablet Take 75 mg by mouth daily.        Marland Kitchen escitalopram (LEXAPRO) 20 MG tablet Take 10 mg by mouth daily.        . meclizine (ANTIVERT) 25 MG tablet Take 25 mg by mouth 3 (three) times daily as needed.        . Multiple Vitamin (MULTIVITAMIN) tablet Take 1 tablet by mouth daily.        . simvastatin (ZOCOR) 80 MG tablet Take 80 mg by mouth at bedtime.          REVIEW OF SYSTEMS:  A comprehensive review of systems was negative.   PHYSICAL EXAMINATION: General appearance: alert, cooperative and  no distress Head: Normocephalic, without obvious abnormality, atraumatic Neck: no adenopathy Lymph nodes: Cervical, supraclavicular, and axillary nodes normal. Resp: clear to auscultation bilaterally Cardio: regular rate and rhythm, S1, S2 normal, no murmur, click, rub or gallop GI: soft, non-tender; bowel sounds normal; no masses,  no organomegaly Extremities: extremities normal, atraumatic, no cyanosis or edema Neurologic: Alert and oriented X 3, normal strength and tone. Normal symmetric reflexes. Normal coordination and gait  ECOG PERFORMANCE STATUS: 1 - Symptomatic but completely ambulatory  Blood pressure 140/68, pulse 78, temperature 97.2 F (36.2 C), temperature source Oral, weight 164 lb 6.4 oz (74.571 kg).  LABORATORY DATA: Lab Results  Component Value Date   WBC 2.4* 12/22/2010   HGB 12.5* 12/22/2010   HCT 39.3 12/22/2010   MCV 87.3 12/22/2010   PLT 156 12/22/2010      Chemistry      Component Value Date/Time   NA 141 12/22/2010 0919   NA 135 03/14/2010 1022   K 4.0 12/22/2010 0919   K 4.3 03/14/2010 1022   CL 105 12/22/2010 0919   CL 98 03/14/2010 1022   CO2 29 12/22/2010 0919   CO2 29 03/14/2010 1022   BUN 22 12/22/2010 0919   BUN 16 03/14/2010 1022   CREATININE  1.41* 12/22/2010 0919   CREATININE 1.7* 03/14/2010 1022      Component Value Date/Time   CALCIUM 9.1 12/22/2010 0919   CALCIUM 9.5 03/14/2010 1022   ALKPHOS 51 12/22/2010 0919   ALKPHOS 46 03/14/2010 1022   AST 14 12/22/2010 0919   AST 23 03/14/2010 1022   ALT <8 12/22/2010 0919   BILITOT 0.4 12/22/2010 0919   BILITOT 0.70 03/14/2010 1022       RADIOGRAPHIC STUDIES: Ct Chest Wo Contrast  12/22/2010  *RADIOLOGY REPORT*  Clinical Data: Lung cancer.  Partial left lung resection only. Weight loss. Shortness of breath.  CT CHEST WITHOUT CONTRAST  Technique:  Multidetector CT imaging of the chest was performed following the standard protocol without IV contrast.  Comparison: 07/13/2010.  Findings:  Post-therapy type changes left lung appears similar to the prior exam.  Marked interval change in appearance right lung since prior examination.  Diffuse parenchymal changes now surround the region of the previously noted right upper lobe nodule.  This may reflect post-therapy type changes if the patient has had interval radiation therapy at this level.  If the patient did not have interval radiation therapy than spread of tumor is not excluded.  Minimal hazy parenchymal changes right upper lobe (series 5 image 23) and right base curvilinear opacity (series 5 image 46) may represent areas of scarring.  Attention to these on follow-up.  Calcified some carinal lymph nodes unchanged.  Marked coronary artery calcifications.  Atherosclerotic type changes of the thoracic aorta with prominent plaque ascending thoracic aorta with ectatic ascending thoracic aorta measuring up to 4.2 cm without significant change.  Enlarged heterogeneous thyroid gland with substernal extension similar to the prior exam.  This can be followed with thyroid ultrasound.  Unenhanced imaging of the visualized upper abdominal structures unremarkable. No bony destructive lesion.  IMPRESSION: Change in appearance of region of previously noted right upper lobe nodule suggestive of post-therapy type changes.  Clinical correlation recommended to confirm interval radiation therapy.  The present exam represents the patient's first post-therapy baseline exam to which follow-up can be compared.  Original Report Authenticated By: Fuller Canada, M.D.    ASSESSMENT: This is a very pleasant 77 years old [place] American male with recurrent non small cell lung cancer most recently treated with stereotactic radiotherapy to the right upper lobe nodule. The patient has no evidence for disease progression on his recent scan. I discussed the scan results with the patient and his granddaughter.   PLAN: I recommended for him continuous observation for now. I  would see him back for followup visit in 6 months with repeat CT scan of the chest.  All questions were answered. The patient knows to call the clinic with any problems, questions or concerns. We can certainly see the patient much sooner if necessary.

## 2011-01-30 ENCOUNTER — Emergency Department (HOSPITAL_COMMUNITY): Payer: Medicare Other

## 2011-01-30 ENCOUNTER — Inpatient Hospital Stay (HOSPITAL_COMMUNITY): Payer: Medicare Other

## 2011-01-30 ENCOUNTER — Encounter (HOSPITAL_COMMUNITY): Payer: Self-pay | Admitting: *Deleted

## 2011-01-30 ENCOUNTER — Inpatient Hospital Stay (HOSPITAL_COMMUNITY)
Admission: EM | Admit: 2011-01-30 | Discharge: 2011-02-03 | DRG: 069 | Disposition: A | Discharge status: Skilled Nursing Facility | Payer: Medicare Other | Attending: Cardiovascular Disease | Admitting: Cardiovascular Disease

## 2011-01-30 DIAGNOSIS — F0390 Unspecified dementia without behavioral disturbance: Secondary | ICD-10-CM | POA: Diagnosis present

## 2011-01-30 DIAGNOSIS — Z85118 Personal history of other malignant neoplasm of bronchus and lung: Secondary | ICD-10-CM

## 2011-01-30 DIAGNOSIS — I639 Cerebral infarction, unspecified: Secondary | ICD-10-CM

## 2011-01-30 DIAGNOSIS — I2510 Atherosclerotic heart disease of native coronary artery without angina pectoris: Secondary | ICD-10-CM | POA: Diagnosis present

## 2011-01-30 DIAGNOSIS — Z7982 Long term (current) use of aspirin: Secondary | ICD-10-CM

## 2011-01-30 DIAGNOSIS — M109 Gout, unspecified: Secondary | ICD-10-CM | POA: Diagnosis present

## 2011-01-30 DIAGNOSIS — C3490 Malignant neoplasm of unspecified part of unspecified bronchus or lung: Secondary | ICD-10-CM

## 2011-01-30 DIAGNOSIS — G459 Transient cerebral ischemic attack, unspecified: Principal | ICD-10-CM | POA: Diagnosis present

## 2011-01-30 DIAGNOSIS — G20A1 Parkinson's disease without dyskinesia, without mention of fluctuations: Secondary | ICD-10-CM | POA: Diagnosis present

## 2011-01-30 DIAGNOSIS — Z9861 Coronary angioplasty status: Secondary | ICD-10-CM

## 2011-01-30 DIAGNOSIS — I10 Essential (primary) hypertension: Secondary | ICD-10-CM | POA: Diagnosis present

## 2011-01-30 DIAGNOSIS — N289 Disorder of kidney and ureter, unspecified: Secondary | ICD-10-CM | POA: Diagnosis present

## 2011-01-30 DIAGNOSIS — Z79899 Other long term (current) drug therapy: Secondary | ICD-10-CM

## 2011-01-30 DIAGNOSIS — Z7901 Long term (current) use of anticoagulants: Secondary | ICD-10-CM

## 2011-01-30 DIAGNOSIS — G20 Parkinson's disease: Secondary | ICD-10-CM | POA: Diagnosis present

## 2011-01-30 DIAGNOSIS — Z8673 Personal history of transient ischemic attack (TIA), and cerebral infarction without residual deficits: Secondary | ICD-10-CM

## 2011-01-30 DIAGNOSIS — R569 Unspecified convulsions: Secondary | ICD-10-CM | POA: Diagnosis present

## 2011-01-30 HISTORY — DX: Parkinson's disease without dyskinesia, without mention of fluctuations: G20.A1

## 2011-01-30 HISTORY — DX: Atherosclerotic heart disease of native coronary artery without angina pectoris: I25.10

## 2011-01-30 HISTORY — DX: Parkinson's disease: G20

## 2011-01-30 HISTORY — DX: Unspecified dementia, unspecified severity, without behavioral disturbance, psychotic disturbance, mood disturbance, and anxiety: F03.90

## 2011-01-30 HISTORY — DX: Shortness of breath: R06.02

## 2011-01-30 HISTORY — DX: Cerebral infarction, unspecified: I63.9

## 2011-01-30 HISTORY — DX: Angina pectoris, unspecified: I20.9

## 2011-01-30 LAB — POCT I-STAT, CHEM 8
Chloride: 105 mEq/L (ref 96–112)
Glucose, Bld: 164 mg/dL — ABNORMAL HIGH (ref 70–99)
HCT: 45 % (ref 39.0–52.0)
Potassium: 3.9 mEq/L (ref 3.5–5.1)
Sodium: 141 mEq/L (ref 135–145)

## 2011-01-30 LAB — COMPREHENSIVE METABOLIC PANEL
ALT: 8 U/L (ref 0–53)
AST: 17 U/L (ref 0–37)
CO2: 28 mEq/L (ref 19–32)
Calcium: 9.7 mg/dL (ref 8.4–10.5)
Chloride: 101 mEq/L (ref 96–112)
GFR calc non Af Amer: 41 mL/min — ABNORMAL LOW (ref >90)
Sodium: 139 mEq/L (ref 135–145)
Total Bilirubin: 0.5 mg/dL (ref 0.3–1.2)

## 2011-01-30 LAB — APTT: aPTT: 26 seconds (ref 24–37)

## 2011-01-30 LAB — CBC
Hemoglobin: 13.9 g/dL (ref 13.0–17.0)
MCH: 27.8 pg (ref 26.0–34.0)
MCV: 88.2 fL (ref 78.0–100.0)
Platelets: 163 10*3/uL (ref 150–400)
RBC: 5 MIL/uL (ref 4.22–5.81)
RDW: 15.2 % (ref 11.5–15.5)
WBC: 3.5 10*3/uL — ABNORMAL LOW (ref 4.0–10.5)

## 2011-01-30 LAB — CK TOTAL AND CKMB (NOT AT ARMC): Relative Index: INVALID (ref 0.0–2.5)

## 2011-01-30 LAB — DIFFERENTIAL
Basophils Absolute: 0 10*3/uL (ref 0.0–0.1)
Lymphocytes Relative: 24 % (ref 12–46)
Neutro Abs: 1.8 10*3/uL (ref 1.7–7.7)

## 2011-01-30 LAB — PROTIME-INR: Prothrombin Time: 13.7 seconds (ref 11.6–15.2)

## 2011-01-30 LAB — CREATININE, SERUM: Creatinine, Ser: 1.17 mg/dL (ref 0.50–1.35)

## 2011-01-30 MED ORDER — ENOXAPARIN SODIUM 30 MG/0.3ML ~~LOC~~ SOLN
30.0000 mg | SUBCUTANEOUS | Status: DC
  Administered 2011-01-30: 30 mg via SUBCUTANEOUS
  Filled 2011-01-30 (×2): qty 0.3

## 2011-01-30 MED ORDER — LABETALOL HCL 5 MG/ML IV SOLN
5.0000 mg | Freq: Once | INTRAVENOUS | Status: DC
  Filled 2011-01-30: qty 4

## 2011-01-30 MED ORDER — CLOPIDOGREL BISULFATE 75 MG PO TABS
75.0000 mg | ORAL_TABLET | Freq: Every evening | ORAL | Status: DC
  Administered 2011-01-30 – 2011-02-02 (×4): 75 mg via ORAL
  Filled 2011-01-30 (×5): qty 1

## 2011-01-30 MED ORDER — MECLIZINE HCL 25 MG PO TABS
25.0000 mg | ORAL_TABLET | Freq: Two times a day (BID) | ORAL | Status: DC
  Administered 2011-01-30 – 2011-02-03 (×8): 25 mg via ORAL
  Filled 2011-01-30 (×9): qty 1

## 2011-01-30 MED ORDER — AMLODIPINE BESYLATE 5 MG PO TABS
5.0000 mg | ORAL_TABLET | Freq: Every day | ORAL | Status: DC
  Administered 2011-01-30 – 2011-02-03 (×5): 5 mg via ORAL
  Filled 2011-01-30 (×5): qty 1

## 2011-01-30 MED ORDER — ALLOPURINOL 300 MG PO TABS
300.0000 mg | ORAL_TABLET | Freq: Every evening | ORAL | Status: DC
  Administered 2011-01-30 – 2011-02-02 (×4): 300 mg via ORAL
  Filled 2011-01-30 (×5): qty 1

## 2011-01-30 MED ORDER — ESCITALOPRAM OXALATE 10 MG PO TABS
10.0000 mg | ORAL_TABLET | Freq: Every day | ORAL | Status: DC
  Administered 2011-01-30 – 2011-02-03 (×5): 10 mg via ORAL
  Filled 2011-01-30 (×5): qty 1

## 2011-01-30 MED ORDER — ASPIRIN 325 MG PO TABS
325.0000 mg | ORAL_TABLET | Freq: Every day | ORAL | Status: DC
  Administered 2011-01-30 – 2011-01-31 (×2): 325 mg via ORAL
  Filled 2011-01-30 (×2): qty 1

## 2011-01-30 MED ORDER — SODIUM CHLORIDE 0.9 % IV SOLN
Freq: Once | INTRAVENOUS | Status: AC
  Administered 2011-01-30: 10:00:00 via INTRAVENOUS

## 2011-01-30 MED ORDER — CARBIDOPA-LEVODOPA 25-100 MG PO TABS
1.0000 | ORAL_TABLET | Freq: Three times a day (TID) | ORAL | Status: DC
  Administered 2011-01-30 – 2011-02-03 (×12): 1 via ORAL
  Filled 2011-01-30 (×14): qty 1

## 2011-01-30 MED ORDER — SENNOSIDES-DOCUSATE SODIUM 8.6-50 MG PO TABS: 1.0000 | ORAL_TABLET | Freq: Every evening | ORAL | Status: DC | PRN

## 2011-01-30 MED ORDER — ASPIRIN 300 MG RE SUPP
300.0000 mg | Freq: Every day | RECTAL | Status: DC
  Filled 2011-01-30 (×2): qty 1

## 2011-01-30 MED ORDER — ROSUVASTATIN CALCIUM 20 MG PO TABS
20.0000 mg | ORAL_TABLET | Freq: Every day | ORAL | Status: DC
  Administered 2011-01-30 – 2011-02-02 (×4): 20 mg via ORAL
  Filled 2011-01-30 (×5): qty 1

## 2011-01-30 NOTE — H&P (Signed)
Christopher Cook is an 78 y.o. male.   Chief Complaint: Starring, speech problem and right sided weakness.  HPI: 78 years old black male with right sided weakness and starring spell at home. He has improved speech since arrival to ED. He has neurology evaluation by Dr. Thana Farr. He and family member refused TPA.   Past Medical History  Diagnosis Date  . Lung cancer dx'd 2006    surg only  . Hypertension   . Stroke   . Parkinson disease   . Coronary artery disease   . Gout   . Dementia   . Angina   . Shortness of breath       Past Surgical History  Procedure Date  . Lasik   . Coronary angioplasty with stent placement      .  Bilateral Cataract surgery.  No family history non-contributory.  Social History:  does not have a smoking history on file. He does not have any smokeless tobacco history on file. He reports that he does not drink alcohol or use illicit drugs.  Allergies: No Known Allergies  Medications Prior to Admission  Medication Dose Route Frequency Provider Last Rate Last Dose  . 0.9 %  sodium chloride infusion   Intravenous Once Ricki Rodriguez, MD 20 mL/hr at 01/30/11 1029    . labetalol (NORMODYNE,TRANDATE) injection 5 mg  5 mg Intravenous Once Ricki Rodriguez, MD       Medications Prior to Admission  Medication Sig Dispense Refill  . allopurinol (ZYLOPRIM) 300 MG tablet Take 300 mg by mouth every evening.       Marland Kitchen aspirin 81 MG tablet Take 81 mg by mouth daily.        . carbidopa-levodopa (SINEMET) 25-100 MG per tablet Take 1 tablet by mouth 3 (three) times daily.       . clopidogrel (PLAVIX) 75 MG tablet Take 75 mg by mouth every evening.       . escitalopram (LEXAPRO) 20 MG tablet Take 10 mg by mouth daily.        . meclizine (ANTIVERT) 25 MG tablet Take 25 mg by mouth 2 (two) times daily.       . Multiple Vitamin (MULTIVITAMIN) tablet Take 1 tablet by mouth daily.        . simvastatin (ZOCOR) 80 MG tablet Take 80 mg by mouth at bedtime.           Results for orders placed during the hospital encounter of 01/30/11 (from the past 48 hour(s))  PROTIME-INR     Status: Normal   Collection Time   01/30/11  9:35 AM      Component Value Range Comment   Prothrombin Time 13.7  11.6 - 15.2 (seconds)    INR 1.03  0.00 - 1.49    APTT     Status: Normal   Collection Time   01/30/11  9:35 AM      Component Value Range Comment   aPTT 26  24 - 37 (seconds)   CBC     Status: Abnormal   Collection Time   01/30/11  9:35 AM      Component Value Range Comment   WBC 3.5 (*) 4.0 - 10.5 (K/uL)    RBC 4.84  4.22 - 5.81 (MIL/uL)    Hemoglobin 13.5  13.0 - 17.0 (g/dL)    HCT 82.9  56.2 - 13.0 (%)    MCV 88.0  78.0 - 100.0 (fL)    MCH 27.9  26.0 -  34.0 (pg)    MCHC 31.7  30.0 - 36.0 (g/dL)    RDW 45.4  09.8 - 11.9 (%)    Platelets 163  150 - 400 (K/uL)   DIFFERENTIAL     Status: Abnormal   Collection Time   01/30/11  9:35 AM      Component Value Range Comment   Neutrophils Relative 52  43 - 77 (%)    Neutro Abs 1.8  1.7 - 7.7 (K/uL)    Lymphocytes Relative 24  12 - 46 (%)    Lymphs Abs 0.9  0.7 - 4.0 (K/uL)    Monocytes Relative 15 (*) 3 - 12 (%)    Monocytes Absolute 0.5  0.1 - 1.0 (K/uL)    Eosinophils Relative 8 (*) 0 - 5 (%)    Eosinophils Absolute 0.3  0.0 - 0.7 (K/uL)    Basophils Relative 1  0 - 1 (%)    Basophils Absolute 0.0  0.0 - 0.1 (K/uL)   COMPREHENSIVE METABOLIC PANEL     Status: Abnormal   Collection Time   01/30/11  9:35 AM      Component Value Range Comment   Sodium 139  135 - 145 (mEq/L)    Potassium 3.8  3.5 - 5.1 (mEq/L)    Chloride 101  96 - 112 (mEq/L)    CO2 28  19 - 32 (mEq/L)    Glucose, Bld 166 (*) 70 - 99 (mg/dL)    BUN 22  6 - 23 (mg/dL)    Creatinine, Ser 1.47 (*) 0.50 - 1.35 (mg/dL)    Calcium 9.7  8.4 - 10.5 (mg/dL)    Total Protein 7.4  6.0 - 8.3 (g/dL)    Albumin 3.4 (*) 3.5 - 5.2 (g/dL)    AST 17  0 - 37 (U/L)    ALT 8  0 - 53 (U/L)    Alkaline Phosphatase 55  39 - 117 (U/L)    Total Bilirubin  0.5  0.3 - 1.2 (mg/dL)    GFR calc non Af Amer 41 (*) >90 (mL/min)    GFR calc Af Amer 47 (*) >90 (mL/min)   CK TOTAL AND CKMB     Status: Normal   Collection Time   01/30/11  9:36 AM      Component Value Range Comment   Total CK 98  7 - 232 (U/L)    CK, MB 2.3  0.3 - 4.0 (ng/mL)    Relative Index RELATIVE INDEX IS INVALID  0.0 - 2.5    TROPONIN I     Status: Normal   Collection Time   01/30/11  9:36 AM      Component Value Range Comment   Troponin I <0.30  <0.30 (ng/mL)   POCT I-STAT, CHEM 8     Status: Abnormal   Collection Time   01/30/11  9:45 AM      Component Value Range Comment   Sodium 141  135 - 145 (mEq/L)    Potassium 3.9  3.5 - 5.1 (mEq/L)    Chloride 105  96 - 112 (mEq/L)    BUN 24 (*) 6 - 23 (mg/dL)    Creatinine, Ser 8.29 (*) 0.50 - 1.35 (mg/dL)    Glucose, Bld 562 (*) 70 - 99 (mg/dL)    Calcium, Ion 1.30  1.12 - 1.32 (mmol/L)    TCO2 27  0 - 100 (mmol/L)    Hemoglobin 15.3  13.0 - 17.0 (g/dL)    HCT 86.5  78.4 -  52.0 (%)    Ct Head Wo Contrast  01/30/2011  *RADIOLOGY REPORT*  Clinical Data: Code stroke.  Acute onset of aphasia with decreased level of consciousness.  CT HEAD WITHOUT CONTRAST  Technique:  Contiguous axial images were obtained from the base of the skull through the vertex without contrast.  Comparison: CT scan dated 11/05/2008 and brain MRI dated 11/06/2008  Findings: The proximal left middle cerebral artery is slightly dense and on image #9 of series 2 there is a dense branch of the MCA just beyond the trifurcation as compared to multiple prior CT scans.  This could represent an acute thrombosis.   There is no acute intracranial hemorrhage or mass lesion. Multiple old infarcts in the left cerebral hemisphere including left basal ganglia and left posterior frontal region with secondary encephalomalacia. Old right caudate infarct.  Progressive diffuse cerebral cortical atrophy, most severe in the temporal and frontal lobes.  Partial empty sella.  No osseous  abnormality of significance.  IMPRESSION: 1. Subtle increased density in the left middle cerebral artery as described which may represent acute thrombosis. 2.  Multiple old strokes.  Original Report Authenticated By: Gwynn Burly, M.D.    @ROS @ negative for - chills, fatigue, fever, night sweats, weight gain or weight loss  Psychological ROS: negative for - behavioral disorder, hallucinations, memory difficulties, mood swings or suicidal ideation  Ophthalmic ROS: negative for - blurry vision, double vision, eye pain or loss of vision  ENT ROS: negative for - epistaxis, nasal discharge, oral lesions, sore throat, tinnitus or vertigo  Allergy and Immunology ROS: negative for - hives or itchy/watery eyes  Hematological and Lymphatic ROS: negative for - bleeding problems, bruising or swollen lymph nodes  Endocrine ROS: negative for - galactorrhea, hair pattern changes, polydipsia/polyuria or temperature intolerance  Respiratory ROS: negative for - cough, hemoptysis, shortness of breath or wheezing  Cardiovascular ROS: negative for - chest pain, dyspnea on exertion, edema or irregular heartbeat  Gastrointestinal ROS: negative for - abdominal pain, diarrhea, hematemesis, nausea/vomiting or stool incontinence  Genito-Urinary ROS: negative for - dysuria, hematuria, incontinence or urinary frequency/urgency  Musculoskeletal ROS: negative for - joint swelling or muscular weakness  Neurological ROS: as noted in HPI  Dermatological ROS: negative for rash and skin lesion changes   Blood pressure 166/68, pulse 61, temperature 98.1 F (36.7 C), temperature source Oral, resp. rate 17, height 5\' 9"  (1.753 m), weight 72.576 kg (160 lb), SpO2 100.00%. General appearance: alert, cooperative and appears stated age Head: Normocephalic, without obvious abnormality, atraumatic Eyes: conjunctivae/corneas clear. PERRL, EOM's intact. Bilateral lens implants Throat: lips, mucosa, and tongue normal; teeth and gums  normal Neck: no adenopathy, no carotid bruit, no JVD, supple, symmetrical, trachea midline and thyroid not enlarged, symmetric, no tenderness/mass/nodules Resp: clear to auscultation bilaterally Cardio: regular rate and rhythm, S1, S2 normal, II/VI systolic murmur. GI: soft, non-tender; bowel sounds normal; no masses,  no organomegaly Extremities: extremities normal, atraumatic, no cyanosis or edema Skin: Skin color, texture, turgor normal. Neurologic: Alert and oriented X 1. Right sided weakness.  Assessment/Plan Left brain stroke  MRI, Echo, carotid doppler, PT/OT per neurology. Hypertension  Home medications. Parkinson;s disease Coronary artery disease. Renal Insufficiency   Blakeley Margraf S 01/30/2011, 11:37 AM

## 2011-01-30 NOTE — ED Notes (Signed)
Dr. Thad Ranger at bedside, talking with family member.

## 2011-01-30 NOTE — Consult Note (Addendum)
Referring Physician: Lynelle Doctor    Chief Complaint: Difficulty with speech and right sided weakness  HPI: Christopher Cook is an 78 y.o. male with history of stroke about 2 years ago who was with family eating and had acute onset of difficulty with speech.  Was noted to have right sided weakness as well.  Patient was brought in as a code stroke.  Weakness improved after presentation but he continued to have difficulty with speech.  Patient had a stroke 2 years ago but was ambulatory at home and able to dress himself, ambulate independently and take care of his financial affairs with some assistance.  Initial NIHSS of 10  LSN: [id number] tPA Given: No: Improvement in symptoms.  Family unwilling to consent mRankin: 3  Past Medical History  Diagnosis Date  . Lung cancer dx'd 2006    surg only  . Hypertension   . Stroke   . Parkinson disease   . Coronary artery disease   . Gout   . Dementia     Past Surgical History  Procedure Date  . Lasik     No family history on file. Social History:  does not have a smoking history on file. He does not have any smokeless tobacco history on file. He reports that he does not drink alcohol or use illicit drugs.  Allergies: No Known Allergies  Medications: I have reviewed the patient's current medications. Prior to Admission:  Zyloprim, ASA, Sinemet, Plavix, Lexapro, Antivert, MVI, Zocor  ROS: History obtained from daughter-in-law  General ROS: negative for - chills, fatigue, fever, night sweats, weight gain or weight loss Psychological ROS: negative for - behavioral disorder, hallucinations, memory difficulties, mood swings or suicidal ideation Ophthalmic ROS: negative for - blurry vision, double vision, eye pain or loss of vision ENT ROS: negative for - epistaxis, nasal discharge, oral lesions, sore throat, tinnitus or vertigo Allergy and Immunology ROS: negative for - hives or itchy/watery eyes Hematological and Lymphatic ROS: negative for - bleeding  problems, bruising or swollen lymph nodes Endocrine ROS: negative for - galactorrhea, hair pattern changes, polydipsia/polyuria or temperature intolerance Respiratory ROS: negative for - cough, hemoptysis, shortness of breath or wheezing Cardiovascular ROS: negative for - chest pain, dyspnea on exertion, edema or irregular heartbeat Gastrointestinal ROS: negative for - abdominal pain, diarrhea, hematemesis, nausea/vomiting or stool incontinence Genito-Urinary ROS: negative for - dysuria, hematuria, incontinence or urinary frequency/urgency Musculoskeletal ROS: negative for - joint swelling or muscular weakness Neurological ROS: as noted in HPI Dermatological ROS: negative for rash and skin lesion changes  Physical Examination: Blood pressure 145/65, pulse 74, temperature 98.1 F (36.7 C), temperature source Oral, resp. rate 16, height 5\' 9"  (1.753 m), weight 72.576 kg (160 lb), SpO2 100.00%.  Neurologic Examination: Mental Status: Alert. Oriented to name.  Patient unable to follow commands reproducibly.  Speech nonfluent and often unintelligible. Cranial Nerves: II: RHH, pupils equal, round, reactive to light and accommodation III,IV, VI: ptosis not present, extra-ocular motions intact bilaterally V,VII: smile symmetric, facial light touch sensation normal bilaterally VIII: hearing normal bilaterally IX,X: gag reflex present XI: trapezius strength/neck flexion strength normal bilaterally XII: tongue strength normal  Motor: Right : Upper extremity   5-/5    Left:     Upper extremity   5/5  Lower extremity   4/5     Lower extremity   5/5 Tone and bulk:normal tone throughout; no atrophy noted Sensory: Pinprick and light touch intact throughout, bilaterally Deep Tendon Reflexes: 2+ and symmetric in the upper extremities, 1+  at the knees and absent at the ankles Plantars: Right: equivocal   Left: downgoing Cerebellar: Unable to perform   Results for orders placed during the hospital  encounter of 01/30/11 (from the past 48 hour(s))  PROTIME-INR     Status: Normal   Collection Time   01/30/11  9:35 AM      Component Value Range Comment   Prothrombin Time 13.7  11.6 - 15.2 (seconds)    INR 1.03  0.00 - 1.49    APTT     Status: Normal   Collection Time   01/30/11  9:35 AM      Component Value Range Comment   aPTT 26  24 - 37 (seconds)   CBC     Status: Abnormal   Collection Time   01/30/11  9:35 AM      Component Value Range Comment   WBC 3.5 (*) 4.0 - 10.5 (K/uL)    RBC 4.84  4.22 - 5.81 (MIL/uL)    Hemoglobin 13.5  13.0 - 17.0 (g/dL)    HCT 78.2  95.6 - 21.3 (%)    MCV 88.0  78.0 - 100.0 (fL)    MCH 27.9  26.0 - 34.0 (pg)    MCHC 31.7  30.0 - 36.0 (g/dL)    RDW 08.6  57.8 - 46.9 (%)    Platelets 163  150 - 400 (K/uL)   DIFFERENTIAL     Status: Abnormal   Collection Time   01/30/11  9:35 AM      Component Value Range Comment   Neutrophils Relative 52  43 - 77 (%)    Neutro Abs 1.8  1.7 - 7.7 (K/uL)    Lymphocytes Relative 24  12 - 46 (%)    Lymphs Abs 0.9  0.7 - 4.0 (K/uL)    Monocytes Relative 15 (*) 3 - 12 (%)    Monocytes Absolute 0.5  0.1 - 1.0 (K/uL)    Eosinophils Relative 8 (*) 0 - 5 (%)    Eosinophils Absolute 0.3  0.0 - 0.7 (K/uL)    Basophils Relative 1  0 - 1 (%)    Basophils Absolute 0.0  0.0 - 0.1 (K/uL)    Ct Head Wo Contrast  01/30/2011  *RADIOLOGY REPORT*  Clinical Data: Code stroke.  Acute onset of aphasia with decreased level of consciousness.  CT HEAD WITHOUT CONTRAST  Technique:  Contiguous axial images were obtained from the base of the skull through the vertex without contrast.  Comparison: CT scan dated 11/05/2008 and brain MRI dated 11/06/2008  Findings: The proximal left middle cerebral artery is slightly dense and on image #9 of series 2 there is a dense branch of the MCA just beyond the trifurcation as compared to multiple prior CT scans.  This could represent an acute thrombosis.   There is no acute intracranial hemorrhage or mass  lesion. Multiple old infarcts in the left cerebral hemisphere including left basal ganglia and left posterior frontal region with secondary encephalomalacia. Old right caudate infarct.  Progressive diffuse cerebral cortical atrophy, most severe in the temporal and frontal lobes.  Partial empty sella.  No osseous abnormality of significance.  IMPRESSION: 1. Subtle increased density in the left middle cerebral artery as described which may represent acute thrombosis. 2.  Multiple old strokes.  Original Report Authenticated By: Gwynn Burly, M.D.    Assessment: 77 y.o. male presenting with global aphasia, [facility] and mild right hemiparesis.  Suspect left brain stroke.  Dense MCA sign seen  on imaging.  Patient a candidate for tPA but family unclear if they want to take the risk.  While awaiting son to arrive patient had improvement in symptoms.  Now at a NIHSS of 3.  Decision made to not administer tPA.  Patient on Plavix and ASA at home.    Stroke Risk Factors - hyperlipidemia, hypertension and stroke in the past  Plan: 1. HgbA1c, fasting lipid panel 2. MRI, MRA  of the brain without contrast 3. PT consult, OT consult, Speech consult 4. Echocardiogram 5. Carotid dopplers 6. Prophylactic therapy-continue ASA and Plavix 7. EEG-patient having staring episodes with inability to respond. Would rule out seizure. 8. Telemetry monitoring   Thana Farr, MD [facility] [phone] 01/30/2011, 10:08 AM

## 2011-01-30 NOTE — ED Notes (Addendum)
Respirations even & unlabored, no distress.  No voiced complaints presently. NAD. Awaiting bed assignment. Family remains at bedside Continues to improve with aphasia per family

## 2011-01-30 NOTE — ED Notes (Signed)
Admitting MD at bedside.

## 2011-01-30 NOTE — ED Notes (Signed)
Family at bedside. 

## 2011-01-30 NOTE — ED Notes (Signed)
Family reports [id number] while eating breakfast pt suddenly stopped talking, RUE weakness, right facial droop. Per EMS - reports facial droop & RUE weakness improved enroute to ED

## 2011-01-30 NOTE — ED Provider Notes (Signed)
History     CSN: [id number]  Arrival date & time 01/30/11  7829   First MD Initiated Contact with Patient 01/30/11 [id number]      Chief Complaint  Patient presents with  . Code Stroke   Level V caveat for dysphasia  (Consider location/radiation/quality/duration/timing/severity/associated sxs/prior treatment) HPI  History obtained from daughter-in-law whom he lives with. She relates patient has history of staring spells and this morning he was sitting at the kitchen table with his son about 845 he was noted to have a staring spell which took him much longer than usual to come out of. Family then noticed he had some right-sided weakness. She relates he fell last night onto his right side but states he did not hit is head. She states is not uncommon for him to fall. Patient has history of stroke about 2 years ago and it affected his speech and he had some memory problems after that.   PCP Dr Delane Ginger Cardiologist Dr Sharyn Lull    Past Medical History  Diagnosis Date  . Lung cancer dx'd 2006    surg only  . Hypertension   . Stroke   . Parkinson disease   . Coronary artery disease   . Gout   . Dementia    stroke  Parkinson's disease Coronary artery disease   Past Surgical History  Procedure Date  . Lasik     No family history on file.  History  Substance Use Topics  . Smoking status: No  . Smokeless tobacco: Not on file  . Alcohol Use: No  Lives with son at home Uses a walker at times    Review of Systems  Unable to perform ROS   Allergies  Review of patient's allergies indicates no known allergies.  Home Medications   Current Outpatient Rx  Name Route Sig Dispense Refill  . ALLOPURINOL 300 MG PO TABS Oral Take 300 mg by mouth daily.      . ASPIRIN 81 MG PO TABS Oral Take 81 mg by mouth daily.      Marland Kitchen CARBIDOPA-LEVODOPA 25-100 MG PO TABS Oral Take 1 tablet by mouth 2 (two) times daily before lunch and supper.      Marland Kitchen CLOPIDOGREL BISULFATE 75 MG PO TABS Oral Take  75 mg by mouth daily.      Marland Kitchen ESCITALOPRAM OXALATE 20 MG PO TABS Oral Take 10 mg by mouth daily.      Marland Kitchen MECLIZINE HCL 25 MG PO TABS Oral Take 25 mg by mouth 3 (three) times daily as needed.      Marland Kitchen ONE-DAILY MULTI VITAMINS PO TABS Oral Take 1 tablet by mouth daily.      Marland Kitchen SIMVASTATIN 80 MG PO TABS Oral Take 80 mg by mouth at bedtime.        BP 145/65  Pulse 74  Temp(Src) 98.1 F (36.7 C) (Oral)  Resp 16  Ht 5\' 9"  (1.753 m)  Wt 160 lb (72.576 kg)  BMI 23.63 kg/m2  SpO2 100%  Vital signs normal     Physical Exam  Constitutional: He appears well-developed and well-nourished.  Non-toxic appearance. He does not appear ill. No distress.  HENT:  Head: Normocephalic and atraumatic.  Right Ear: External ear normal.  Left Ear: External ear normal.  Nose: No mucosal edema or rhinorrhea.  Mouth/Throat: Mucous membranes are normal. No dental abscesses or uvula swelling.  Eyes: Conjunctivae and EOM are normal. Pupils are equal, round, and reactive to light.  Neck: Full passive range of  motion without pain.  Cardiovascular: Normal rate, regular rhythm and normal heart sounds.  Exam reveals no gallop and no friction rub.   No murmur heard. Pulmonary/Chest: Effort normal and breath sounds normal. No respiratory distress. He has no wheezes. He has no rhonchi. He has no rales. He exhibits no tenderness and no crepitus.  Abdominal: Normal appearance.  Musculoskeletal: Normal range of motion. He exhibits no edema and no tenderness.       Patient is stiff in all extremities  Neurological: He has normal strength. No cranial nerve deficit.       Patient appears to have some mild right-sided weakness. Full neurological exam was done by Dr. Thad Ranger, neurologist and the stroke team. He also is noted to have a right visual field defect and ignoring his right side.  Skin: Skin is warm, dry and intact. No rash noted. No erythema. No pallor.  Psychiatric: His speech is normal. His mood appears not anxious.         Affect flat    ED Course  Procedures (including critical care time)  Pt cleared by Dr Jeraldine Loots to go to CT scan  10:00 Dr Thad Ranger talking to DIL about doing tPA, she doesn't feel the family will want to do it, husband (patient's son) is on the way to ED  10:30 Dr Thad Ranger has talked to son and DIL and they have decided to do conservative treatment.   10:56 Dr Thad Ranger has asked to have medicine admit patient.   11:28 Dr Algie Coffer here in ED and will admit  Results for orders placed during the hospital encounter of 01/30/11  PROTIME-INR      Component Value Range   Prothrombin Time 13.7  11.6 - 15.2 (seconds)   INR 1.03  0.00 - 1.49   APTT      Component Value Range   aPTT 26  24 - 37 (seconds)  CBC      Component Value Range   WBC 3.5 (*) 4.0 - 10.5 (K/uL)   RBC 4.84  4.22 - 5.81 (MIL/uL)   Hemoglobin 13.5  13.0 - 17.0 (g/dL)   HCT 56.2  13.0 - 86.5 (%)   MCV 88.0  78.0 - 100.0 (fL)   MCH 27.9  26.0 - 34.0 (pg)   MCHC 31.7  30.0 - 36.0 (g/dL)   RDW 78.4  69.6 - 29.5 (%)   Platelets 163  150 - 400 (K/uL)  DIFFERENTIAL      Component Value Range   Neutrophils Relative 52  43 - 77 (%)   Neutro Abs 1.8  1.7 - 7.7 (K/uL)   Lymphocytes Relative 24  12 - 46 (%)   Lymphs Abs 0.9  0.7 - 4.0 (K/uL)   Monocytes Relative 15 (*) 3 - 12 (%)   Monocytes Absolute 0.5  0.1 - 1.0 (K/uL)   Eosinophils Relative 8 (*) 0 - 5 (%)   Eosinophils Absolute 0.3  0.0 - 0.7 (K/uL)   Basophils Relative 1  0 - 1 (%)   Basophils Absolute 0.0  0.0 - 0.1 (K/uL)  COMPREHENSIVE METABOLIC PANEL      Component Value Range   Sodium 139  135 - 145 (mEq/L)   Potassium 3.8  3.5 - 5.1 (mEq/L)   Chloride 101  96 - 112 (mEq/L)   CO2 28  19 - 32 (mEq/L)   Glucose, Bld 166 (*) 70 - 99 (mg/dL)   BUN 22  6 - 23 (mg/dL)   Creatinine, Ser 2.84 (*) 0.50 -  1.35 (mg/dL)   Calcium 9.7  8.4 - 16.1 (mg/dL)   Total Protein 7.4  6.0 - 8.3 (g/dL)   Albumin 3.4 (*) 3.5 - 5.2 (g/dL)   AST 17  0 - 37 (U/L)   ALT 8   0 - 53 (U/L)   Alkaline Phosphatase 55  39 - 117 (U/L)   Total Bilirubin 0.5  0.3 - 1.2 (mg/dL)   GFR calc non Af Amer 41 (*) >90 (mL/min)   GFR calc Af Amer 47 (*) >90 (mL/min)  CK TOTAL AND CKMB      Component Value Range   Total CK 98  7 - 232 (U/L)   CK, MB 2.3  0.3 - 4.0 (ng/mL)   Relative Index RELATIVE INDEX IS INVALID  0.0 - 2.5   TROPONIN I      Component Value Range   Troponin I <0.30  <0.30 (ng/mL)   Laboratory interpretation all normal except hyperglycemia, renal insufficiency   Ct Head Wo Contrast  01/30/2011  *RADIOLOGY REPORT*  Clinical Data: Code stroke.  Acute onset of aphasia with decreased level of consciousness.  CT HEAD WITHOUT CONTRAST  Technique:  Contiguous axial images were obtained from the base of the skull through the vertex without contrast.  Comparison: CT scan dated 11/05/2008 and brain MRI dated 11/06/2008  Findings: The proximal left middle cerebral artery is slightly dense and on image #9 of series 2 there is a dense branch of the MCA just beyond the trifurcation as compared to multiple prior CT scans.  This could represent an acute thrombosis.   There is no acute intracranial hemorrhage or mass lesion. Multiple old infarcts in the left cerebral hemisphere including left basal ganglia and left posterior frontal region with secondary encephalomalacia. Old right caudate infarct.  Progressive diffuse cerebral cortical atrophy, most severe in the temporal and frontal lobes.  Partial empty sella.  No osseous abnormality of significance.  IMPRESSION: 1. Subtle increased density in the left middle cerebral artery as described which may represent acute thrombosis. 2.  Multiple old strokes.  Original Report Authenticated By: Gwynn Burly, M.D.      1. Stroke      Date: 01/30/2011  Rate: 69  Rhythm: normal sinus rhythm  QRS Axis: left  Intervals: PR prolonged  ST/T Wave abnormalities: nonspecific ST/T changes  Conduction Disutrbances:right bundle branch  block  Narrative Interpretation:   Old EKG Reviewed: none available  Plan admission  Devoria Albe, MD, FACEP   CRITICAL CARE Performed by: Devoria Albe L   Total critical care time: 32 minutes  Critical care time was exclusive of separately billable procedures and treating other patients.  Critical care was necessary to treat or prevent imminent or life-threatening deterioration.  Critical care was time spent personally by me on the following activities: development of treatment plan with patient and/or surrogate as well as nursing, discussions with consultants, evaluation of patient's response to treatment, examination of patient, obtaining history from patient or surrogate, ordering and performing treatments and interventions, ordering and review of laboratory studies, ordering and review of radiographic studies, pulse oximetry and re-evaluation of patient's condition.   MDM           Ward Givens, MD 01/30/11 2025

## 2011-01-30 NOTE — ED Notes (Signed)
Pt speech & expressive aphasia continues to improve.

## 2011-01-31 ENCOUNTER — Ambulatory Visit (HOSPITAL_COMMUNITY): Payer: Medicare Other

## 2011-01-31 ENCOUNTER — Inpatient Hospital Stay (HOSPITAL_COMMUNITY): Payer: Medicare Other

## 2011-01-31 LAB — BASIC METABOLIC PANEL
Calcium: 9.9 mg/dL (ref 8.4–10.5)
Creatinine, Ser: 1.1 mg/dL (ref 0.50–1.35)
GFR calc non Af Amer: 58 mL/min — ABNORMAL LOW (ref >90)
Glucose, Bld: 92 mg/dL (ref 70–99)
Sodium: 142 mEq/L (ref 135–145)

## 2011-01-31 LAB — LIPID PANEL
LDL Cholesterol: 76 mg/dL (ref 0–99)
Triglycerides: 97 mg/dL (ref <150)
VLDL: 19 mg/dL (ref 0–40)

## 2011-01-31 LAB — CBC
Hemoglobin: 12.8 g/dL — ABNORMAL LOW (ref 13.0–17.0)
MCH: 27.9 pg (ref 26.0–34.0)
MCHC: 31.8 g/dL (ref 30.0–36.0)
MCV: 87.8 fL (ref 78.0–100.0)

## 2011-01-31 LAB — HEMOGLOBIN A1C: Hgb A1c MFr Bld: 6.1 % — ABNORMAL HIGH (ref <5.7)

## 2011-01-31 MED ORDER — ASPIRIN 81 MG PO CHEW
81.0000 mg | CHEWABLE_TABLET | Freq: Every day | ORAL | Status: DC
  Administered 2011-02-01 – 2011-02-03 (×3): 81 mg via ORAL
  Filled 2011-01-31 (×3): qty 1

## 2011-01-31 MED ORDER — SODIUM CHLORIDE 0.9 % IV SOLN
INTRAVENOUS | Status: DC
  Administered 2011-01-31 – 2011-02-02 (×2): via INTRAVENOUS

## 2011-01-31 MED ORDER — ENOXAPARIN SODIUM 40 MG/0.4ML ~~LOC~~ SOLN
40.0000 mg | SUBCUTANEOUS | Status: DC
  Administered 2011-01-31 – 2011-02-02 (×2): 40 mg via SUBCUTANEOUS
  Filled 2011-01-31 (×4): qty 0.4

## 2011-01-31 MED ORDER — SODIUM CHLORIDE 0.9 % IV BOLUS (SEPSIS)
250.0000 mL | Freq: Once | INTRAVENOUS | Status: AC
  Administered 2011-01-31: 250 mL via INTRAVENOUS

## 2011-01-31 NOTE — Progress Notes (Signed)
VASCULAR LAB PRELIMINARY     Bilateral:  No evidence of hemodynamically significant internal carotid artery stenosis.   Vertebral artery flow is antegrade.      Mila Homer, 01/31/2011, 11:58 AM     Preliminary Report  Bilateral:  No evidence of hemodynamically significant internal carotid artery stenosis.   Vertebral artery flow is antegrade.

## 2011-01-31 NOTE — Progress Notes (Signed)
Stroke Team Progress Note  HISTORY Christopher Cook is an 78 y.o. male with history of stroke about 2 years ago who was with family eating and had acute onset of difficulty with speech. Was noted to have right sided weakness as well. Patient was brought in as a code stroke. Weakness improved after presentation but he continued to have difficulty with speech. Patient had a stroke 2 years ago but was ambulatory at home and able to dress himself, ambulate independently and take care of his financial affairs with some assistance. Initial NIHSS of 10  SUBJECTIVE Currently in vascular lab undergoing 2D echo. Grossly confused. Overall he feels his condition is stable.  OBJECTIVE Most recent Vital Signs: Temp: 97.5 F (36.4 C) (01/29 0516) Temp src: Oral (01/29 0516) BP: 157/90 mmHg (01/29 0516) Pulse Rate: 59  (01/29 0516) Respiratory Rate: 18 O2 Saturation: 97%  CBG (last 3) No results found for this basename: GLUCAP:3 in the last 72 hours Intake/Output from previous day:   IV Fluid Intake:    Medications    . allopurinol  300 mg Oral QPM  . amLODipine  5 mg Oral Daily  . aspirin  300 mg Rectal Daily   Or  . aspirin  325 mg Oral Daily  . carbidopa-levodopa  1 tablet Oral TID  . clopidogrel  75 mg Oral QPM  . enoxaparin  30 mg Subcutaneous Q24H  . escitalopram  10 mg Oral Daily  . meclizine  25 mg Oral BID  . rosuvastatin  20 mg Oral q1800  PRN senna-docusate  Diet:  Cardiac thin liquids Activity:  Bedrest  DVT Prophylaxis:  Lovenox 30 mg sq daily   Significant Diagnostic Studies: CBC    Component Value Date/Time   WBC 2.3* 01/31/2011 0610   WBC 2.4* 12/22/2010 0919   RBC 4.59 01/31/2011 0610   RBC 4.50 12/22/2010 0919   HGB 12.8* 01/31/2011 0610   HGB 12.5* 12/22/2010 0919   HCT 40.3 01/31/2011 0610   HCT 39.3 12/22/2010 0919   PLT 138* 01/31/2011 0610   PLT 156 12/22/2010 0919   MCV 87.8 01/31/2011 0610   MCV 87.3 12/22/2010 0919   MCH 27.9 01/31/2011 0610   MCH 27.8  12/22/2010 0919   MCHC 31.8 01/31/2011 0610   MCHC 31.8* 12/22/2010 0919   RDW 15.4 01/31/2011 0610   RDW 15.7* 12/22/2010 0919   LYMPHSABS 0.9 01/30/2011 0935   LYMPHSABS 0.7* 12/22/2010 0919   MONOABS 0.5 01/30/2011 0935   MONOABS 0.4 12/22/2010 0919   EOSABS 0.3 01/30/2011 0935   EOSABS 0.3 12/22/2010 0919   BASOSABS 0.0 01/30/2011 0935   BASOSABS 0.0 12/22/2010 0919   CMP    Component Value Date/Time   NA 142 01/31/2011 0610   NA 135 03/14/2010 1022   K 4.0 01/31/2011 0610   K 4.3 03/14/2010 1022   CL 105 01/31/2011 0610   CL 98 03/14/2010 1022   CO2 31 01/31/2011 0610   CO2 29 03/14/2010 1022   GLUCOSE 92 01/31/2011 0610   GLUCOSE 107 03/14/2010 1022   BUN 17 01/31/2011 0610   BUN 16 03/14/2010 1022   CREATININE 1.10 01/31/2011 0610   CREATININE 1.7* 03/14/2010 1022   CALCIUM 9.9 01/31/2011 0610   CALCIUM 9.5 03/14/2010 1022   PROT 7.4 01/30/2011 0935   PROT 7.5 03/14/2010 1022   ALBUMIN 3.4* 01/30/2011 0935   AST 17 01/30/2011 0935   AST 23 03/14/2010 1022   ALT 8 01/30/2011 0935   ALKPHOS 55 01/30/2011 0935  ALKPHOS 46 03/14/2010 1022   BILITOT 0.5 01/30/2011 0935   BILITOT 0.70 03/14/2010 1022   GFRNONAA 58* 01/31/2011 0610   GFRAA 68* 01/31/2011 0610   COAGS Lab Results  Component Value Date   INR 1.03 01/30/2011   INR 1.01 04/06/2010   INR 1.02 11/05/2008   Lipid Panel    Component Value Date/Time   CHOL 141 01/31/2011 0610   TRIG 97 01/31/2011 0610   HDL 46 01/31/2011 0610   CHOLHDL 3.1 01/31/2011 0610   VLDL 19 01/31/2011 0610   LDLCALC 76 01/31/2011 0610   HgbA1C  Lab Results  Component Value Date   HGBA1C 6.1* 01/31/2011   Urine Drug Screen  No results found for this basename: labopia,  cocainscrnur,  labbenz,  amphetmu,  thcu,  labbarb    Alcohol Level No results found for this basename: eth   CT of the brain  1. Subtle increased density in the left middle cerebral artery as described which may represent acute thrombosis. 2.  Multiple old strokes.    MRI of the brain    No acute infarct.  Remote infarcts and small vessel disease type changes.  Global atrophy.  Ventricular prominence may be related to atrophy although difficult to completely exclude a mild component hydrocephalus.  Paranasal sinus mucosal thickening most notable involving the right maxillary sinus.  MRA of the brain   Minimal progression of atherosclerotic type changes   2D Echocardiogram  Performed, results pending  Carotid Doppler  No internal carotid artery stenosis bilaterally. Vertebrals with antegrade flow bilaterally.   CXR  Stable postoperative changes on the left.  Right mid lung soft tissue density.  On prior chest CT, there appears to be treatment changes within this portion of the right lung and I suspect this represents scarring.  This is unchanged since prior chest CT.    EKG  unchanged from previous tracings, normal sinus rhythm, RBBB, bifascicular block, PAC's noted, 1st degree AV block, left anterior fascicular block.   Physical Exam   Pleasant elderly African American gentleman who is not in distress. Afebrile head is nontraumatic. Neck is supple without bruit. Cardiac exam no murmur or gallop. Lungs clear to auscultation. Distal pulses well felt.  Neurological exam awake alert oriented to place and person but not to time. Decreased attention, short-term memory and recall. Follows simple one and two-step commands. Speech is slightly pressured but no dysarthria or aphasia. Extraocular movements are full range without nystagmus. Blink to threat bilaterally. Motor system exam reveals symmetric upper and lower extremity strength. Sensation, coordination and gait was not tested. ASSESSMENT Mr. Eldrige Pitkin is a 78 y.o. male with a seizure, postictal today with confusion. Ambulation back to baseline. MRI negative for stroke. On Keppra for seizure. ASA 81 and Plavix 75mg  prior to admission. ASA increased to 325 in hospital.  Hospital day # 1  TREATMENT/PLAN Decrease ASA to 81mg  as  prior to admission. Continue  aspirin 81 mg orally every day and clopidogrel 75 mg orally every day for secondary stroke prevention. EEG pending. Will follow up.  Joaquin Music, ANP-BC, GNP-BC [facility] Pager: [phone] 01/31/2011 1:04 PM  Dr. Delia Heady, Stroke Center Medical Director, has personally reviewed chart, pertinent data, examined the patient and developed the plan of care.

## 2011-01-31 NOTE — Evaluation (Signed)
Physical Therapy Evaluation Patient Details Name: Christopher Cook MRN: [id number] DOB: 1921-11-26 Today's Date: 01/31/2011  Problem List:  Patient Active Problem List  Diagnoses  . Parkinson disease  . Gout    Past Medical History:  Past Medical History  Diagnosis Date  . Lung cancer dx'd 2006    surg only  . Hypertension   . Stroke   . Parkinson disease   . Coronary artery disease   . Gout   . Dementia   . Angina   . Shortness of breath    Past Surgical History:  Past Surgical History  Procedure Date  . Lasik   . Coronary angioplasty with stent placement   . Lobectomy     PT Assessment/Plan/Recommendation PT Assessment Clinical Impression Statement: Patient presents with symptoms suggestive of possible CVA, his initial NIHSS was 10. Today patient noted to have some cognitive issues and mobility issues that appeared more related to old stroke and parkinsons disease. Patient had not has carbidopa since 22.20, so symptoms may have been exagerrated. Patient will benefit from PT in the acure care setting to improve safety with mobility and balance to decrease risk of fall at discharge. I have discussed my finding with patient's son who is requesting SNF to improve function pre return home. PT Recommendation/Assessment: Patient will need skilled PT in the acute care venue PT Problem List: Decreased balance;Decreased mobility;Decreased cognition;Decreased knowledge of use of DME;Decreased safety awareness;Decreased knowledge of precautions;Impaired sensation PT Therapy Diagnosis : Difficulty walking;Abnormality of gait PT Plan PT Frequency: Min 3X/week PT Treatment/Interventions: DME instruction;Gait training;Stair training;Functional mobility training;Therapeutic activities;Therapeutic exercise;Balance training;Cognitive remediation;Patient/family education PT Recommendation Follow Up Recommendations: Skilled nursing facility Equipment Recommended: None recommended by PT PT  Goals  Acute Rehab PT Goals PT Goal Formulation: With patient/family Time For Goal Achievement: 2 weeks Pt will go Supine/Side to Sit: with supervision PT Goal: Supine/Side to Sit - Progress: Goal set today Pt will Sit at [facility] of Bed: with supervision;3-5 min;with no upper extremity support PT Goal: Sit at Edge Of Bed - Progress: Goal set today Pt will go Sit to Supine/Side: with supervision PT Goal: Sit to Supine/Side - Progress: Goal set today Pt will go Sit to Stand: with supervision;with upper extremity assist PT Goal: Sit to Stand - Progress: Goal set today Pt will go Stand to Sit: with supervision PT Goal: Stand to Sit - Progress: Goal set today Pt will Stand: with bilateral upper extremity support;with supervision PT Goal: Stand - Progress: Goal set today Pt will Ambulate: >150 feet;with supervision;with least restrictive assistive device PT Goal: Ambulate - Progress: Goal set today  PT Evaluation Precautions/Restrictions  Precautions Precautions: Fall Prior Functioning  Home Living Lives With: Family Receives Help From: Family (can be up to 24 hour care) Type of Home: House Home Layout: Two level;Able to live on main level with bedroom/bathroom Home Access: Stairs to enter Entrance Stairs-Rails: Left;Right;Can reach both Entrance Stairs-Number of Steps: 5 Bathroom Shower/Tub: Engineer, manufacturing systems: Handicapped height Home Adaptive Equipment: Walker - rolling;Shower chair with back Prior Function Level of Independence: Independent with basic ADLs;Independent with transfers;Requires assistive device for independence;Independent with gait Driving: No Vocation: Retired Comments: Episodes of falling in the past. Over the past year approx 10 falls Cognition Cognition Arousal/Alertness: Awake/alert Overall Cognitive Status: History of cognitive impairments History of Cognitive Impairment: Decline in baseline functioning Orientation Level: Oriented to  person;Oriented to place;Disoriented to time;Disoriented to situation Following Commands: Follows multi-step commands inconsistently Awareness of Errors: Decreased awareness of errors made  Decreased Awareness of Errors: Assistance required to correct errors made Awareness of Errors - Other Comments: steers walker to left and needs constant cues to correct Problem Solving: Requires assistance for problem solving Sensation/Coordination Sensation Proprioception: Impaired by gross assessment Additional Comments: Decreased at foot/ankle Extremity Assessment RLE Assessment RLE Assessment: Within Functional Limits LLE Assessment LLE Assessment: Within Functional Limits Mobility (including Balance) Bed Mobility Bed Mobility: Yes Supine to Sit: 4: Min assist Supine to Sit Details (indicate cue type and reason): Patient with difficulty elevating trunk - he tends to inch up gradually using right upper extremity, but he has difficulty maintaining his balance seondary to right and posterior lean  Sitting - Scoot to Edge of Bed: 3: Mod assist Sitting - Scoot to Edge of Bed Details (indicate cue type and reason): ? difficulty following command so had to initiate despite tactile and verbal cues fro task completion Sit to Supine: 5: Supervision Sit to Supine - Details (indicate cue type and reason): Increased time to raise legs into bed Transfers Transfers: Yes Sit to Stand: 3: Mod assist;From bed;With upper extremity assist Sit to Stand Details (indicate cue type and reason): Verbal and tactile cues for hand placement as attempts to stand by pulling up on walker. Poor anterior weight shift on first stand leading to posterior and right lean. Worked on reaching forward and repeated task resulting in improved transition to stand. Hand over hand for correct hand placement Stand to Sit: 3: Mod assist;To bed;With upper extremity assist Stand to Sit Details: Hand over hand for safe hand placement and assistance  to control descent and prevent retropulsion. Ambulation/Gait Ambulation/Gait: Yes Ambulation/Gait Assistance: 3: Mod assist Ambulation/Gait Assistance Details (indicate cue type and reason): Assistance to guide rolling walker in desired direction. Verbal and tactile cues for posture. Imbalance with side stepping and turns. Ambulation Distance (Feet): 150 Feet Assistive device: Rolling walker Gait Pattern: Step-through pattern;Decreased stride length  Posture/Postural Control Posture/Postural Control: Postural limitations Postural Limitations: Posterior lean/retropulsion in sitting and standing Static Sitting Balance Static Sitting - Balance Support: Bilateral upper extremity supported;Feet supported Static Sitting - Level of Assistance: 5: Stand by assistance Static Sitting - Comment/# of Minutes: secondary to retropulsion - increases with increased time in sitting Static Standing Balance Static Standing - Balance Support: No upper extremity supported Static Standing - Level of Assistance: 3: Mod assist Static Standing - Comment/# of Minutes: secondary to posterior lean/retropulsion Exercise   Reaching forward to floor with return to midline and reaching across body - right hand to left foot End of Session PT - End of Session Equipment Utilized During Treatment: Gait belt Activity Tolerance: Patient tolerated treatment well Patient left: in bed;with call bell in reach Nurse Communication: Mobility status for transfers;Mobility status for ambulation General Behavior During Session: [facility] for tasks performed Cognition: Impaired, at baseline  Edwyna Perfect, PT  Pager [phone]  01/31/2011, 11:10 AM

## 2011-01-31 NOTE — Progress Notes (Signed)
Speech Language/Pathology Speech Language Pathology Evaluation Patient Details Name: Christopher Cook MRN: [id number] DOB: [birth year] Today's Date: 01/31/2011  GENERAL:  78 yr old admitted with right side weakness.  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  MRI did not reveal acute abnormalities.  Problem List:  Patient Active Problem List  Diagnoses  . Parkinson disease  . Gout   Past Medical History:  Past Medical History  Diagnosis Date  . Lung cancer dx'd 2006    surg only  . Hypertension   . Stroke   . Parkinson disease   . Coronary artery disease   . Gout   . Dementia   . Angina   . Shortness of breath    Past Surgical History:  Past Surgical History  Procedure Date  . Lasik   . Coronary angioplasty with stent placement   . Lobectomy     SLP Assessment/Plan/Recommendation Assessment Clinical Impression Statement: Pt. exhibiting moderate aphasia (expressive greater than receptive) and cognitive deficts.  Suspect baseline aphasia due to history of remote left CVA's, however, no family present to confirm.  Progress notes indicated families perception of language abilities have improved since admission.  Pt. would benefit from ST in acute care and after discharge. SLP Recommendation/Assessment: Patient will need skilled Speech Lanaguage Pathology Services in the acute care venue to address identified deficits Problem List: Auditory comprehension;Verbal expression;Orientation;Attention;Memory Therapy Diagnosis: Aphasia;Cognitive Impairments Plan Speech Therapy Frequency: min 2x/week Duration: 2 weeks Treatment/Interventions: Language facilitation;Environmental controls;Internal/external aids;Functional tasks;Multimodal communcation approach;SLP instruction and feedback;Compensatory strategies;Patient/family education Potential to Achieve Goals: Good Potential Considerations: Ability to learn/carryover  information;Co-morbidities SLP Recommendations Follow up Recommendations: Home health SLP Equipment Recommended: None recommended by PT Individuals Consulted Consulted and Agree with Results and Recommendations: Patient  SLP Goals  SLP Goals Potential to Achieve Goals: Good Potential Considerations: Ability to learn/carryover information;Co-morbidities SLP Goal #1: Pt. will increase comprehension for 2 step commands in functional ADL's with min cues to 80%. SLP Goal #2: Pt. will express basic needs/thoughts with semantic question cues with mod assist. SLP Goal #3: Pt. will increase comprehension for short paragraphs read with mod cues.  SLP Evaluation Prior Functioning Cognitive/Linguistic Baseline: Baseline deficits Baseline deficit details:  (suspect aphasia with old left CVA's, no family present) Type of Home: House Lives With:  (Difficulty stating due to aphasia.  Affirmed he does NOT liv) Receives Help From: Family (can be up to 24 hour care) Vocation: Retired  IT consultant Overall Cognitive Status: Impaired Arousal/Alertness: Awake/alert Orientation Level: Oriented to person;Oriented to place Attention: Selective Memory: Impaired Memory Impairment: Decreased short term memory;Storage deficit;Retrieval deficit;Decreased recall of new information Awareness: Impaired Awareness Impairment: Intellectual impairment;Anticipatory impairment Problem Solving: Impaired Problem Solving Impairment: Verbal complex Safety/Judgment: Impaired  Comprehension Auditory Comprehension Overall Auditory Comprehension: Impaired Yes/No Questions: Impaired Basic Biographical Questions: 76-100% accurate Basic Immediate Environment Questions: 0-24% accurate Commands: Impaired Two Step Basic Commands: 75-100% accurate Conversation: Simple EffectiveTechniques: Extra processing time;Repetition Visual Recognition/Discrimination Discrimination: Not tested Reading Comprehension Reading Status:  Impaired Paragraph Level: Impaired Functional Environmental (signs, name badge): Impaired Interfering Components: Attention;Visual scanning  Expression Expression Primary Mode of Expression: Verbal Verbal Expression Overall Verbal Expression: Impaired Initiation: No impairment Automatic Speech: Month of year Level of Generative/Spontaneous Verbalization: Sentence Naming: Impairment Responsive: Not tested Confrontation: Impaired Convergent: 50-74% accurate Divergent: Not tested Verbal Errors: Phonemic paraphasias (AWARE OF ERRORS APPROX 50% OF THE TIME) Pragmatics: No impairment Written Expression Written Expression: Not tested  Oral/Motor Oral Motor/Sensory Function Overall Oral  Motor/Sensory Function: Appears within functional limits for tasks assessed Velum: Within Functional Limits Mandible: Within Functional Limits Motor Speech Overall Motor Speech: Appears within functional limits for tasks assessed Respiration: Within functional limits Phonation: Normal Resonance: Within functional limits Intelligibility: Intelligibility reduced Word: 75-100% accurate Phrase: 75-100% accurate Sentence: 75-100% accurate Conversation: 50-74% accurate Motor Planning: Witnin functional limits  [employer] M.Ed [employer] [phone]  01/31/2011

## 2011-01-31 NOTE — Progress Notes (Signed)
UR Completed. Simmons, Rumaisa Schnetzer F [phone]  

## 2011-01-31 NOTE — Evaluation (Signed)
Occupational Therapy Evaluation Patient Details Name: Christopher Cook MRN: [id number] DOB: 03/02/1921 Today's Date: 01/31/2011 14:02-14:45  evII Problem List:  Patient Active Problem List  Diagnoses  . Parkinson disease  . Gout    Past Medical History:  Past Medical History  Diagnosis Date  . Lung cancer dx'd 2006    surg only  . Hypertension   . Stroke   . Parkinson disease   . Coronary artery disease   . Gout   . Dementia   . Angina   . Shortness of breath    Past Surgical History:  Past Surgical History  Procedure Date  . Lasik   . Coronary angioplasty with stent placement   . Lobectomy     OT Assessment/Plan/Recommendation OT Assessment Clinical Impression Statement: 78yr old with history of Parkinson's and CVA presents to [facility] hospital with new onset weakness and confusion.  Currently needs max assist for simulated selfcare tasks and ADLs.  Will benefit from acute OT services to help increase independence with these tasks as pt was at a supervison/ modified independent level prior to this admission.  Anticipate the need for SNF level rehab post acute to reach supervision/modified independence.    OT Recommendation/Assessment: Patient will need skilled OT in the acute care venue OT Problem List: Decreased strength;Decreased activity tolerance;Impaired balance (sitting and/or standing);Decreased knowledge of use of DME or AE;Decreased cognition;Decreased safety awareness Barriers to Discharge: Decreased caregiver support OT Therapy Diagnosis : Generalized weakness OT Plan OT Frequency: Min 2X/week OT Treatment/Interventions: Self-care/ADL training;Therapeutic activities;Cognitive remediation/compensation;DME and/or AE instruction;Balance training;Patient/family education OT Recommendation Follow Up Recommendations: Skilled nursing facility Equipment Recommended: Defer to next venue Individuals Consulted Consulted and Agree with Results and Recommendations:  Patient;Family member/caregiver Family Member Consulted: Pt's son OT Goals Acute Rehab OT Goals OT Goal Formulation: With patient Time For Goal Achievement: 2 weeks ADL Goals Pt Will Perform Grooming: with supervision;Standing at sink ADL Goal: Grooming - Progress: Goal set today Pt Will Perform Upper Body Bathing: with set-up;Unsupported;Sitting, edge of bed ADL Goal: Upper Body Bathing - Progress: Goal set today Pt Will Perform Lower Body Bathing: with supervision;Sit to stand from bed;Unsupported ADL Goal: Lower Body Bathing - Progress: Goal set today Pt Will Transfer to Toilet: with supervision;with DME;3-in-1;Comfort height toilet ADL Goal: Toilet Transfer - Progress: Goal set today Pt Will Perform Toileting - Clothing Manipulation: with supervision;Sitting on 3-in-1 or toilet;Standing ADL Goal: Toileting - Clothing Manipulation - Progress: Goal set today Pt Will Perform Toileting - Hygiene: with supervision;Sit to stand from 3-in-1/toilet ADL Goal: Toileting - Hygiene - Progress: Goal set today  OT Evaluation Precautions/Restrictions  Precautions Precautions: Fall Precaution Comments: History of Parkinson's disease Required Braces or Orthoses: No Restrictions Weight Bearing Restrictions: No Prior Functioning Home Living Lives With: Family Receives Help From: Family (can be up to 24 hour care) Type of Home: House Home Layout: Two level;Able to live on main level with bedroom/bathroom Home Access: Stairs to enter Entrance Stairs-Rails: Left;Right;Can reach both Entrance Stairs-Number of Steps: 5 Bathroom Shower/Tub: Engineer, manufacturing systems: Handicapped height Home Adaptive Equipment: Walker - rolling;Shower chair with back Prior Function Level of Independence: Independent with basic ADLs;Independent with transfers;Requires assistive device for independence;Independent with gait Driving: No Vocation: Retired ADL ADL Eating/Feeding:  Simulated;Supervision/safety Where Assessed - Eating/Feeding: Bed level Grooming: Simulated;Minimal assistance Grooming Details (indicate cue type and reason): Pt with increased lean to the right and posteriorly in sitting. Where Assessed - Grooming: Sitting, bed;Unsupported Upper Body Bathing: Simulated;Minimal assistance Upper Body Bathing Details (indicate  cue type and reason): Pt with increased lean to the right and posteriorly in sitting. Where Assessed - Upper Body Bathing: Sitting, bed;Unsupported Lower Body Bathing: Simulated;Maximal assistance Lower Body Bathing Details (indicate cue type and reason): Pt with posterior and right side lean in standing.  Unaware that he is falling. Where Assessed - Lower Body Bathing: Sit to stand from bed Upper Body Dressing: Simulated;Minimal assistance Where Assessed - Upper Body Dressing: Sitting, bed;Unsupported Lower Body Dressing: Simulated;Maximal assistance Where Assessed - Lower Body Dressing: Sit to stand from bed Toilet Transfer: Simulated;Maximal assistance Toilet Transfer Details (indicate cue type and reason): Pt with short shuffling steps.  Too fatigued to attempt ambulation to the toilet and BP dropping significantly in standing. Toilet Transfer Method: Surveyor, minerals: Other (comment) (Sit to stand from EOB.) Toileting - Clothing Manipulation: Simulated;Maximal assistance Where Assessed - Glass blower/designer Manipulation: Standing Toileting - Hygiene: Simulated;Maximal assistance Toileting - Hygiene Details (indicate cue type and reason): Sit to stand from EOB. Tub/Shower Transfer: Not assessed Tub/Shower Transfer Method: Not assessed Equipment Used: Rolling walker Ambulation Related to ADLs: Pt unable to ambulate this session secondary to fatigue and decreased blood pressure.  Noted the longer we stood to take BP the worse his standing balance became in sitting. ADL Comments: Pt with decreased ability to  consistently follow one and two step commands.  Able to follow approximately 75 % of the time.  noted pt incorrect with majority of questions answered with regards to how he performed tasks at home.  His son was present and able to give accurrate information.  Pt with significan balance impairment in standing.  Frequent LOB poseriorly in  standing without any awareness. Vision/Perception  Vision - History Baseline Vision: Wears glasses all the time Patient Visual Report: No change from baseline Vision - Assessment Vision Assessment: Vision tested Perception Perception: Impaired Spatial Orientation: Pt unaware of oreintation of his body in space.  Unable to initiate any self correction with balance. Praxis Praxis: Intact Cognition Cognition Arousal/Alertness: Awake/alert Overall Cognitive Status: Impaired History of Cognitive Impairment: Decline in baseline functioning Attention: Impaired Current Attention Level: Focused Attention - Other Comments: Pt able to maintain focused attention for approximately 1 to 2 mins. Memory: Appears impaired Memory Deficits: Decreased orientation to time and situation.  Unable to give accurate information regarding prior level of function.   Orientation Level: Oriented to person;Oriented to place;Disoriented to time;Disoriented to situation Following Commands: Follows one step commands inconsistently Awareness of Errors: Decreased awareness of errors made Decreased Awareness of Errors: Assistance required to identify errors made Awareness of Deficits: Decreased awareness of deficits Problem Solving: Requires assistance for problem solving Sensation/Coordination Sensation Light Touch: Appears Intact Stereognosis: Not tested Hot/Cold: Not tested Proprioception: Not tested Coordination Gross Motor Movements are Fluid and Coordinated: Yes (Overall movements are slower secondary to Parkinsons.) Fine Motor Movements are Fluid and Coordinated: Yes (Overall  movements are slower secondary to Parkinsons.) Extremity Assessment RUE Assessment RUE Assessment: Within Functional Limits (Grip strength 4/5 all other 5/5) LUE Assessment LUE Assessment: Within Functional Limits Mobility  Bed Mobility Bed Mobility: Yes Supine to Sit: HOB flat;With rails;4: Min assist Sitting - Scoot to Edge of Bed: 4: Min assist Transfers Transfers: Yes Sit to Stand: From bed;With upper extremity assist;3: Mod assist Sit to Stand Details (indicate cue type and reason): Mod assist for initial sit to stand.  Progressed to max assist for static sitting balance.  Max demonstrational cueing for hand placement to assist with sit to stand. Exercises  End of Session OT - End of Session Equipment Utilized During Treatment: Other (comment) Adult nurse) Activity Tolerance: Patient limited by fatigue Patient left: in bed;with call bell in reach;with bed alarm set;with family/visitor present Nurse Communication: Mobility status for transfers General Behavior During Session: Lethargic Cognition: Impaired Cognitive Impairment: Decreased awareness, attention, and memory compared to baseline.  Pt's son also reports it is more severe now than from previous CVA.   Gale Klar OTR/L 01/31/2011, 4:22 PM  Pager number [phone]

## 2011-01-31 NOTE — Progress Notes (Signed)
Pharmacy Lovenox Dose Adjustment  78 yo M presented to ED 1/28 with R sided weakness and difficulty with speech.  Currently undergoing workup for stroke.  Initial SCr 1.4, with CrCl ~ 37 has improved to SCr 1.1 and CrCl ~ 47.  Will increase Lovenox dosing to 40mg  SQ daily for VTE prophylaxis.  [employer], Pharm.D., BCPS Clinical Pharmacist Pager [phone]

## 2011-01-31 NOTE — Progress Notes (Signed)
Subjective:  Awake. Improving speech.  Objective:  Vital Signs in the last 24 hours: Temp:  [97.4 F (36.3 C)-98.5 F (36.9 C)] 97.8 F (36.6 C) (01/29 1734) Pulse Rate:  [59-74] 69  (01/29 1734) Cardiac Rhythm:  [-] Heart block (01/29 0830) Resp:  [17-20] 20  (01/29 1734) BP: (76-180)/(53-100) 152/66 mmHg (01/29 1734) SpO2:  [94 %-98 %] 95 % (01/29 1734)  Physical Exam: BP Readings from Last 1 Encounters:  01/31/11 152/66    Wt Readings from Last 1 Encounters:  01/30/11 72.576 kg (160 lb)    Weight change:   HEENT: [place]/AT, Eyes-Brown, Conjunctiva-Pink, Sclera-Non-icteric Neck: No JVD, No bruit, Trachea midline. Lungs:  Clear, Bilateral. Cardiac:  Regular rhythm, normal S1 and S2, no S3.  Abdomen:  Soft, non-tender. Extremities:  No edema present. No cyanosis. No clubbing. CNS: AxOx1, moves all 4 extremities. Right sided weakness. Skin: Warm and dry.   Intake/Output from previous day:      Lab Results: BMET    Component Value Date/Time   NA 142 01/31/2011 0610   NA 135 03/14/2010 1022   K 4.0 01/31/2011 0610   K 4.3 03/14/2010 1022   CL 105 01/31/2011 0610   CL 98 03/14/2010 1022   CO2 31 01/31/2011 0610   CO2 29 03/14/2010 1022   GLUCOSE 92 01/31/2011 0610   GLUCOSE 107 03/14/2010 1022   BUN 17 01/31/2011 0610   BUN 16 03/14/2010 1022   CREATININE 1.10 01/31/2011 0610   CREATININE 1.7* 03/14/2010 1022   CALCIUM 9.9 01/31/2011 0610   CALCIUM 9.5 03/14/2010 1022   GFRNONAA 58* 01/31/2011 0610   GFRAA 68* 01/31/2011 0610   CBC    Component Value Date/Time   WBC 2.3* 01/31/2011 0610   WBC 2.4* 12/22/2010 0919   RBC 4.59 01/31/2011 0610   RBC 4.50 12/22/2010 0919   HGB 12.8* 01/31/2011 0610   HGB 12.5* 12/22/2010 0919   HCT 40.3 01/31/2011 0610   HCT 39.3 12/22/2010 0919   PLT 138* 01/31/2011 0610   PLT 156 12/22/2010 0919   MCV 87.8 01/31/2011 0610   MCV 87.3 12/22/2010 0919   MCH 27.9 01/31/2011 0610   MCH 27.8 12/22/2010 0919   MCHC 31.8 01/31/2011 0610   MCHC 31.8*  12/22/2010 0919   RDW 15.4 01/31/2011 0610   RDW 15.7* 12/22/2010 0919   LYMPHSABS 0.9 01/30/2011 0935   LYMPHSABS 0.7* 12/22/2010 0919   MONOABS 0.5 01/30/2011 0935   MONOABS 0.4 12/22/2010 0919   EOSABS 0.3 01/30/2011 0935   EOSABS 0.3 12/22/2010 0919   BASOSABS 0.0 01/30/2011 0935   BASOSABS 0.0 12/22/2010 0919   CARDIAC ENZYMES Lab Results  Component Value Date   CKTOTAL 98 01/30/2011   CKMB 2.3 01/30/2011   TROPONINI <0.30 01/30/2011    Assessment/Plan:  Transient Ischemic attack MRI-neg for new stroke, + old strokes + Global atrophy/neurology Echo-Significant LVH, mild MR, TR. carotid doppler, PT/OT per neurology.  Hypertension  Home medications.  Parkinson;s disease  Coronary artery disease.     LOS: 1 day    Orpah Cobb  MD  01/31/2011, 9:09 PM

## 2011-01-31 NOTE — Plan of Care (Signed)
Problem: Phase II Progression Outcomes Goal: Able to communicate Speech-language-cognitive assessment completed.  See assessment for details.  Breck Coons Jardine.Ed [employer] [phone]  01/31/2011

## 2011-02-01 ENCOUNTER — Inpatient Hospital Stay (HOSPITAL_COMMUNITY): Payer: Medicare Other

## 2011-02-01 NOTE — Progress Notes (Signed)
Stroke Team Progress Note  HISTORY Christopher Cook is an 78 y.o. male with history of stroke about 2 years ago who was with family eating and had acute onset of difficulty with speech. Was noted to have right sided weakness as well. Patient was brought in as a code stroke. Weakness improved after presentation but he continued to have difficulty with speech. Patient had a stroke 2 years ago but was ambulatory at home and able to dress himself, ambulate independently and take care of his financial affairs with some assistance. Initial NIHSS of 10  SUBJECTIVE Son to meet with SW related to SNF placement. Overall he feels his condition is stable.  OBJECTIVE Most recent Vital Signs: Temp: 97.3 F (36.3 C) (01/30 0900) Temp src: Oral (01/30 0900) BP: 99/65 mmHg (01/30 0900) Pulse Rate: 73  (01/30 0900) Respiratory Rate: 18 O2 Saturation: 96%  CBG (last 3) No results found for this basename: GLUCAP:3 in the last 72 hours Intake/Output from previous day: 01/29 0701 - 01/30 0700 In: 790 [P.O.:240; I.V.:550] Out: 2550 [Urine:2550] IV Fluid Intake:     . sodium chloride 50 mL/hr at 01/31/11 1545   Medications    . allopurinol  300 mg Oral QPM  . amLODipine  5 mg Oral Daily  . aspirin  81 mg Oral Daily  . carbidopa-levodopa  1 tablet Oral TID  . clopidogrel  75 mg Oral QPM  . enoxaparin  40 mg Subcutaneous Q24H  . escitalopram  10 mg Oral Daily  . meclizine  25 mg Oral BID  . rosuvastatin  20 mg Oral q1800  . sodium chloride  250 mL Intravenous Once  . DISCONTD: aspirin  300 mg Rectal Daily  . DISCONTD: aspirin  325 mg Oral Daily  . DISCONTD: enoxaparin  30 mg Subcutaneous Q24H  PRN senna-docusate  Diet:  Cardiac thin liquids Activity:  Up with assistance DVT Prophylaxis:  Lovenox 30 mg sq daily   Significant Diagnostic Studies: CBC    Component Value Date/Time   WBC 2.3* 01/31/2011 0610   WBC 2.4* 12/22/2010 0919   RBC 4.59 01/31/2011 0610   RBC 4.50 12/22/2010 0919   HGB  12.8* 01/31/2011 0610   HGB 12.5* 12/22/2010 0919   HCT 40.3 01/31/2011 0610   HCT 39.3 12/22/2010 0919   PLT 138* 01/31/2011 0610   PLT 156 12/22/2010 0919   MCV 87.8 01/31/2011 0610   MCV 87.3 12/22/2010 0919   MCH 27.9 01/31/2011 0610   MCH 27.8 12/22/2010 0919   MCHC 31.8 01/31/2011 0610   MCHC 31.8* 12/22/2010 0919   RDW 15.4 01/31/2011 0610   RDW 15.7* 12/22/2010 0919   LYMPHSABS 0.9 01/30/2011 0935   LYMPHSABS 0.7* 12/22/2010 0919   MONOABS 0.5 01/30/2011 0935   MONOABS 0.4 12/22/2010 0919   EOSABS 0.3 01/30/2011 0935   EOSABS 0.3 12/22/2010 0919   BASOSABS 0.0 01/30/2011 0935   BASOSABS 0.0 12/22/2010 0919   CMP    Component Value Date/Time   NA 142 01/31/2011 0610   NA 135 03/14/2010 1022   K 4.0 01/31/2011 0610   K 4.3 03/14/2010 1022   CL 105 01/31/2011 0610   CL 98 03/14/2010 1022   CO2 31 01/31/2011 0610   CO2 29 03/14/2010 1022   GLUCOSE 92 01/31/2011 0610   GLUCOSE 107 03/14/2010 1022   BUN 17 01/31/2011 0610   BUN 16 03/14/2010 1022   CREATININE 1.10 01/31/2011 0610   CREATININE 1.7* 03/14/2010 1022   CALCIUM 9.9 01/31/2011 0610  CALCIUM 9.5 03/14/2010 1022   PROT 7.4 01/30/2011 0935   PROT 7.5 03/14/2010 1022   ALBUMIN 3.4* 01/30/2011 0935   AST 17 01/30/2011 0935   AST 23 03/14/2010 1022   ALT 8 01/30/2011 0935   ALKPHOS 55 01/30/2011 0935   ALKPHOS 46 03/14/2010 1022   BILITOT 0.5 01/30/2011 0935   BILITOT 0.70 03/14/2010 1022   GFRNONAA 58* 01/31/2011 0610   GFRAA 68* 01/31/2011 0610   COAGS Lab Results  Component Value Date   INR 1.03 01/30/2011   INR 1.01 04/06/2010   INR 1.02 11/05/2008   Lipid Panel    Component Value Date/Time   CHOL 141 01/31/2011 0610   TRIG 97 01/31/2011 0610   HDL 46 01/31/2011 0610   CHOLHDL 3.1 01/31/2011 0610   VLDL 19 01/31/2011 0610   LDLCALC 76 01/31/2011 0610   HgbA1C  Lab Results  Component Value Date   HGBA1C 6.1* 01/31/2011   Urine Drug Screen  No results found for this basename: labopia,  cocainscrnur,  labbenz,  amphetmu,  thcu,   labbarb    Alcohol Level No results found for this basename: eth   CT of the brain  1. Subtle increased density in the left middle cerebral artery as described which may represent acute thrombosis. 2.  Multiple old strokes.    MRI of the brain   No acute infarct.  Remote infarcts and small vessel disease type changes.  Global atrophy.  Ventricular prominence may be related to atrophy although difficult to completely exclude a mild component hydrocephalus.  Paranasal sinus mucosal thickening most notable involving the right maxillary sinus.  MRA of the brain   Minimal progression of atherosclerotic type changes   2D Echocardiogram  Performed, results pending  Carotid Doppler  No internal carotid artery stenosis bilaterally. Vertebrals with antegrade flow bilaterally.   CXR  Stable postoperative changes on the left.  Right mid lung soft tissue density.  On prior chest CT, there appears to be treatment changes within this portion of the right lung and I suspect this represents scarring.  This is unchanged since prior chest CT.    EKG  unchanged from previous tracings, normal sinus rhythm, RBBB, bifascicular block, PAC's noted, 1st degree AV block, left anterior fascicular block.  EEG ordered   Physical Exam   Pleasant elderly African American gentleman who is not in distress. Afebrile head is nontraumatic. Neck is supple without bruit. Cardiac exam no murmur or gallop. Lungs clear to auscultation. Distal pulses well felt.  Neurological exam awake alert oriented to place and person but not to time. Decreased attention, short-term memory and recall. Follows simple one and two-step commands. Speech is slightly pressured but no dysarthria or aphasia. Extraocular movements are full range without nystagmus. Blink to threat bilaterally. Motor system exam reveals symmetric upper and lower extremity strength. Sensation, coordination and gait was not tested.  ASSESSMENT [name] is a 78 y.o.  male with a seizure, postictal today confusion improved. Ambulation back to baseline. MRI negative for stroke. On Keppra for seizure. ASA 81 and Plavix 75mg  prior to admission. ASA increased to 325 in hospital. EEG ordered; not yet done. Son interested in [facility] placement  Hospital day # 2  TREATMENT/PLAN Continue  aspirin 81 mg orally every day and clopidogrel 75 mg orally every day for secondary stroke prevention. EEG pending. Will sign off. Follow up with Dr. Pearlean Brownie in 2 months.   SHARON BIBY, AVNP, ANP-BC, GNP-BC [facility] Pager: [phone]  02/01/2011 10:34 AM  Dr. Delia Heady, Stroke Center Medical Director, has personally reviewed chart, pertinent data, examined the patient and developed the plan of care.

## 2011-02-01 NOTE — Progress Notes (Signed)
   CARE MANAGEMENT NOTE 02/01/2011  Patient:  Christopher Cook, Christopher Cook   Account Number:  [id number]  Date Initiated:  02/01/2011  Documentation initiated by:  GRAVES-BIGELOW,Crytal Pensinger  Subjective/Objective Assessment:   Pt admittted with Left brain stroke. Has family support. CSW has been speaking to son about d/c disposition and PT has been following  with recommendations of SNF.     Action/Plan:   Anticipated DC Date:  02/06/2011   Anticipated DC Plan:  SKILLED NURSING FACILITY  In-house referral  Clinical Social Worker      DC Planning Services  CM consult      Choice offered to / List presented to:             Status of service:  In process, will continue to follow Medicare Important Message given?   (If response is "NO", the following Medicare IM given date fields will be blank) Date Medicare IM given:   Date Additional Medicare IM given:    Discharge Disposition:    Per UR Regulation:    Comments:  02-02-11 1548 Tomi Bamberger, RN,BSN [phone] CM will continue to monitor for d/c disposition.

## 2011-02-01 NOTE — Progress Notes (Signed)
Echocardiogram 2D Echocardiogram has been performed.  Christopher Cook 01/31/11

## 2011-02-01 NOTE — Progress Notes (Signed)
Physical Therapy Treatment Patient Details Name: Christopher Cook MRN: [id number] DOB: Jan 18, 1921 Today's Date: 02/01/2011  PT Assessment/Plan  PT - Assessment/Plan Comments on Treatment Session: Pt able to improve overall mobility with less assistance needed especially with ambulation.  RN notified about bleeding area on abdomen covered in gauze due to lovenox shot per RN. PT Plan: Discharge plan remains appropriate;Frequency remains appropriate PT Frequency: Min 3X/week Follow Up Recommendations: Skilled nursing facility Equipment Recommended: Defer to next venue PT Goals  Acute Rehab PT Goals PT Goal Formulation: With patient/family Time For Goal Achievement: 2 weeks Pt will go Supine/Side to Sit: with supervision PT Goal: Supine/Side to Sit - Progress: Progressing toward goal Pt will Sit at [facility] of Bed: with supervision;3-5 min;with no upper extremity support PT Goal: Sit at Edge Of Bed - Progress: Progressing toward goal Pt will go Sit to Stand: with supervision;with upper extremity assist PT Goal: Sit to Stand - Progress: Progressing toward goal Pt will go Stand to Sit: with supervision PT Goal: Stand to Sit - Progress: Progressing toward goal Pt will Stand: with bilateral upper extremity support;with supervision PT Goal: Stand - Progress: Progressing toward goal Pt will Ambulate: >150 feet;with supervision;with least restrictive assistive device PT Goal: Ambulate - Progress: Progressing toward goal  PT Treatment Precautions/Restrictions  Precautions Precautions: Fall Precaution Comments: History of Parkinson's disease Required Braces or Orthoses: No Restrictions Weight Bearing Restrictions: No Mobility (including Balance) Bed Mobility Bed Mobility: Yes Supine to Sit: HOB flat;With rails;4: Min assist Supine to Sit Details (indicate cue type and reason): (A) to elevate trunk OOB with cues for hand and LE placement to complete upright posture.  Pt needs extra time to  complete mobility. Sitting - Scoot to Edge of Bed: 4: Min assist Sitting - Scoot to [place] of Bed Details (indicate cue type and reason): Cues for technique to advance hips to EOB. Transfers Transfers: Yes Sit to Stand: From bed;From chair/3-in-1;3: Mod assist Sit to Stand Details (indicate cue type and reason): Initially able to performe with min (A) but after 2-3 sit -> stand pt with increase fatigue and needed mod (A) to complete transfer.  VCs for hand placement as pt tends to keep hands on RW with transfer. Stand to Sit: 4: Min assist;To chair/3-in-1;To bed Stand to Sit Details: (A) to slowly descend to recliner with cues for hand placement and extra time needed to complete transfer. Ambulation/Gait Ambulation/Gait: Yes Ambulation/Gait Assistance: 4: Min assist Ambulation/Gait Assistance Details (indicate cue type and reason): Occasional min (A) to maintain balance especially with turns.  VCs and visual cues to increase step width. Ambulation Distance (Feet): 150 Feet Assistive device: Rolling walker Gait Pattern: Step-through pattern;Decreased stride length  Balance Balance Assessed: Yes Static Sitting Balance Static Sitting - Balance Support: Bilateral upper extremity supported;Feet supported Static Sitting - Level of Assistance: 5: Stand by assistance Static Sitting - Comment/# of Minutes: secondary to retropulsion - increases with increased time in sitting Static Standing Balance Static Standing - Balance Support: Left upper extremity supported Static Standing - Level of Assistance: 3: Mod assist Static Standing - Comment/# of Minutes: ~ 30 seconds x 5 during with (A) to maintain balance due to posterior lean Exercise    End of Session PT - End of Session Equipment Utilized During Treatment: Gait belt Activity Tolerance: Patient tolerated treatment well Patient left: in chair;with call bell in reach Nurse Communication: Mobility status for transfers;Mobility status for  ambulation General Behavior During Session: [facility] for tasks performed Cognition: Impaired Cognitive Impairment: Slow  to process with selective attention.  Christopher Cook 02/01/2011, 2:59 PM [phone]

## 2011-02-01 NOTE — Progress Notes (Signed)
Subjective:  Awake. Tolerated feedings and sitting up.  Objective:  Vital Signs in the last 24 hours: Temp:  [97.2 F (36.2 C)-98.5 F (36.9 C)] 97.2 F (36.2 C) (01/30 0456) Pulse Rate:  [61-76] 61  (01/30 0456) Cardiac Rhythm:  [-] Heart block (01/30 0800) Resp:  [16-20] 16  (01/30 0456) BP: (76-191)/(53-100) 152/78 mmHg (01/30 0456) SpO2:  [94 %-98 %] 98 % (01/30 0456)  Physical Exam: BP Readings from Last 1 Encounters:  02/01/11 152/78    Wt Readings from Last 1 Encounters:  01/30/11 72.576 kg (160 lb)    Weight change:   HEENT: [place]/AT, Eyes-Brown, Conjunctiva-Pink, Sclera-Non-icteric Neck: No JVD, No bruit, Trachea midline. Lungs:  Clear, Bilateral. Cardiac:  Regular rhythm, normal S1 and S2, no S3.  Abdomen:  Soft, non-tender. Extremities:  No edema present. No cyanosis. No clubbing. CNS: AxOx3, Cranial nerves grossly intact, moves all 4 extremities. Right sided weakness. Skin: Warm and dry.   Intake/Output from previous day: 01/29 0701 - 01/30 0700 In: 790 [P.O.:240; I.V.:550] Out: 2550 [Urine:2550]    Lab Results: BMET    Component Value Date/Time   NA 142 01/31/2011 0610   NA 135 03/14/2010 1022   K 4.0 01/31/2011 0610   K 4.3 03/14/2010 1022   CL 105 01/31/2011 0610   CL 98 03/14/2010 1022   CO2 31 01/31/2011 0610   CO2 29 03/14/2010 1022   GLUCOSE 92 01/31/2011 0610   GLUCOSE 107 03/14/2010 1022   BUN 17 01/31/2011 0610   BUN 16 03/14/2010 1022   CREATININE 1.10 01/31/2011 0610   CREATININE 1.7* 03/14/2010 1022   CALCIUM 9.9 01/31/2011 0610   CALCIUM 9.5 03/14/2010 1022   GFRNONAA 58* 01/31/2011 0610   GFRAA 68* 01/31/2011 0610   CBC    Component Value Date/Time   WBC 2.3* 01/31/2011 0610   WBC 2.4* 12/22/2010 0919   RBC 4.59 01/31/2011 0610   RBC 4.50 12/22/2010 0919   HGB 12.8* 01/31/2011 0610   HGB 12.5* 12/22/2010 0919   HCT 40.3 01/31/2011 0610   HCT 39.3 12/22/2010 0919   PLT 138* 01/31/2011 0610   PLT 156 12/22/2010 0919   MCV 87.8 01/31/2011 0610   MCV 87.3 12/22/2010 0919   MCH 27.9 01/31/2011 0610   MCH 27.8 12/22/2010 0919   MCHC 31.8 01/31/2011 0610   MCHC 31.8* 12/22/2010 0919   RDW 15.4 01/31/2011 0610   RDW 15.7* 12/22/2010 0919   LYMPHSABS 0.9 01/30/2011 0935   LYMPHSABS 0.7* 12/22/2010 0919   MONOABS 0.5 01/30/2011 0935   MONOABS 0.4 12/22/2010 0919   EOSABS 0.3 01/30/2011 0935   EOSABS 0.3 12/22/2010 0919   BASOSABS 0.0 01/30/2011 0935   BASOSABS 0.0 12/22/2010 0919   CARDIAC ENZYMES Lab Results  Component Value Date   CKTOTAL 98 01/30/2011   CKMB 2.3 01/30/2011   TROPONINI <0.30 01/30/2011    Assessment/Plan:  Transient Ischemic attack  MRI-neg for new stroke, + old strokes + Global atrophy/neurology  Echo-Significant LVH, mild MR, TR.  Appreciate neurology help.  Hypertension  Home medications.  Parkinson;s disease  Coronary artery disease.   Possible SNF v/s Rehab   LOS: 2 days    Orpah Cobb  MD  02/01/2011, 9:34 AM

## 2011-02-01 NOTE — Progress Notes (Signed)
Patient given Lovenox subcutaneously at around 2200. Patient abdominal site (Left Upper Quadrant) from the lovenox shot was still bleeding. Called Dr. Algie Coffer and ordered to add pressure dressing on patient's abdomen. Will continue to monitor patient closely.

## 2011-02-01 NOTE — Progress Notes (Signed)
Clinical Social Worker met with patient to discuss discharge planning of possible short term rehab. CSW completed psychosocial assessment, which can be found in the shadow chart. CSW spoke with son who wants to meet tomorrow at 10:30am to discuss further the snf option. CSW will continue to follow.   Rozetta Nunnery MSW, [employer] [phone]

## 2011-02-02 NOTE — Procedures (Signed)
EEG NUMBER:  [id number].  HISTORY:  An 78 years old man referred for rule out seizures.  MEDICATIONS:  No anticonvulsive medications, per report.  CONDITION OF RECORDING:  This 16-lead EEG was recorded on this patient in awake and drowsy states.  Background rhythm: background patterns in wakefulness were well-organized with a well sustained posterior dominant rhythm of 10.5 Hz, symmetrical and reactive to eye opening and closing. Drowsiness was associated with mild attenuation of voltage and slowing frequencies.  Abnormal potentials: no epileptiform activity or focal slowing was noted.  ACTIVATION PROCEDURES:  Hyperventilation was not performed.  Photic stimulation was not performed.  EKG:  Single-channel of EKG monitoring detected an irregular rhythm.  IMPRESSION:  This is an abnormal awake and drowsy EEG due to presence of mild diffuse background slowing. The findings may be suggestive of mild  diffuse cerebral dysfunction.  It may be due to toxic metabolic encephalopathy, neurodegenerative disorder, and/or hemispheric structural abnormality.  Clinical correlation suggested.          ______________________________ Carmell Austria, MD    [id number] D:  02/01/2011 15:11:33  T:  02/02/2011 04:45:26  Job #:  [id number]

## 2011-02-02 NOTE — Progress Notes (Addendum)
Speech Language/Pathology  Speech Pathology:  Treatment Note  Subjective:  Pt. Sleeping when SLP arrived  Objective: Today's treatment session focused on pt.'s communicative-cognitive abilities during a functional activity (eating breakfast).  Pt. Required set up assist to open packages but able to self feed without assistance.  He followed 2 step directives utilizing food/utensils/items on tray with moderate verbal/visual/tactile cues.  Expression of thoughts during session contained hesitations, pauses, dysnomia, decreased semantics.  Pt. exhibited some degree of emergent awareness of difficulty by stating "You have to forgive me, I'm just out of sorts".      Assessment:  Increased evidence of dementia like communication difficulty exhibited during today's session versus sole aphasia which is complicated by pt.'s history of remote left CVA's.  Family not present to provide baseline linguistic-cognitive ability and judge pt.'s currently function to baseline.   Recommendations:  Continue treatment while in acute care and recommend SLP at SNF for education for family regarding communication/cognition.   Pain:   none Intervention Required:   No   Goals: No Goals Met    Breck Coons Penermon M.Ed [employer] [phone]  02/02/2011

## 2011-02-02 NOTE — Progress Notes (Signed)
Subjective:  Feeling better. Hoping to go home. Feeding by self.  Objective:  Vital Signs in the last 24 hours: Temp:  [97.5 F (36.4 C)-98.6 F (37 C)] 98.1 F (36.7 C) (01/31 1300) Pulse Rate:  [48-63] 63  (01/31 1300) Cardiac Rhythm:  [-] Heart block (01/31 0746) Resp:  [17-18] 18  (01/31 1300) BP: (104-160)/(64-76) 104/68 mmHg (01/31 1300) SpO2:  [94 %-97 %] 97 % (01/31 1300)  Physical Exam: BP Readings from Last 1 Encounters:  02/02/11 104/68    Wt Readings from Last 1 Encounters:  01/30/11 72.576 kg (160 lb)    Weight change:   HEENT: [place]/AT, Eyes-Brown, PERL, EOMI, Conjunctiva-Pink, Sclera-Non-icteric Neck: No JVD, No bruit, Trachea midline. Lungs:  Clear, Bilateral. Cardiac:  Regular rhythm, normal S1 and S2, no S3.  Abdomen:  Soft, non-tender. Extremities:  No edema present. No cyanosis. No clubbing. CNS: AxOx3, Cranial nerves grossly intact, moves all 4 extremities. Right sided weakness. Skin: Warm and dry.   Intake/Output from previous day: 01/30 0701 - 01/31 0700 In: 440 [P.O.:440] Out: 1300 [Urine:1300]    Lab Results: BMET    Component Value Date/Time   NA 142 01/31/2011 0610   NA 135 03/14/2010 1022   K 4.0 01/31/2011 0610   K 4.3 03/14/2010 1022   CL 105 01/31/2011 0610   CL 98 03/14/2010 1022   CO2 31 01/31/2011 0610   CO2 29 03/14/2010 1022   GLUCOSE 92 01/31/2011 0610   GLUCOSE 107 03/14/2010 1022   BUN 17 01/31/2011 0610   BUN 16 03/14/2010 1022   CREATININE 1.10 01/31/2011 0610   CREATININE 1.7* 03/14/2010 1022   CALCIUM 9.9 01/31/2011 0610   CALCIUM 9.5 03/14/2010 1022   GFRNONAA 58* 01/31/2011 0610   GFRAA 68* 01/31/2011 0610   CBC    Component Value Date/Time   WBC 2.3* 01/31/2011 0610   WBC 2.4* 12/22/2010 0919   RBC 4.59 01/31/2011 0610   RBC 4.50 12/22/2010 0919   HGB 12.8* 01/31/2011 0610   HGB 12.5* 12/22/2010 0919   HCT 40.3 01/31/2011 0610   HCT 39.3 12/22/2010 0919   PLT 138* 01/31/2011 0610   PLT 156 12/22/2010 0919   MCV 87.8  01/31/2011 0610   MCV 87.3 12/22/2010 0919   MCH 27.9 01/31/2011 0610   MCH 27.8 12/22/2010 0919   MCHC 31.8 01/31/2011 0610   MCHC 31.8* 12/22/2010 0919   RDW 15.4 01/31/2011 0610   RDW 15.7* 12/22/2010 0919   LYMPHSABS 0.9 01/30/2011 0935   LYMPHSABS 0.7* 12/22/2010 0919   MONOABS 0.5 01/30/2011 0935   MONOABS 0.4 12/22/2010 0919   EOSABS 0.3 01/30/2011 0935   EOSABS 0.3 12/22/2010 0919   BASOSABS 0.0 01/30/2011 0935   BASOSABS 0.0 12/22/2010 0919   CARDIAC ENZYMES Lab Results  Component Value Date   CKTOTAL 98 01/30/2011   CKMB 2.3 01/30/2011   TROPONINI <0.30 01/30/2011    Assessment/Plan: Transient Ischemic attack  MRI-neg for new stroke, + old strokes + Global atrophy/neurology  Echo-Significant LVH, mild MR, TR.  Appreciate neurology help. Possible seizures/Neurology  Hypertension  Home medications.  Parkinson;s disease  Coronary artery disease.   Possible SNF v/s Rehab v/s home    LOS: 3 days    Orpah Cobb  MD  02/02/2011, 5:29 PM

## 2011-02-02 NOTE — Progress Notes (Addendum)
Clinical Social Worker met with patient's son, Winford, to discuss discharge planning for SNF. Son stated that he has healthcare power of attorney. Patient lives with son and his wife and they have been providing 24 hr care for him. Son stated that his preferences are [facility], [facility], and [facility] for SNF choices. CSW will complete FL2, Placement note and fax out to these facilities once FL2 is completed. CSW will continue to follow and update family with bed offers when received.   Rozetta Nunnery MSW, [employer] [phone]

## 2011-02-03 MED ORDER — SIMVASTATIN 80 MG PO TABS: 40.0000 mg | ORAL_TABLET | Freq: Every day | ORAL | Status: DC

## 2011-02-03 NOTE — Progress Notes (Signed)
Clinical Social Worker met with patient's family to discuss bed offers received. Patient and family are agreeable to [facility] for short term rehab. Facility is able to receive patient today as they had one male bed available today. Family prefer EMS transportation from hospital to facility. CSW will facilitate discharge today once the discharge summary is completed.   Rozetta Nunnery MSW, [employer] [phone]

## 2011-02-03 NOTE — Progress Notes (Signed)
Physical Therapy Treatment Patient Details Name: Christopher Cook MRN: [id number] DOB: 07-19-1921 Today's Date: 02/03/2011  PT Assessment/Plan  PT - Assessment/Plan Comments on Treatment Session: Pt progressing with overall mobility today.  PT Plan: Discharge plan remains appropriate PT Frequency: Min 3X/week Follow Up Recommendations: Skilled nursing facility Equipment Recommended: Defer to next venue PT Goals  Acute Rehab PT Goals PT Goal: Sit to Stand - Progress: Progressing toward goal PT Goal: Stand to Sit - Progress: Progressing toward goal PT Goal: Stand - Progress: Progressing toward goal PT Goal: Ambulate - Progress: Progressing toward goal  PT Treatment Precautions/Restrictions  Precautions Precautions: Fall Precaution Comments: History of Parkinson's disease Required Braces or Orthoses: No Restrictions Weight Bearing Restrictions: No Mobility (including Balance) Bed Mobility Bed Mobility: No Transfers Sit to Stand: 4: Min assist;From chair/3-in-1;With upper extremity assist;With armrests Sit to Stand Details (indicate cue type and reason): x3. Cues for safe positioning and safe technique. A to shift weight anteriorly over BOS.  Stand to Sit: 4: Min assist;To chair/3-in-1;With armrests;With upper extremity assist Stand to Sit Details: A to control descent into recliner and cues for safe hand placement Ambulation/Gait Ambulation/Gait: Yes Ambulation/Gait Assistance: 4: Min assist Ambulation/Gait Assistance Details (indicate cue type and reason): Min A for balance. Patient also needed several cues for safe management of RW as he was unable to [facility] around objects with A Ambulation Distance (Feet): 240 Feet Assistive device: Rolling walker Gait Pattern: Step-through pattern;Trunk flexed Gait velocity: decreased  Static Standing Balance Static Standing - Balance Support: No upper extremity supported Static Standing - Level of Assistance: 5: Stand by assistance Static  Standing - Comment/# of Minutes: Patient stood ~5 mins while washing hands and face. No eignificant posterior lean noted today Exercise    End of Session PT - End of Session Equipment Utilized During Treatment: Gait belt Activity Tolerance: Patient tolerated treatment well Patient left: in chair General Behavior During Session: [facility] for tasks performed Cognition: Impaired Cognitive Impairment: Slow to process information and difficulty following one step commands without significant time to complete  Fredrich Birks 02/03/2011, 2:01 PM 02/03/2011 Fredrich Birks PTA [phone] pager [phone] office

## 2011-02-03 NOTE — Progress Notes (Signed)
OT Cancellation Note   Treatment cancelled today due to transport arrival to take pt to SNF.  02/03/2011 Cipriano Mile OTR/L Pager [phone] Office [phone]

## 2011-02-03 NOTE — Discharge Summary (Signed)
Physician Discharge Summary  Patient ID: Christopher Cook MRN: [id number] DOB/AGE: [birth year] 78 y.o.  Admit date: 01/30/2011 Discharge date: 02/03/2011  Admission Diagnoses: Acute cerebrovascular accident  Hypertension  Parkinson;s disease  Coronary artery disease.  Discharge Diagnoses:  Transient Ischemic attack-Principle diagnosis  Possible seizures  Hypertension   Parkinson;s disease  Coronary artery disease.   Discharged Condition: fair  Hospital Course: 78 years old black male presented with right sided weakness and starring spell at home. TPA was refused by patient. Subsequently on MRI of brain patient no new stroke. Patient recovered well for his age and baseline dementia. He was transferred to area nursing home for PT/OT and rehab. His simvastatin dose was reduced due to concomitant use of amlodipine. He will be followed by his primary care in 1 month.  Consults: neurology  Significant Diagnostic Studies: labs: Normal Hgb, Hct, electrolytes, Borderline BUN/Cr elevation, normal CK, MB and Troponin-I.  CT and MRI brain negative for acute infarct. Positive for cerebral atrophy.  Treatments: therapies: PT and OT  Discharge Exam: Blood pressure 143/81, pulse 67, temperature 97.5 F (36.4 C), temperature source Oral, resp. rate 18, height 5\' 9"  (1.753 m), weight 72.576 kg (160 lb), SpO2 98.00%. General appearance: alert, cooperative and appears stated age  Head: Normocephalic, without obvious abnormality, atraumatic  Eyes: conjunctivae/corneas clear. PERRL, EOM's intact. Bilateral lens implants  Throat: lips, mucosa, and tongue normal; teeth and gums normal  Neck: no adenopathy, no carotid bruit, no JVD, supple, symmetrical, trachea midline and thyroid not enlarged, symmetric, no tenderness/mass/nodules  Resp: clear to auscultation bilaterally  Cardio: regular rate and rhythm, S1, S2 normal, II/VI systolic murmur.  GI: soft, non-tender; bowel sounds normal; no masses, no  organomegaly  Extremities: extremities normal, atraumatic, no cyanosis or edema  Skin: Skin color, texture, turgor normal.  Neurologic: Alert and oriented X 1. Right sided weakness.   Disposition: Nursing Home  Discharge Orders    Future Appointments: Provider: Department: Dept Phone: Center:   06/28/2011 9:15 AM Stephanie Acre [facility] [phone] None   06/28/2011 10:00 AM [facility] [phone] [place]   06/29/2011 11:00 AM Mohamed K. Arbutus Ped, MD [facility] [phone] None   07/13/2011 10:00 AM Lurline Hare, MD [facility] [phone] None     Medication List  As of 02/03/2011 12:33 PM   TAKE these medications         allopurinol 300 MG tablet   Commonly known as: ZYLOPRIM   Take 300 mg by mouth every evening.      aspirin 81 MG tablet   Take 81 mg by mouth daily.      carbidopa-levodopa 25-100 MG per tablet   Commonly known as: SINEMET   Take 1 tablet by mouth 3 (three) times daily.      clopidogrel 75 MG tablet   Commonly known as: PLAVIX   Take 75 mg by mouth every evening.      escitalopram 20 MG tablet   Commonly known as: LEXAPRO   Take 10 mg by mouth daily.      meclizine 25 MG tablet   Commonly known as: ANTIVERT   Take 25 mg by mouth 2 (two) times daily.      multivitamin tablet   Take 1 tablet by mouth daily.      simvastatin 80 MG tablet   Commonly known as: ZOCOR   Take 0.5 tablets (40 mg total) by mouth at bedtime.           Follow-up Information  Follow up with Gates Rigg, MD in 2 months.   Contact information:   [address] [facility] [place] [address] [phone]          Signed: Ricki Rodriguez 02/03/2011, 12:33 PM

## 2011-02-19 ENCOUNTER — Emergency Department (HOSPITAL_COMMUNITY): Payer: Medicare Other

## 2011-02-19 ENCOUNTER — Encounter (HOSPITAL_COMMUNITY): Payer: Self-pay

## 2011-02-19 ENCOUNTER — Other Ambulatory Visit: Payer: Self-pay

## 2011-02-19 ENCOUNTER — Emergency Department (HOSPITAL_COMMUNITY)
Admission: EM | Admit: 2011-02-19 | Discharge: 2011-02-20 | Disposition: A | Discharge status: Home / Self Care | Payer: Medicare Other | Attending: Emergency Medicine | Admitting: Emergency Medicine

## 2011-02-19 DIAGNOSIS — Z85118 Personal history of other malignant neoplasm of bronchus and lung: Secondary | ICD-10-CM | POA: Insufficient documentation

## 2011-02-19 DIAGNOSIS — G319 Degenerative disease of nervous system, unspecified: Secondary | ICD-10-CM | POA: Insufficient documentation

## 2011-02-19 DIAGNOSIS — G20A1 Parkinson's disease without dyskinesia, without mention of fluctuations: Secondary | ICD-10-CM | POA: Insufficient documentation

## 2011-02-19 DIAGNOSIS — R6889 Other general symptoms and signs: Secondary | ICD-10-CM | POA: Insufficient documentation

## 2011-02-19 DIAGNOSIS — T1490XA Injury, unspecified, initial encounter: Secondary | ICD-10-CM | POA: Insufficient documentation

## 2011-02-19 DIAGNOSIS — I2510 Atherosclerotic heart disease of native coronary artery without angina pectoris: Secondary | ICD-10-CM | POA: Insufficient documentation

## 2011-02-19 DIAGNOSIS — W19XXXA Unspecified fall, initial encounter: Secondary | ICD-10-CM | POA: Insufficient documentation

## 2011-02-19 DIAGNOSIS — G20 Parkinson's disease: Secondary | ICD-10-CM | POA: Insufficient documentation

## 2011-02-19 DIAGNOSIS — J320 Chronic maxillary sinusitis: Secondary | ICD-10-CM | POA: Insufficient documentation

## 2011-02-19 DIAGNOSIS — J323 Chronic sphenoidal sinusitis: Secondary | ICD-10-CM | POA: Insufficient documentation

## 2011-02-19 DIAGNOSIS — I10 Essential (primary) hypertension: Secondary | ICD-10-CM | POA: Insufficient documentation

## 2011-02-19 DIAGNOSIS — Z7901 Long term (current) use of anticoagulants: Secondary | ICD-10-CM | POA: Insufficient documentation

## 2011-02-19 DIAGNOSIS — I6990 Unspecified sequelae of unspecified cerebrovascular disease: Secondary | ICD-10-CM | POA: Insufficient documentation

## 2011-02-19 HISTORY — DX: Transient cerebral ischemic attack, unspecified: G45.9

## 2011-02-19 LAB — DIFFERENTIAL
Basophils Relative: 1 % (ref 0–1)
Monocytes Relative: 13 % — ABNORMAL HIGH (ref 3–12)
Neutro Abs: 2.3 10*3/uL (ref 1.7–7.7)
Neutrophils Relative %: 60 % (ref 43–77)

## 2011-02-19 LAB — URINALYSIS, ROUTINE W REFLEX MICROSCOPIC
Bilirubin Urine: NEGATIVE
Leukocytes, UA: NEGATIVE
Nitrite: NEGATIVE
Specific Gravity, Urine: 1.02 (ref 1.005–1.030)
Urobilinogen, UA: 0.2 mg/dL (ref 0.0–1.0)
pH: 6 (ref 5.0–8.0)

## 2011-02-19 LAB — CBC
Hemoglobin: 12.3 g/dL — ABNORMAL LOW (ref 13.0–17.0)
MCHC: 32 g/dL (ref 30.0–36.0)
Platelets: 190 10*3/uL (ref 150–400)
RBC: 4.37 MIL/uL (ref 4.22–5.81)

## 2011-02-19 LAB — BASIC METABOLIC PANEL
BUN: 25 mg/dL — ABNORMAL HIGH (ref 6–23)
Chloride: 101 mEq/L (ref 96–112)
GFR calc Af Amer: 54 mL/min — ABNORMAL LOW (ref >90)
Potassium: 4.3 mEq/L (ref 3.5–5.1)
Sodium: 138 mEq/L (ref 135–145)

## 2011-02-19 NOTE — ED Notes (Signed)
UJW:JXBJY<NW> Expected date:02/19/11<BR> Expected time: 2:19 PM<BR> Means of arrival:Ambulance<BR> Comments:<BR> M12. Fall from facility no c/c, no injuries, 10-15

## 2011-02-19 NOTE — ED Notes (Signed)
Assisted to rest room to void, gait slow, pt became unsteady on his feet and stumbled when turning around, assisted back to stretcher in hallway, side rails up x 2, family at bedside.

## 2011-02-19 NOTE — Discharge Instructions (Signed)
Please read and follow instructions below.  The CT scan of your head is not changed. Your blood and urine tests are not concerning.   You may return home. See your doctor for a follow-up this week.  Please return with worsening symptoms or if you have any other concerns.  Vital signs today: BP 164/78  Pulse 73  Temp(Src) 98.5 F (36.9 C) (Oral)  Resp 18  SpO2 97%

## 2011-02-19 NOTE — ED Provider Notes (Signed)
History     CSN: [id number]  Arrival date & time 02/19/11  1438   First MD Initiated Contact with Patient 02/19/11 1555      Chief Complaint  Patient presents with  . Fall    (Consider location/radiation/quality/duration/timing/severity/associated sxs/prior treatment) HPI Comments: Patient presents from rehabilitation facility after having a mechanical fall from standing. Patient unclear as to event and denies head injury. Per staff it is thought he was pushing a chair and fell. Patient is currently getting rehabilitation from her previous stroke. Patient has a history of Parkinson's disease and is taking medication for this. He has a history of dizziness and takes meclizine. Patient is on Plavix. Patient denies any current injury. He was sent for evaluation. No treatments prior to arrival.  Patient is a 78 y.o. male presenting with fall. The history is provided by the patient and a relative.  Fall The accident occurred 1 to 2 hours ago. The fall occurred while walking. He landed on a hard floor. There was no blood loss. Point of impact: unknown. Pain location: none. Pertinent negatives include no visual change, no numbness, no bowel incontinence, no nausea, no vomiting, no headaches and no tingling.    Past Medical History  Diagnosis Date  . Lung cancer dx'd 2006    surg only  . Hypertension   . Stroke   . Parkinson disease   . Coronary artery disease   . Gout   . Dementia   . Angina   . Shortness of breath   . TIA (transient ischemic attack)     Past Surgical History  Procedure Date  . Lasik   . Coronary angioplasty with stent placement   . Lobectomy     History reviewed. No pertinent family history.  History  Substance Use Topics  . Smoking status: Former Games developer  . Smokeless tobacco: Never Used  . Alcohol Use: No      Review of Systems  Constitutional: Negative for fatigue.  HENT: Negative for neck pain and tinnitus.   Eyes: Negative for photophobia, pain  and visual disturbance.  Respiratory: Negative for shortness of breath.   Cardiovascular: Negative for chest pain.  Gastrointestinal: Negative for nausea, vomiting and bowel incontinence.  Musculoskeletal: Negative for back pain.  Skin: Negative for wound.  Neurological: Negative for dizziness, tingling, weakness, light-headedness, numbness and headaches.  Psychiatric/Behavioral: Negative for confusion and decreased concentration.    Allergies  Review of patient's allergies indicates no known allergies.  Home Medications   Current Outpatient Rx  Name Route Sig Dispense Refill  . ALLOPURINOL 300 MG PO TABS Oral Take 300 mg by mouth every evening.     . ASPIRIN 81 MG PO TABS Oral Take 81 mg by mouth daily.      Marland Kitchen CARBIDOPA-LEVODOPA 25-100 MG PO TABS Oral Take 1 tablet by mouth 3 (three) times daily.     Marland Kitchen CLOPIDOGREL BISULFATE 75 MG PO TABS Oral Take 75 mg by mouth every evening.     Marland Kitchen MECLIZINE HCL 25 MG PO TABS Oral Take 25 mg by mouth 2 (two) times daily.     Marland Kitchen ONE-DAILY MULTI VITAMINS PO TABS Oral Take 1 tablet by mouth daily.      Marland Kitchen SIMVASTATIN 80 MG PO TABS Oral Take 40 mg by mouth at bedtime. TAKE 1/2 A TABLET      BP 164/78  Pulse 73  Temp(Src) 98.5 F (36.9 C) (Oral)  Resp 18  SpO2 97%  Physical Exam  Nursing note and  vitals reviewed. Constitutional: He is oriented to person, place, and time. He appears well-developed and well-nourished.  HENT:  Head: Normocephalic and atraumatic. Head is without raccoon's eyes and without Battle's sign.  Right Ear: Tympanic membrane, external ear and ear canal normal. No hemotympanum.  Left Ear: Tympanic membrane, external ear and ear canal normal. No hemotympanum.  Nose: Nose normal.  Mouth/Throat: Oropharynx is clear and moist.  Eyes: Conjunctivae, EOM and lids are normal. Pupils are equal, round, and reactive to light.       No visible hyphema  Neck: Normal range of motion. Neck supple.  Cardiovascular: Normal rate and regular  rhythm.   Pulmonary/Chest: Effort normal and breath sounds normal.  Abdominal: Soft. There is no tenderness.  Musculoskeletal: Normal range of motion.       Cervical back: He exhibits normal range of motion, no tenderness and no bony tenderness.       Thoracic back: He exhibits no tenderness and no bony tenderness.       Lumbar back: He exhibits no tenderness and no bony tenderness.  Neurological: He is alert and oriented to person, place, and time. He has normal strength and normal reflexes. No cranial nerve deficit or sensory deficit. Coordination normal. GCS eye subscore is 4. GCS verbal subscore is 5. GCS motor subscore is 6.  Skin: Skin is warm and dry.  Psychiatric: He has a normal mood and affect.    ED Course  Procedures (including critical care time)  Labs Reviewed  CBC - Abnormal; Notable for the following:    WBC 3.7 (*)    Hemoglobin 12.3 (*)    HCT 38.4 (*)    All other components within normal limits  DIFFERENTIAL - Abnormal; Notable for the following:    Monocytes Relative 13 (*)    Eosinophils Relative 7 (*)    All other components within normal limits  BASIC METABOLIC PANEL - Abnormal; Notable for the following:    BUN 25 (*)    GFR calc non Af Amer 46 (*)    GFR calc Af Amer 54 (*)    All other components within normal limits  URINALYSIS, ROUTINE W REFLEX MICROSCOPIC - Abnormal; Notable for the following:    APPearance CLOUDY (*)    Ketones, ur TRACE (*)    All other components within normal limits  POCT I-STAT TROPONIN I  URINE CULTURE   Ct Head Wo Contrast  02/19/2011  *RADIOLOGY REPORT*  Clinical Data: Larey Seat today.  Previous stroke.  CT HEAD WITHOUT CONTRAST  Technique:  Contiguous axial images were obtained from the base of the skull through the vertex without contrast.  Comparison: Previous CT and MR examinations.  Findings: Stable enlarged ventricles and subarachnoid spaces. Stable patchy white matter low density in both cerebral hemispheres.  Stable old  left frontal lobe infarct.  No skull fracture, intracranial hemorrhage or paranasal sinus air-fluid levels.  Sphenoid and bilateral maxillary sinus mucosal thickening with a maximum thickness of 16 mm in the right maxillary sinus and 3 mm in the left maxillary sinus.  Multiple bilateral carotid calcifications are again demonstrated.  IMPRESSION:  1.  No acute abnormality. 2.  Stable atrophy, chronic small vessel white matter ischemic changes and old left frontal lobe infarct. 3.  Chronic sphenoid and bilateral maxillary sinusitis. 4.  Chronic bilateral parotid gland calcifications.  Original Report Authenticated By: Darrol Angel, M.D.     1. Fall     5:59 PM Patient seen and examined previously, d/w Dr.  Caporosi who has seen.   Vital signs reviewed and are as follows: Filed Vitals:   02/19/11 1438  BP: 164/78  Pulse: 73  Temp: 98.5 F (36.9 C)  Resp: 18    Date: 02/19/2011  Rate: 76  Rhythm: normal sinus rhythm  QRS Axis: left  Intervals: normal  ST/T Wave abnormalities: normal  Conduction Disutrbances:first-degree A-V block , right bundle branch block and left anterior fascicular block  Narrative Interpretation:   Old EKG Reviewed: unchanged (04/06/10)  Workup is unconcerning. Will discharge back to nursing facility. Patient informed of results. Urged to return with worsening symptoms. Patient verbalizes understanding and agrees with plan.    MDM  Patients with fall. No evidence of head injury. CT is negative for bleed. Remainder of lab work is not concerning. The patient appears comfortable, is eating. He is stable for discharge.       Christopher Cook [place], [place] 02/19/11 2048

## 2011-02-19 NOTE — ED Notes (Signed)
Report called to [facility].  [facility] called for transport.

## 2011-02-19 NOTE — ED Notes (Signed)
Per EMS, pt slipped and fell 1 1/2 hrs ago while walking and pushing a wheelchair, pt denies injury, there was no reported LOC, pt denies pain, EMS reports pt was sent from nursing home for evaluation s/p fall

## 2011-02-19 NOTE — ED Notes (Signed)
Family states they are going to go home and request to be called with information regarding examination and disposition.  Grand daughter, Sharl Ma can be reached at [phone] Salvatore Decent can be reached at [phone]

## 2011-02-19 NOTE — ED Notes (Signed)
Pt eating meal tray 

## 2011-02-19 NOTE — ED Provider Notes (Signed)
Medical screening examination/treatment/procedure(s) were conducted as a shared visit with non-physician practitioner(s) and myself.  I personally evaluated the patient during the encounter 78 year old, male, with dementia, who takes Plavix, presents from the nursing home after he slipped and fell, while pushing a wheelchair.  He was sent from the nursing home for evaluation.  The patient.  Denies pain anywhere.  His physical examination shows no evidence of significant injury.  Says that his age and the fact that he is on Plavix.  We will perform a head CT as well as laboratory testing, to look for an etiology for possible weakness that may have contributed to her fall.  Nicholes Stairs, MD 02/19/11 [phone]

## 2011-02-20 LAB — URINE CULTURE: Culture  Setup Time: 201302172132

## 2011-02-20 NOTE — ED Provider Notes (Signed)
Medical screening examination/treatment/procedure(s) were performed by non-physician practitioner and as supervising physician I was immediately available for consultation/collaboration.  Derik Fults P Nury Nebergall, MD 02/20/11 0716 

## 2011-02-21 ENCOUNTER — Emergency Department (HOSPITAL_COMMUNITY)
Admission: EM | Admit: 2011-02-21 | Discharge: 2011-02-21 | Disposition: A | Discharge status: Home / Self Care | Payer: Medicare Other | Attending: Emergency Medicine | Admitting: Emergency Medicine

## 2011-02-21 ENCOUNTER — Encounter (HOSPITAL_COMMUNITY): Payer: Self-pay | Admitting: Emergency Medicine

## 2011-02-21 DIAGNOSIS — R29810 Facial weakness: Secondary | ICD-10-CM | POA: Insufficient documentation

## 2011-02-21 DIAGNOSIS — Z79899 Other long term (current) drug therapy: Secondary | ICD-10-CM | POA: Insufficient documentation

## 2011-02-21 DIAGNOSIS — F0390 Unspecified dementia without behavioral disturbance: Secondary | ICD-10-CM | POA: Insufficient documentation

## 2011-02-21 DIAGNOSIS — R4189 Other symptoms and signs involving cognitive functions and awareness: Secondary | ICD-10-CM

## 2011-02-21 DIAGNOSIS — Z8679 Personal history of other diseases of the circulatory system: Secondary | ICD-10-CM | POA: Insufficient documentation

## 2011-02-21 DIAGNOSIS — G20 Parkinson's disease: Secondary | ICD-10-CM | POA: Insufficient documentation

## 2011-02-21 DIAGNOSIS — G20A1 Parkinson's disease without dyskinesia, without mention of fluctuations: Secondary | ICD-10-CM | POA: Insufficient documentation

## 2011-02-21 DIAGNOSIS — R404 Transient alteration of awareness: Secondary | ICD-10-CM | POA: Insufficient documentation

## 2011-02-21 DIAGNOSIS — R109 Unspecified abdominal pain: Secondary | ICD-10-CM | POA: Insufficient documentation

## 2011-02-21 DIAGNOSIS — I10 Essential (primary) hypertension: Secondary | ICD-10-CM | POA: Insufficient documentation

## 2011-02-21 LAB — URINALYSIS, ROUTINE W REFLEX MICROSCOPIC
Glucose, UA: NEGATIVE mg/dL
Leukocytes, UA: NEGATIVE
Specific Gravity, Urine: 1.015 (ref 1.005–1.030)
pH: 6 (ref 5.0–8.0)

## 2011-02-21 LAB — URINE MICROSCOPIC-ADD ON

## 2011-02-21 NOTE — ED Notes (Signed)
Patient voided upon arrival.  Patient states he is unable to void at this time.

## 2011-02-21 NOTE — ED Notes (Signed)
Pt to ED via EMS from [facility].  Per EMS, pt doing rehab from previous TIA when tech noted left sided facial weakness and ride sided extremity weakness lasting approximately 5 minutes.

## 2011-02-21 NOTE — Discharge Instructions (Signed)
Followup with your primary care Dr. as needed for problems.

## 2011-02-21 NOTE — ED Notes (Signed)
PTAR notified  For  Pt transport to [facility] .

## 2011-02-21 NOTE — ED Provider Notes (Cosign Needed)
History     CSN: [id number]  Arrival date & time 02/21/11  1628   First MD Initiated Contact with Patient 02/21/11 1642      Chief Complaint  Patient presents with  . Extremity Weakness    (Consider location/radiation/quality/duration/timing/severity/associated sxs/prior treatment) HPI Level V Caveat-Dementia  Christopher Cook is a 78 y.o. male-who was in speech therapy at his skilled nursing facility when he had a sudden loss of consciousness and left-sided facial droop. Both symptoms were apparently transient. It is not clear exactly how long they lasted possibly the duration was 2 minutes. The patient is in rehabilitation for TIA. He has been in the emergency department twice in the last several days for falls. He was discharged both times after a evaluation.   Past Medical History  Diagnosis Date  . Lung cancer dx'd 2006    surg only  . Hypertension   . Stroke   . Parkinson disease   . Coronary artery disease   . Gout   . Dementia   . Angina   . Shortness of breath   . TIA (transient ischemic attack)     Past Surgical History  Procedure Date  . Lasik   . Coronary angioplasty with stent placement   . Lobectomy     No family history on file.  History  Substance Use Topics  . Smoking status: Former Games developer  . Smokeless tobacco: Never Used  . Alcohol Use: No      Review of Systems  Unable to perform ROS   Allergies  Review of patient's allergies indicates no known allergies.  Home Medications   Current Outpatient Rx  Name Route Sig Dispense Refill  . ALLOPURINOL 300 MG PO TABS Oral Take 300 mg by mouth every evening.     . ASPIRIN 81 MG PO TABS Oral Take 81 mg by mouth daily.      Marland Kitchen CARBIDOPA-LEVODOPA 25-100 MG PO TABS Oral Take 1 tablet by mouth 3 (three) times daily.     Marland Kitchen CLOPIDOGREL BISULFATE 75 MG PO TABS Oral Take 75 mg by mouth every evening.     Marland Kitchen MECLIZINE HCL 25 MG PO TABS Oral Take 25 mg by mouth 2 (two) times daily.     Marland Kitchen ONE-DAILY MULTI  VITAMINS PO TABS Oral Take 1 tablet by mouth daily.      Marland Kitchen SIMVASTATIN 80 MG PO TABS Oral Take 40 mg by mouth at bedtime. TAKE 1/2 A TABLET      BP 192/82  Pulse 61  Temp(Src) 98 F (36.7 C) (Oral)  Resp 15  SpO2 100%  Physical Exam  Nursing note and vitals reviewed. Constitutional: He appears well-developed and well-nourished.  HENT:  Head: Normocephalic and atraumatic.  Right Ear: External ear normal.  Left Ear: External ear normal.  Eyes: Conjunctivae and EOM are normal. Pupils are equal, round, and reactive to light.  Neck: Normal range of motion and phonation normal. Neck supple.  Cardiovascular: Normal rate, regular rhythm, normal heart sounds and intact distal pulses.   Pulmonary/Chest: Effort normal and breath sounds normal. He exhibits no bony tenderness.  Abdominal: Soft. Normal appearance. There is no tenderness.  Musculoskeletal: Normal range of motion.  Neurological: He has normal strength. No cranial nerve deficit or sensory deficit. He exhibits normal muscle tone. Coordination normal.       Follows simple commands accurately  Skin: Skin is warm, dry and intact.  Psychiatric: He has a normal mood and affect. His behavior is normal.  ED Course  Procedures (including critical care time) Patient ambulated easily in emergency department to the bathroom. He was alert on initial exam.  1731: The patient's granddaughter is here now, and states that he is back at his baseline. She also reports that he typically has spells as he did earlier today. She states that that is the reason he is at the SNF.  Case was discussed with Dr. Wylene Simmer.   Labs Reviewed  URINALYSIS, ROUTINE W REFLEX MICROSCOPIC - Abnormal; Notable for the following:    Hgb urine dipstick TRACE (*)    All other components within normal limits  URINE MICROSCOPIC-ADD ON - Abnormal; Notable for the following:    Squamous Epithelial / LPF FEW (*)    All other components within normal limits  URINE  CULTURE   No results found.   1. Decreased responsiveness       MDM  Patient returned to baseline after a recurrent episode of decreased responsiveness, for which has been fully evaluated, and is in rehabilitation for. Further workup is not indicated at this time. He is stable for discharge.        Flint Melter, MD 02/22/11 1351

## 2011-02-23 LAB — URINE CULTURE: Colony Count: 80000

## 2011-02-24 NOTE — ED Notes (Signed)
Treated per protocol..colony count 80,000

## 2011-05-11 ENCOUNTER — Emergency Department (HOSPITAL_COMMUNITY)
Admission: EM | Admit: 2011-05-11 | Discharge: 2011-05-12 | Disposition: A | Discharge status: Home / Self Care | Payer: Medicare Other | Attending: Emergency Medicine | Admitting: Emergency Medicine

## 2011-05-11 ENCOUNTER — Emergency Department (HOSPITAL_COMMUNITY): Payer: Medicare Other

## 2011-05-11 DIAGNOSIS — F0390 Unspecified dementia without behavioral disturbance: Secondary | ICD-10-CM | POA: Insufficient documentation

## 2011-05-11 DIAGNOSIS — W19XXXA Unspecified fall, initial encounter: Secondary | ICD-10-CM | POA: Insufficient documentation

## 2011-05-11 DIAGNOSIS — G20 Parkinson's disease: Secondary | ICD-10-CM | POA: Insufficient documentation

## 2011-05-11 DIAGNOSIS — I2510 Atherosclerotic heart disease of native coronary artery without angina pectoris: Secondary | ICD-10-CM | POA: Insufficient documentation

## 2011-05-11 DIAGNOSIS — M25569 Pain in unspecified knee: Secondary | ICD-10-CM | POA: Insufficient documentation

## 2011-05-11 DIAGNOSIS — Z8639 Personal history of other endocrine, nutritional and metabolic disease: Secondary | ICD-10-CM | POA: Insufficient documentation

## 2011-05-11 DIAGNOSIS — M25559 Pain in unspecified hip: Secondary | ICD-10-CM | POA: Insufficient documentation

## 2011-05-11 DIAGNOSIS — Z85118 Personal history of other malignant neoplasm of bronchus and lung: Secondary | ICD-10-CM | POA: Insufficient documentation

## 2011-05-11 DIAGNOSIS — Z862 Personal history of diseases of the blood and blood-forming organs and certain disorders involving the immune mechanism: Secondary | ICD-10-CM | POA: Insufficient documentation

## 2011-05-11 DIAGNOSIS — R51 Headache: Secondary | ICD-10-CM | POA: Insufficient documentation

## 2011-05-11 DIAGNOSIS — Z8673 Personal history of transient ischemic attack (TIA), and cerebral infarction without residual deficits: Secondary | ICD-10-CM | POA: Insufficient documentation

## 2011-05-11 DIAGNOSIS — G20A1 Parkinson's disease without dyskinesia, without mention of fluctuations: Secondary | ICD-10-CM | POA: Insufficient documentation

## 2011-05-11 DIAGNOSIS — I10 Essential (primary) hypertension: Secondary | ICD-10-CM | POA: Insufficient documentation

## 2011-05-11 DIAGNOSIS — Z79899 Other long term (current) drug therapy: Secondary | ICD-10-CM | POA: Insufficient documentation

## 2011-05-11 LAB — URINALYSIS, ROUTINE W REFLEX MICROSCOPIC
Glucose, UA: NEGATIVE mg/dL
Protein, ur: 30 mg/dL — AB

## 2011-05-11 LAB — URINE MICROSCOPIC-ADD ON

## 2011-05-11 LAB — BASIC METABOLIC PANEL
CO2: 30 mEq/L (ref 19–32)
Calcium: 9.3 mg/dL (ref 8.4–10.5)
Creatinine, Ser: 1.54 mg/dL — ABNORMAL HIGH (ref 0.50–1.35)
Glucose, Bld: 88 mg/dL (ref 70–99)
Sodium: 142 mEq/L (ref 135–145)

## 2011-05-11 LAB — DIFFERENTIAL
Eosinophils Relative: 4 % (ref 0–5)
Lymphocytes Relative: 22 % (ref 12–46)
Lymphs Abs: 0.8 10*3/uL (ref 0.7–4.0)
Monocytes Absolute: 0.7 10*3/uL (ref 0.1–1.0)

## 2011-05-11 LAB — CBC
HCT: 36 % — ABNORMAL LOW (ref 39.0–52.0)
Hemoglobin: 11.5 g/dL — ABNORMAL LOW (ref 13.0–17.0)
MCV: 87.6 fL (ref 78.0–100.0)
RBC: 4.11 MIL/uL — ABNORMAL LOW (ref 4.22–5.81)
WBC: 3.4 10*3/uL — ABNORMAL LOW (ref 4.0–10.5)

## 2011-05-11 MED ORDER — ONDANSETRON HCL 8 MG PO TABS: 4.0000 mg | ORAL_TABLET | Freq: Three times a day (TID) | ORAL | Status: DC | PRN

## 2011-05-11 MED ORDER — ZOLPIDEM TARTRATE 5 MG PO TABS: 5.0000 mg | ORAL_TABLET | Freq: Every evening | ORAL | Status: DC | PRN

## 2011-05-11 MED ORDER — ACETAMINOPHEN 325 MG PO TABS: 650.0000 mg | ORAL_TABLET | ORAL | Status: DC | PRN

## 2011-05-11 MED ORDER — LORAZEPAM 1 MG PO TABS: 1.0000 mg | ORAL_TABLET | Freq: Three times a day (TID) | ORAL | Status: DC | PRN

## 2011-05-11 MED ORDER — IBUPROFEN 200 MG PO TABS: 600.0000 mg | ORAL_TABLET | Freq: Three times a day (TID) | ORAL | Status: DC | PRN

## 2011-05-11 NOTE — ED Notes (Signed)
MD spoke to family about keeping pt in CDU overnight.

## 2011-05-11 NOTE — ED Notes (Signed)
Pt aware of need for urine sample.  Pt will press call bell when ready to provide sample.

## 2011-05-11 NOTE — ED Notes (Signed)
Pt back from x-ray.

## 2011-05-11 NOTE — ED Notes (Signed)
[name] # [phone] [name] # [phone] Family password is "[email or web address]"

## 2011-05-11 NOTE — ED Notes (Signed)
Meal tray at bedside.  Adjusted pt in stretcher and cut up food per pt request.  Pt eating and in NAD, respirations even and unlabored.

## 2011-05-11 NOTE — ED Notes (Signed)
RN and EMT attempted in and out cath with no urinary return.  Condom cath was then placed and will be monitored for output.  Pt resting comfortably at this time with no complaints

## 2011-05-11 NOTE — ED Notes (Signed)
Patient transported to X-ray 

## 2011-05-11 NOTE — ED Notes (Signed)
Spoke with pt's granddaughter re: ALF placement for pt.  Pt is scheduled to be admitted to [facility]F tomorrow.  Granddaughter brought him here today to make sure he is ok to go to the ALF.  Ideally, per granddaughter, she wants pt to be admitted for observation and sent to the ALF from here.  CSW explained that pt needed to meet medical criteria before he could be admitted to the hospital and that would be determined by the MD.  Granddaughter indicated that she understood.

## 2011-05-11 NOTE — ED Notes (Signed)
Pt continues eating and watching TV in stretcher.  Pt denies any needs at this time.

## 2011-05-11 NOTE — ED Notes (Signed)
Pt sitting in stretcher in NAD, respirations even and unlabored. 

## 2011-05-11 NOTE — ED Notes (Signed)
Report given to CDU.  Pt taken to room 2 by tech.

## 2011-05-11 NOTE — ED Notes (Signed)
Per family and ems pt had stroke and had been to [facility] for rehab. Returned home to family around the 46 of march. Family states that for the last couple of weeks pt has had increasing about of falls. Pt fell today at ~ 0430 and ? Hit is head pt is on plavix for stent  walker was on top of him per family . Pt aslo has been having increasin confusion for the last week . Family  States that he has had more wet diapers and urine has had a strong odor. also

## 2011-05-11 NOTE — ED Notes (Signed)
Pt back from CT.  Pt in NAD, respirations even and unlabored.

## 2011-05-11 NOTE — ED Notes (Signed)
Patient transported to CT 

## 2011-05-11 NOTE — ED Notes (Signed)
Condom cath placed on pt to obtain CCUS.

## 2011-05-11 NOTE — ED Provider Notes (Signed)
History     CSN: [id number]  Arrival date & time 05/11/11  1445   First MD Initiated Contact with Patient 05/11/11 1455      Chief Complaint  Patient presents with  . Fall    (Consider location/radiation/quality/duration/timing/severity/associated sxs/prior treatment) Patient is a 78 y.o. male presenting with fall. The history is provided by the patient and a relative. History Limited By: dementia.  Fall  He has been having increased frequency of falls for the last several weeks. None of the falls have been witnessed. He remembers that he fell but does not remember what happened. He denies any injury. His daughter states that he has been treated for Parkinson's disease and strokes. There is also a report that his urine has had a stronger odor than normal.  Past Medical History  Diagnosis Date  . Lung cancer dx'd 2006    surg only  . Hypertension   . Stroke   . Parkinson disease   . Coronary artery disease   . Gout   . Dementia   . Angina   . Shortness of breath   . TIA (transient ischemic attack)     Past Surgical History  Procedure Date  . Lasik   . Coronary angioplasty with stent placement   . Lobectomy     No family history on file.  History  Substance Use Topics  . Smoking status: Former Games developer  . Smokeless tobacco: Never Used  . Alcohol Use: No      Review of Systems  Unable to perform ROS: Dementia    Allergies  Review of patient's allergies indicates no known allergies.  Home Medications   Current Outpatient Rx  Name Route Sig Dispense Refill  . ALLOPURINOL 300 MG PO TABS Oral Take 300 mg by mouth every evening.     . ASPIRIN 81 MG PO TABS Oral Take 81 mg by mouth daily.      Marland Kitchen CLOPIDOGREL BISULFATE 75 MG PO TABS Oral Take 75 mg by mouth every evening.     Marland Kitchen DIVALPROEX SODIUM 250 MG PO TBEC Oral Take 250 mg by mouth 2 (two) times daily.    Marland Kitchen ESCITALOPRAM OXALATE 10 MG PO TABS Oral Take 10 mg by mouth daily.    Marland Kitchen MECLIZINE HCL 50 MG PO TABS  Oral Take 25 mg by mouth 2 (two) times daily.    . ADULT MULTIVITAMIN W/MINERALS CH Oral Take 1 tablet by mouth daily.    Marland Kitchen SIMVASTATIN 40 MG PO TABS Oral Take 40 mg by mouth every evening.      BP 161/69  Pulse 70  Temp(Src) 98.4 F (36.9 C) (Oral)  Resp 16  SpO2 98%  Physical Exam  Nursing note and vitals reviewed.  78 year old male who is resting comfortably and in no acute distress. Vital signs are significant for mild hypertension with blood pressure 161/69. Oxygen saturation is 98% which is normal. Head is normocephalic and atraumatic. PERRLA, EOMI. Oropharynx is clear. Neck is nontender and supple without adenopathy, JVD, or bruit. Back is nontender. Lungs are clear without rales, wheezes, rhonchi. Heart has regular rate rhythm without murmur. Abdomen is soft, flat, nontender without masses or hepatosplenomegaly. Extremities have no cyanosis or edema, full passive range of motion is present. Skin is warm and dry without rash. Neurologic: He is awake and alert and oriented to person. Cranial nerves are grossly intact. He has good strength in all extremities but some cogwheel rigidity is noted as well as generalized increased muscle tone.  No focal sensory deficits are noted.  ED Course  Procedures (including critical care time)  Results for orders placed during the hospital encounter of 05/11/11  CBC      Component Value Range   WBC 3.4 (*) 4.0 - 10.5 (K/uL)   RBC 4.11 (*) 4.22 - 5.81 (MIL/uL)   Hemoglobin 11.5 (*) 13.0 - 17.0 (g/dL)   HCT 16.1 (*) 09.6 - 52.0 (%)   MCV 87.6  78.0 - 100.0 (fL)   MCH 28.0  26.0 - 34.0 (pg)   MCHC 31.9  30.0 - 36.0 (g/dL)   RDW 04.5  40.9 - 81.1 (%)   Platelets 134 (*) 150 - 400 (K/uL)  DIFFERENTIAL      Component Value Range   Neutrophils Relative 52  43 - 77 (%)   Neutro Abs 1.8  1.7 - 7.7 (K/uL)   Lymphocytes Relative 22  12 - 46 (%)   Lymphs Abs 0.8  0.7 - 4.0 (K/uL)   Monocytes Relative 21 (*) 3 - 12 (%)   Monocytes Absolute 0.7  0.1 -  1.0 (K/uL)   Eosinophils Relative 4  0 - 5 (%)   Eosinophils Absolute 0.2  0.0 - 0.7 (K/uL)   Basophils Relative 0  0 - 1 (%)   Basophils Absolute 0.0  0.0 - 0.1 (K/uL)  BASIC METABOLIC PANEL      Component Value Range   Sodium 142  135 - 145 (mEq/L)   Potassium 4.3  3.5 - 5.1 (mEq/L)   Chloride 104  96 - 112 (mEq/L)   CO2 30  19 - 32 (mEq/L)   Glucose, Bld 88  70 - 99 (mg/dL)   BUN 31 (*) 6 - 23 (mg/dL)   Creatinine, Ser 9.14 (*) 0.50 - 1.35 (mg/dL)   Calcium 9.3  8.4 - 78.2 (mg/dL)   GFR calc non Af Amer 39 (*) >90 (mL/min)   GFR calc Af Amer 45 (*) >90 (mL/min)  URINALYSIS, ROUTINE W REFLEX MICROSCOPIC      Component Value Range   Color, Urine AMBER (*) YELLOW    APPearance CLOUDY (*) CLEAR    Specific Gravity, Urine 1.024  1.005 - 1.030    pH 6.5  5.0 - 8.0    Glucose, UA NEGATIVE  NEGATIVE (mg/dL)   Hgb urine dipstick LARGE (*) NEGATIVE    Bilirubin Urine NEGATIVE  NEGATIVE    Ketones, ur 15 (*) NEGATIVE (mg/dL)   Protein, ur 30 (*) NEGATIVE (mg/dL)   Urobilinogen, UA 1.0  0.0 - 1.0 (mg/dL)   Nitrite NEGATIVE  NEGATIVE    Leukocytes, UA SMALL (*) NEGATIVE   URINE MICROSCOPIC-ADD ON      Component Value Range   Squamous Epithelial / LPF RARE  RARE    WBC, UA 0-2  <3 (WBC/hpf)   RBC / HPF TOO NUMEROUS TO COUNT  <3 (RBC/hpf)   Bacteria, UA RARE  RARE    Urine-Other MUCOUS PRESENT     Dg Pelvis 1-2 Views  05/11/2011  *RADIOLOGY REPORT*  Clinical Data: Recent falls, pelvic and knee pain  PELVIS - 1-2 VIEW  Comparison: PET CT of 05/11/2010  Findings: No acute fracture is seen.  The hip joint spaces are grossly normal for age.  The SI joints appear normal.  There is degenerative change in the lower lumbar spine.  IMPRESSION: No acute fracture.  Original Report Authenticated By: Juline Patch, M.D.   Ct Head Wo Contrast  05/11/2011  *RADIOLOGY REPORT*  Clinical Data:  Head pain.  Head trauma.  Anticoagulated patient. Increasing frequency of falls.  CT HEAD WITHOUT CONTRAST CT  CERVICAL SPINE WITHOUT CONTRAST  Technique:  Multidetector CT imaging of the head and cervical spine was performed following the standard protocol without intravenous contrast.  Multiplanar CT image reconstructions of the cervical spine were also generated.  Comparison:  01/30/2011 head CT.  02/19/2011 head CT.  CT HEAD  Findings: C3-C4 ACDF is noted on the scout views.  The patient is edentulous.  Frothy secretions are present in the right maxillary sinus.  This sinus disease is chronic.  Left maxillary sinus appears clear.  Mastoid air cells clear.  Dystrophic calcifications are present in both parotid glands.  Calcifications are present in both parotid glands which may be dystrophic or represent salivary stones.  Mastoid air cells appear clear.  Left posterior frontal lobe encephalomalacia. No mass lesion, mass effect, midline shift, hydrocephalus, hemorrhage.  No acute territorial cortical ischemia/infarct. Atrophy and chronic ischemic white matter disease is present.  Scattered small lacunar infarcts are present which appears similar compared to prior exam.  IMPRESSION: No acute intracranial abnormality.  Old left frontal infarct and scattered lacunar infarcts. Atrophy and chronic ischemic white matter disease.  Chronic right maxillary sinus disease.  CT CERVICAL SPINE  Findings: Multilevel cervical spondylosis.  Bilateral pleural apical scarring is present in the lungs.  Nonspecific enlargement of the right thyroid lobe greater than left probably represents thyroid goiter.  This appears similar to prior CT 05/11/2010.  The carotid atherosclerosis is present.   Mild straightening of the normal cervical lordosis.  C3-C4 fusion is solid.  C5-C6 shallow disc osteophyte complex.  Multilevel uncovertebral spurring and facet arthrosis with foraminal encroachment.  IMPRESSION: No acute abnormality.  Solid C3-C4 fusion.  Mild to moderate cervical spondylosis.  Original Report Authenticated By: Andreas Newport, M.D.    Ct Cervical Spine Wo Contrast  05/11/2011  *RADIOLOGY REPORT*  Clinical Data:  Head pain.  Head trauma.  Anticoagulated patient. Increasing frequency of falls.  CT HEAD WITHOUT CONTRAST CT CERVICAL SPINE WITHOUT CONTRAST  Technique:  Multidetector CT imaging of the head and cervical spine was performed following the standard protocol without intravenous contrast.  Multiplanar CT image reconstructions of the cervical spine were also generated.  Comparison:  01/30/2011 head CT.  02/19/2011 head CT.  CT HEAD  Findings: C3-C4 ACDF is noted on the scout views.  The patient is edentulous.  Frothy secretions are present in the right maxillary sinus.  This sinus disease is chronic.  Left maxillary sinus appears clear.  Mastoid air cells clear.  Dystrophic calcifications are present in both parotid glands.  Calcifications are present in both parotid glands which may be dystrophic or represent salivary stones.  Mastoid air cells appear clear.  Left posterior frontal lobe encephalomalacia. No mass lesion, mass effect, midline shift, hydrocephalus, hemorrhage.  No acute territorial cortical ischemia/infarct. Atrophy and chronic ischemic white matter disease is present.  Scattered small lacunar infarcts are present which appears similar compared to prior exam.  IMPRESSION: No acute intracranial abnormality.  Old left frontal infarct and scattered lacunar infarcts. Atrophy and chronic ischemic white matter disease.  Chronic right maxillary sinus disease.  CT CERVICAL SPINE  Findings: Multilevel cervical spondylosis.  Bilateral pleural apical scarring is present in the lungs.  Nonspecific enlargement of the right thyroid lobe greater than left probably represents thyroid goiter.  This appears similar to prior CT 05/11/2010.  The carotid atherosclerosis is present.   Mild straightening of the normal  cervical lordosis.  C3-C4 fusion is solid.  C5-C6 shallow disc osteophyte complex.  Multilevel uncovertebral spurring and facet  arthrosis with foraminal encroachment.  IMPRESSION: No acute abnormality.  Solid C3-C4 fusion.  Mild to moderate cervical spondylosis.  Original Report Authenticated By: Andreas Newport, M.D.   Dg Knee Complete 4 Views Left  05/11/2011  *RADIOLOGY REPORT*  Clinical Data: Recent falls, knee pain  LEFT KNEE - COMPLETE 4+ VIEW  Comparison: None.  Findings: No acute fracture is seen.  Joint spaces appear normal. There does appear to be faint chondrocalcinosis present which can be seen with osteoarthritis or CPPD.  No joint effusion is seen.  IMPRESSION:  No acute bony abnormality.  Faint chondrocalcinosis.  Original Report Authenticated By: Juline Patch, M.D.   Dg Knee Complete 4 Views Right  05/11/2011  *RADIOLOGY REPORT*  Clinical Data: Recent falls, knee pain  RIGHT KNEE - COMPLETE 4+ VIEW  Comparison: None.  Findings: The right knee joint spaces appear normal.  No fracture is seen.  No definite effusion is noted.  IMPRESSION: Negative.  Original Report Authenticated By: Juline Patch, M.D.     Patient's family has requested social service consultation to see if he can be placed in assisted living because of multiple falls. Also, they are concerned that he does not seem to be walking regimen and requested that I x-rays be obtained of his hips and knees. She comments that patient frequently will not voice complaints of pain even when things are hurting.  Workup is unremarkable. Patient has been seen by social service and arrangements have been made to have him go to an assisted-living facility tomorrow at 9 AM. Family is uncomfortable with him being home because of multiple falls. He will be placed in CDU for observation until he can be transferred to the assisted living facility.  1. Fall   2. Dementia       MDM  Multiple falls without evidence of significant injury you. He is on aspirin and Plavix so CT scan will be obtained to evaluate for possible intracranial injury. Electrolytes and urinalysis  will be checked looking for potential reversible causes of falls. Old records are reviewed and he does have prior ED visits for falls.        Dione Booze, MD 05/13/11 [phone]

## 2011-05-12 NOTE — ED Notes (Signed)
Pt reports that he is 'fine'.  Denies needs.  States that he is comfortable.  Spoke with [place], PA.  Reports that pt will be d/c if CSW is unable to find placement for him.  Granddaughter updated on POC by [place], Charity fundraiser.

## 2011-05-12 NOTE — ED Notes (Signed)
CSW spoke to [employer] and they are expecting Christopher Cook today. Family has already been there this morning to get things ready and [facility] is ready for Christopher Cook at this time. Christopher Cook should dc when family arrives. Frederico Hamman, LCSW [phone]

## 2011-05-12 NOTE — ED Notes (Signed)
Clinical Social Worker received call from RN for update on pt's placement. The evening CSW reported that the Pt is scheduled to go to [facility] today as a new resident per family's report.  CSW called Pt's granddaughter and left msg requesting return call. CSW called [facility] admissions coordinator and she is in a mtg at this time. CSW will continue to f/u to clarify dc plan for pt today.  Frederico Hamman, LCSW [phone]

## 2011-05-12 NOTE — Discharge Instructions (Signed)
Return to the Emergency Department if symptoms change or worsen.

## 2011-05-12 NOTE — ED Notes (Signed)
Pt dressed, put shoes on, wheeled out to car and assisted to get into car.  pts son at bedside.  Pt urinated in urinal prior to being wheeled out.

## 2011-05-12 NOTE — ED Provider Notes (Signed)
7:00 AM  Assumed care of patient in the CDU from Dr. Jeraldine Loots.  Patient with history of dementia comes in today with a chief complaint of repeated falls.  Patient is currently awaiting Nursing home placement.  Social worker working on placement today.   7:20 AM Reassessed patient.  Patient does not have any complaints at this time.  Denies pain.  No acute distress.  Patient alert.  VSS.  PERRLA, EOM intact, Heart RRR, Lungs CTAB, Abdomen soft and nontender.  Patient awaiting Nursing home placement. 10:16 AM Patient found placement at [facility].  VSS.  No acute distress.  Will discharge patient to [facility].    Pascal [place], PA-C 05/12/11 1709

## 2011-05-17 NOTE — ED Provider Notes (Signed)
See CDU documentation.  Dione Booze, MD 05/17/11 [phone]

## 2011-06-21 ENCOUNTER — Telehealth: Payer: Self-pay | Admitting: Internal Medicine

## 2011-06-21 NOTE — Telephone Encounter (Signed)
lm that md appt was moved to 7/3 from 6/27  aom

## 2011-06-28 ENCOUNTER — Other Ambulatory Visit (HOSPITAL_COMMUNITY): Payer: Medicare Other

## 2011-06-28 ENCOUNTER — Other Ambulatory Visit: Payer: Medicare Other | Admitting: Lab

## 2011-06-29 ENCOUNTER — Ambulatory Visit: Payer: Medicare Other | Admitting: Internal Medicine

## 2011-07-05 ENCOUNTER — Ambulatory Visit: Payer: Medicare Other | Admitting: Internal Medicine

## 2011-07-13 ENCOUNTER — Ambulatory Visit: Payer: Medicare Other | Admitting: Radiation Oncology

## 2011-07-26 ENCOUNTER — Telehealth: Payer: Self-pay | Admitting: Medical Oncology

## 2011-07-26 DIAGNOSIS — C3490 Malignant neoplasm of unspecified part of unspecified bronchus or lung: Secondary | ICD-10-CM

## 2011-07-26 NOTE — Telephone Encounter (Signed)
Pt living in [facility] now because of his frequent falling at home. His son will call Ct to r/s scan

## 2011-07-27 ENCOUNTER — Ambulatory Visit: Payer: Medicare Other | Admitting: Radiation Oncology

## 2011-08-09 ENCOUNTER — Other Ambulatory Visit (HOSPITAL_COMMUNITY): Payer: Medicare Other

## 2011-08-09 ENCOUNTER — Inpatient Hospital Stay (HOSPITAL_COMMUNITY): Admission: RE | Admit: 2011-08-09 | Payer: Medicare Other | Source: Ambulatory Visit

## 2011-08-16 ENCOUNTER — Other Ambulatory Visit (HOSPITAL_COMMUNITY): Payer: Medicare Other

## 2011-08-18 ENCOUNTER — Other Ambulatory Visit (HOSPITAL_COMMUNITY): Payer: Medicare Other

## 2011-08-24 ENCOUNTER — Other Ambulatory Visit (HOSPITAL_COMMUNITY): Payer: Medicare Other

## 2011-08-31 ENCOUNTER — Telehealth: Payer: Self-pay | Admitting: Medical Oncology

## 2011-08-31 ENCOUNTER — Ambulatory Visit: Payer: Medicare Other | Admitting: Radiation Oncology

## 2011-08-31 NOTE — Telephone Encounter (Signed)
Chart review.

## 2011-09-18 ENCOUNTER — Other Ambulatory Visit (HOSPITAL_COMMUNITY): Payer: Medicare Other

## 2011-09-18 ENCOUNTER — Telehealth: Payer: Self-pay | Admitting: Internal Medicine

## 2011-09-18 ENCOUNTER — Other Ambulatory Visit: Payer: Self-pay | Admitting: Medical Oncology

## 2011-09-18 DIAGNOSIS — C3490 Malignant neoplasm of unspecified part of unspecified bronchus or lung: Secondary | ICD-10-CM

## 2011-09-18 NOTE — Telephone Encounter (Signed)
l/m with appt info for 9/19

## 2011-09-18 NOTE — Telephone Encounter (Signed)
Pt's daughter came by wants copy of appt for Ct and Dr. Michell Heinrich , also called Diane, nurse left message regarding request for lab

## 2011-09-19 ENCOUNTER — Ambulatory Visit (HOSPITAL_COMMUNITY)
Admission: RE | Admit: 2011-09-19 | Discharge: 2011-09-19 | Disposition: A | Discharge status: Home / Self Care | Payer: Medicare Other | Source: Ambulatory Visit | Attending: Internal Medicine | Admitting: Internal Medicine

## 2011-09-19 DIAGNOSIS — I2510 Atherosclerotic heart disease of native coronary artery without angina pectoris: Secondary | ICD-10-CM | POA: Insufficient documentation

## 2011-09-19 DIAGNOSIS — C3490 Malignant neoplasm of unspecified part of unspecified bronchus or lung: Secondary | ICD-10-CM | POA: Insufficient documentation

## 2011-09-19 DIAGNOSIS — Z902 Acquired absence of lung [part of]: Secondary | ICD-10-CM | POA: Insufficient documentation

## 2011-09-19 DIAGNOSIS — R911 Solitary pulmonary nodule: Secondary | ICD-10-CM | POA: Insufficient documentation

## 2011-09-19 DIAGNOSIS — I700 Atherosclerosis of aorta: Secondary | ICD-10-CM | POA: Insufficient documentation

## 2011-09-19 DIAGNOSIS — N289 Disorder of kidney and ureter, unspecified: Secondary | ICD-10-CM | POA: Insufficient documentation

## 2011-09-19 DIAGNOSIS — E049 Nontoxic goiter, unspecified: Secondary | ICD-10-CM | POA: Insufficient documentation

## 2011-09-21 ENCOUNTER — Ambulatory Visit
Admission: RE | Admit: 2011-09-21 | Discharge: 2011-09-21 | Disposition: A | Discharge status: Home / Self Care | Payer: Medicare Other | Source: Ambulatory Visit | Attending: Radiation Oncology | Admitting: Radiation Oncology

## 2011-09-21 ENCOUNTER — Encounter: Payer: Self-pay | Admitting: Radiation Oncology

## 2011-09-21 ENCOUNTER — Ambulatory Visit: Payer: Medicare Other | Admitting: Internal Medicine

## 2011-09-21 VITALS — BP 170/76 | HR 70 | Temp 98.2°F | Resp 20

## 2011-09-21 DIAGNOSIS — C3490 Malignant neoplasm of unspecified part of unspecified bronchus or lung: Secondary | ICD-10-CM | POA: Insufficient documentation

## 2011-09-21 NOTE — Progress Notes (Signed)
PATIENT ARRIVED VIA W/C FROM [facility],    , F/U S/P RAD TXS:05/23/2010-05/31/10 54 Gy AT 18 Gy /FRACTIONS X3 RUL lung  Patient alert,oriented, x3, no coughing, states he is eating okay, no nausea,  97% room air sats,  No c/o pain,  CT chest 09/19/11  Results in 12:32 PM

## 2011-09-21 NOTE — Progress Notes (Signed)
   Department of Radiation Oncology  Phone:  [phone] Fax:        [phone]   Name: Christopher Cook   DOB: [birth year]  MRN: [id number]    Date: 09/21/2011  Follow Up Visit Note  Diagnosis: T1N0 NSCLC of right middle lobe  Interval since last radiation: 16 months  Interval History: Christopher Cook presents today for routine followup.  He has no respiratory complaints. He lost his wife recently and is very sad about that. He is living at assisted living due to increasing falls. He had a CT of the chest earlier this week that shows no new nodules and that the treated lesion in the right middle lobe is responded well. He has no hemoptysis skin changes or weight loss. He had an appointment with Dr. Shirline Frees earlier today but missed that due to transportation issues.  Allergies: No Known Allergies  Medications:  Current Outpatient Prescriptions  Medication Sig Dispense Refill  . allopurinol (ZYLOPRIM) 300 MG tablet Take 300 mg by mouth every evening.       Marland Kitchen aspirin 81 MG tablet Take 81 mg by mouth daily.        . clopidogrel (PLAVIX) 75 MG tablet Take 75 mg by mouth every evening.       . divalproex (DEPAKOTE) 250 MG DR tablet Take 250 mg by mouth 2 (two) times daily.      Marland Kitchen escitalopram (LEXAPRO) 10 MG tablet Take 10 mg by mouth daily.      . meclizine (ANTIVERT) 50 MG tablet Take 25 mg by mouth 2 (two) times daily.      . Multiple Vitamin (MULITIVITAMIN WITH MINERALS) TABS Take 1 tablet by mouth daily.      . simvastatin (ZOCOR) 40 MG tablet Take 40 mg by mouth every evening.        Physical Exam:   oral temperature is 98.2 F (36.8 C). His blood pressure is 170/76 and his pulse is 70. His respiration is 20 and oxygen saturation is 97%.  No skin changes in the right lung. His lungs are clear to auscultation bilaterally.  IMPRESSION: Christopher Cook is a 78 y.o. male history of a left upper lobectomy for lung cancer and status post SBRT to a right middle lobe lesion with no evidence of  disease  PLAN: Rescan in followup in 6 months. I will let Dr. Arbutus Cook know I have seen him as well. I have asked that his followup appointments because to his daughter or son as he is in assisted living and they seem to have a difficult time getting him to appointments on time.    Lurline Hare, MD

## 2011-09-22 ENCOUNTER — Other Ambulatory Visit: Payer: Self-pay | Admitting: *Deleted

## 2011-09-23 ENCOUNTER — Telehealth: Payer: Self-pay | Admitting: Internal Medicine

## 2011-09-23 NOTE — Telephone Encounter (Signed)
lmonvm adviisng the pt that she has missed her appt on 09/21/2011 and that appt has been r/s to 09/26/2011@1 :45pm

## 2011-09-25 ENCOUNTER — Telehealth: Payer: Self-pay | Admitting: *Deleted

## 2011-09-25 NOTE — Telephone Encounter (Signed)
CALLED PATIENT TO INFORM OF APPT. FOR TEST AND FU VISIT, SPOKE WITH [place] AND GAVE HIM THE INFO. AND HE IS AWARE OF BOTH APPTS.

## 2011-09-25 NOTE — Telephone Encounter (Signed)
XXXX 

## 2011-09-26 ENCOUNTER — Ambulatory Visit: Payer: Medicare Other | Admitting: Internal Medicine

## 2011-11-11 ENCOUNTER — Emergency Department (HOSPITAL_COMMUNITY)
Admission: EM | Admit: 2011-11-11 | Discharge: 2011-11-11 | Disposition: A | Discharge status: Home / Self Care | Payer: Medicare Other | Attending: Emergency Medicine | Admitting: Emergency Medicine

## 2011-11-11 ENCOUNTER — Encounter (HOSPITAL_COMMUNITY): Payer: Self-pay | Admitting: Emergency Medicine

## 2011-11-11 DIAGNOSIS — Z7982 Long term (current) use of aspirin: Secondary | ICD-10-CM | POA: Insufficient documentation

## 2011-11-11 DIAGNOSIS — I2510 Atherosclerotic heart disease of native coronary artery without angina pectoris: Secondary | ICD-10-CM | POA: Insufficient documentation

## 2011-11-11 DIAGNOSIS — Z8659 Personal history of other mental and behavioral disorders: Secondary | ICD-10-CM | POA: Insufficient documentation

## 2011-11-11 DIAGNOSIS — M109 Gout, unspecified: Secondary | ICD-10-CM | POA: Insufficient documentation

## 2011-11-11 DIAGNOSIS — E162 Hypoglycemia, unspecified: Secondary | ICD-10-CM

## 2011-11-11 DIAGNOSIS — Z85118 Personal history of other malignant neoplasm of bronchus and lung: Secondary | ICD-10-CM | POA: Insufficient documentation

## 2011-11-11 DIAGNOSIS — Z79899 Other long term (current) drug therapy: Secondary | ICD-10-CM | POA: Insufficient documentation

## 2011-11-11 DIAGNOSIS — Z87891 Personal history of nicotine dependence: Secondary | ICD-10-CM | POA: Insufficient documentation

## 2011-11-11 DIAGNOSIS — I10 Essential (primary) hypertension: Secondary | ICD-10-CM | POA: Insufficient documentation

## 2011-11-11 DIAGNOSIS — Z8673 Personal history of transient ischemic attack (TIA), and cerebral infarction without residual deficits: Secondary | ICD-10-CM | POA: Insufficient documentation

## 2011-11-11 LAB — GLUCOSE, CAPILLARY: Glucose-Capillary: 76 mg/dL (ref 70–99)

## 2011-11-11 LAB — POCT I-STAT, CHEM 8
BUN: 14 mg/dL (ref 6–23)
Calcium, Ion: 1.2 mmol/L (ref 1.13–1.30)
Chloride: 103 mEq/L (ref 96–112)
Creatinine, Ser: 1.2 mg/dL (ref 0.50–1.35)
TCO2: 28 mmol/L (ref 0–100)

## 2011-11-11 NOTE — ED Provider Notes (Signed)
History     CSN: [id number]  Arrival date & time 11/11/11  0906   First MD Initiated Contact with Patient 11/11/11 [phone]      Chief Complaint  Patient presents with  . Hypoglycemia     The history is provided by the patient, the nursing home and the EMS personnel.   patient resides at an assisted living Center and he was noted to have stumbled and fell against a wall sliding down to the floor.  EMS states his blood sugar was 47.  The patient was given oral glucose and grape juice and was given through the emergency department.  He has a history of very early dementia and Parkinson's.  The patient reports his diet has been somewhat poor lately.  He denies nausea and vomiting.  He denies diarrhea.  I discussed this care with his son who reports that he's been eating well at the assisted living Center as of recently.  No chest pain or shortness of breath.  The patient states he feels fine at this time.  Past Medical History  Diagnosis Date  . Lung cancer dx'd 2006    surg only  . Hypertension   . Stroke   . Parkinson disease   . Coronary artery disease   . Gout   . Dementia   . Angina   . Shortness of breath   . TIA (transient ischemic attack)     Past Surgical History  Procedure Date  . Lasik   . Coronary angioplasty with stent placement   . Lobectomy     No family history on file.  History  Substance Use Topics  . Smoking status: Former Games developer  . Smokeless tobacco: Never Used  . Alcohol Use: No      Review of Systems  All other systems reviewed and are negative.    Allergies  Review of patient's allergies indicates no known allergies.  Home Medications   Current Outpatient Rx  Name  Route  Sig  Dispense  Refill  . ALLOPURINOL 300 MG PO TABS   Oral   Take 300 mg by mouth daily.          . ASPIRIN 81 MG PO TABS   Oral   Take 81 mg by mouth daily.           Marland Kitchen CLOPIDOGREL BISULFATE 75 MG PO TABS   Oral   Take 75 mg by mouth daily.          Marland Kitchen  DIVALPROEX SODIUM 250 MG PO TBEC   Oral   Take 250 mg by mouth 2 (two) times daily.         Marland Kitchen ESCITALOPRAM OXALATE 10 MG PO TABS   Oral   Take 10 mg by mouth daily.         Marland Kitchen MECLIZINE HCL 50 MG PO TABS   Oral   Take 25 mg by mouth 2 (two) times daily.         . ADULT MULTIVITAMIN W/MINERALS CH   Oral   Take 1 tablet by mouth daily.         Marland Kitchen SIMVASTATIN 40 MG PO TABS   Oral   Take 40 mg by mouth every evening.           BP 148/76  Pulse 69  Temp 98.7 F (37.1 C) (Oral)  Resp 20  SpO2 97%  Physical Exam  Nursing note and vitals reviewed. Constitutional: He is oriented to person, place, and time.  He appears well-developed and well-nourished.  HENT:  Head: Normocephalic and atraumatic.  Eyes: EOM are normal.  Neck: Normal range of motion.  Cardiovascular: Normal rate, regular rhythm, normal heart sounds and intact distal pulses.   Pulmonary/Chest: Effort normal and breath sounds normal. No respiratory distress.  Abdominal: Soft. He exhibits no distension. There is no tenderness.  Musculoskeletal: Normal range of motion.  Neurological: He is alert and oriented to person, place, and time.  Skin: Skin is warm and dry.  Psychiatric: He has a normal mood and affect. Judgment normal.    ED Course  Procedures (including critical care time)  Labs Reviewed  POCT I-STAT, CHEM 8 - Abnormal; Notable for the following:    Glucose, Bld 120 (*)     All other components within normal limits  GLUCOSE, CAPILLARY   No results found.   1. Hypoglycemia       MDM  Patient reports mild decreased oral intake.  He's been encouraged to eat and drink better.  I discussed his care with his son as well who understands the importance of appropriate diet .  Electrolytes and kidney function otherwise within normal limits.  Discharge home in good condition.  PCP followup.        Lyanne Co, MD 11/11/11 1128

## 2011-11-11 NOTE — ED Notes (Signed)
Per EMS-pt was walking to bathroom when he stumbled and fell against wall and slide down to floor. Pt from [facility]. EMS states CBG 47, given oral glucose and grape juice. Pt hx of dementia and Parkinson.

## 2011-11-11 NOTE — ED Notes (Signed)
ZOX:[id number]<AV> Expected date:11/11/11<BR> Expected time: 8:45 AM<BR> Means of arrival:Ambulance<BR> Comments:<BR> Fall/hit back of head/ No Loc/ No complaints

## 2011-11-18 ENCOUNTER — Emergency Department (HOSPITAL_COMMUNITY): Payer: Medicare Other

## 2011-11-18 ENCOUNTER — Observation Stay (HOSPITAL_COMMUNITY): Payer: Medicare Other

## 2011-11-18 ENCOUNTER — Encounter (HOSPITAL_COMMUNITY): Payer: Self-pay | Admitting: Emergency Medicine

## 2011-11-18 ENCOUNTER — Observation Stay (HOSPITAL_COMMUNITY)
Admission: EM | Admit: 2011-11-18 | Discharge: 2011-11-19 | Payer: Medicare Other | Attending: Internal Medicine | Admitting: Internal Medicine

## 2011-11-18 DIAGNOSIS — Z8673 Personal history of transient ischemic attack (TIA), and cerebral infarction without residual deficits: Secondary | ICD-10-CM | POA: Insufficient documentation

## 2011-11-18 DIAGNOSIS — I2510 Atherosclerotic heart disease of native coronary artery without angina pectoris: Secondary | ICD-10-CM | POA: Insufficient documentation

## 2011-11-18 DIAGNOSIS — R4182 Altered mental status, unspecified: Secondary | ICD-10-CM | POA: Insufficient documentation

## 2011-11-18 DIAGNOSIS — G459 Transient cerebral ischemic attack, unspecified: Principal | ICD-10-CM | POA: Insufficient documentation

## 2011-11-18 DIAGNOSIS — G20A1 Parkinson's disease without dyskinesia, without mention of fluctuations: Secondary | ICD-10-CM | POA: Insufficient documentation

## 2011-11-18 DIAGNOSIS — C3490 Malignant neoplasm of unspecified part of unspecified bronchus or lung: Secondary | ICD-10-CM

## 2011-11-18 DIAGNOSIS — M109 Gout, unspecified: Secondary | ICD-10-CM | POA: Insufficient documentation

## 2011-11-18 DIAGNOSIS — N189 Chronic kidney disease, unspecified: Secondary | ICD-10-CM | POA: Insufficient documentation

## 2011-11-18 DIAGNOSIS — Z85118 Personal history of other malignant neoplasm of bronchus and lung: Secondary | ICD-10-CM | POA: Insufficient documentation

## 2011-11-18 DIAGNOSIS — G20 Parkinson's disease: Secondary | ICD-10-CM | POA: Insufficient documentation

## 2011-11-18 DIAGNOSIS — R55 Syncope and collapse: Secondary | ICD-10-CM

## 2011-11-18 DIAGNOSIS — R29810 Facial weakness: Secondary | ICD-10-CM | POA: Insufficient documentation

## 2011-11-18 DIAGNOSIS — I129 Hypertensive chronic kidney disease with stage 1 through stage 4 chronic kidney disease, or unspecified chronic kidney disease: Secondary | ICD-10-CM | POA: Insufficient documentation

## 2011-11-18 DIAGNOSIS — F0390 Unspecified dementia without behavioral disturbance: Secondary | ICD-10-CM | POA: Insufficient documentation

## 2011-11-18 HISTORY — DX: Disorder of kidney and ureter, unspecified: N28.9

## 2011-11-18 LAB — POCT I-STAT, CHEM 8
Calcium, Ion: 1.2 mmol/L (ref 1.13–1.30)
Glucose, Bld: 95 mg/dL (ref 70–99)
HCT: 35 % — ABNORMAL LOW (ref 39.0–52.0)
Hemoglobin: 11.9 g/dL — ABNORMAL LOW (ref 13.0–17.0)
TCO2: 29 mmol/L (ref 0–100)

## 2011-11-18 LAB — COMPREHENSIVE METABOLIC PANEL
ALT: 7 U/L (ref 0–53)
Alkaline Phosphatase: 45 U/L (ref 39–117)
BUN: 29 mg/dL — ABNORMAL HIGH (ref 6–23)
CO2: 31 mEq/L (ref 19–32)
GFR calc Af Amer: 39 mL/min — ABNORMAL LOW (ref >90)
GFR calc non Af Amer: 34 mL/min — ABNORMAL LOW (ref >90)
Glucose, Bld: 98 mg/dL (ref 70–99)
Potassium: 4 mEq/L (ref 3.5–5.1)
Sodium: 140 mEq/L (ref 135–145)

## 2011-11-18 LAB — CBC
HCT: 37.1 % — ABNORMAL LOW (ref 39.0–52.0)
Hemoglobin: 11.3 g/dL — ABNORMAL LOW (ref 13.0–17.0)
Hemoglobin: 11.9 g/dL — ABNORMAL LOW (ref 13.0–17.0)
MCH: 28.4 pg (ref 26.0–34.0)
MCV: 88.9 fL (ref 78.0–100.0)
MCV: 88.3 fL (ref 78.0–100.0)
Platelets: 162 10*3/uL (ref 150–400)
RBC: 3.98 MIL/uL — ABNORMAL LOW (ref 4.22–5.81)
RBC: 4.2 MIL/uL — ABNORMAL LOW (ref 4.22–5.81)
RDW: 15.5 % (ref 11.5–15.5)
WBC: 3 10*3/uL — ABNORMAL LOW (ref 4.0–10.5)
WBC: 3.4 10*3/uL — ABNORMAL LOW (ref 4.0–10.5)

## 2011-11-18 LAB — DIFFERENTIAL
Eosinophils Absolute: 0.4 10*3/uL (ref 0.0–0.7)
Eosinophils Relative: 12 % — ABNORMAL HIGH (ref 0–5)
Lymphocytes Relative: 27 % (ref 12–46)
Lymphs Abs: 0.8 10*3/uL (ref 0.7–4.0)
Monocytes Relative: 14 % — ABNORMAL HIGH (ref 3–12)
Neutrophils Relative %: 47 % (ref 43–77)

## 2011-11-18 LAB — CREATININE, SERUM: GFR calc Af Amer: 49 mL/min — ABNORMAL LOW (ref >90)

## 2011-11-18 LAB — PROTIME-INR: Prothrombin Time: 13.6 seconds (ref 11.6–15.2)

## 2011-11-18 LAB — TROPONIN I: Troponin I: <0.3 ng/mL (ref <0.30)

## 2011-11-18 LAB — POCT I-STAT TROPONIN I

## 2011-11-18 LAB — ETHANOL: Alcohol, Ethyl (B): <11 mg/dL (ref 0–11)

## 2011-11-18 MED ORDER — SIMVASTATIN 40 MG PO TABS
40.0000 mg | ORAL_TABLET | Freq: Every day | ORAL | Status: DC
  Administered 2011-11-19: 40 mg via ORAL
  Filled 2011-11-18 (×2): qty 1

## 2011-11-18 MED ORDER — MECLIZINE HCL 25 MG PO TABS
25.0000 mg | ORAL_TABLET | Freq: Two times a day (BID) | ORAL | Status: DC
  Administered 2011-11-19 (×2): 25 mg via ORAL
  Filled 2011-11-18 (×3): qty 1

## 2011-11-18 MED ORDER — ADULT MULTIVITAMIN W/MINERALS CH
1.0000 | ORAL_TABLET | Freq: Every day | ORAL | Status: DC
  Administered 2011-11-19: 1 via ORAL
  Filled 2011-11-18: qty 1

## 2011-11-18 MED ORDER — ASPIRIN EC 81 MG PO TBEC
81.0000 mg | DELAYED_RELEASE_TABLET | Freq: Every morning | ORAL | Status: DC
  Administered 2011-11-19: 81 mg via ORAL
  Filled 2011-11-18: qty 1

## 2011-11-18 MED ORDER — ENOXAPARIN SODIUM 40 MG/0.4ML ~~LOC~~ SOLN
40.0000 mg | SUBCUTANEOUS | Status: DC
  Administered 2011-11-19: 40 mg via SUBCUTANEOUS
  Filled 2011-11-18 (×2): qty 0.4

## 2011-11-18 MED ORDER — SENNOSIDES-DOCUSATE SODIUM 8.6-50 MG PO TABS: 1.0000 | ORAL_TABLET | Freq: Every evening | ORAL | Status: DC | PRN

## 2011-11-18 MED ORDER — TAB-A-VITE PO TABS: 1.0000 | ORAL_TABLET | Freq: Every day | ORAL | Status: DC

## 2011-11-18 MED ORDER — ESCITALOPRAM OXALATE 10 MG PO TABS
10.0000 mg | ORAL_TABLET | Freq: Every morning | ORAL | Status: DC
  Administered 2011-11-19: 10 mg via ORAL
  Filled 2011-11-18: qty 1

## 2011-11-18 MED ORDER — LABETALOL HCL 5 MG/ML IV SOLN: 5.0000 mg | INTRAVENOUS | Status: DC | PRN

## 2011-11-18 MED ORDER — IPRATROPIUM BROMIDE 0.02 % IN SOLN: 0.5000 mg | Freq: Once | RESPIRATORY_TRACT | Status: DC

## 2011-11-18 MED ORDER — SODIUM CHLORIDE 0.9 % IV SOLN
INTRAVENOUS | Status: DC
  Administered 2011-11-19: 01:00:00 via INTRAVENOUS

## 2011-11-18 MED ORDER — CLOPIDOGREL BISULFATE 75 MG PO TABS
75.0000 mg | ORAL_TABLET | Freq: Every morning | ORAL | Status: DC
  Administered 2011-11-19: 75 mg via ORAL
  Filled 2011-11-18: qty 1

## 2011-11-18 MED ORDER — DIVALPROEX SODIUM 250 MG PO DR TAB
250.0000 mg | DELAYED_RELEASE_TABLET | Freq: Two times a day (BID) | ORAL | Status: DC
  Administered 2011-11-19 (×2): 250 mg via ORAL
  Filled 2011-11-18 (×3): qty 1

## 2011-11-18 MED ORDER — ALBUTEROL SULFATE (5 MG/ML) 0.5% IN NEBU: 5.0000 mg | INHALATION_SOLUTION | Freq: Once | RESPIRATORY_TRACT | Status: DC

## 2011-11-18 MED ORDER — ALLOPURINOL 300 MG PO TABS
300.0000 mg | ORAL_TABLET | Freq: Every morning | ORAL | Status: DC
  Administered 2011-11-19: 300 mg via ORAL
  Filled 2011-11-18: qty 1

## 2011-11-18 MED ORDER — ENOXAPARIN SODIUM 40 MG/0.4ML ~~LOC~~ SOLN: 40.0000 mg | SUBCUTANEOUS | Status: DC

## 2011-11-18 NOTE — ED Provider Notes (Signed)
History     CSN: [id number]  Arrival date & time 11/18/11  1132   First MD Initiated Contact with Patient 11/18/11 1145      Chief Complaint  Patient presents with  . Code Stroke  Code Stroke  (Consider location/radiation/quality/duration/timing/severity/associated sxs/prior treatment) HPI This 78 year old male lives at a care facility has Parkinson's and are mild dementia at baseline, he was eating breakfast this morning at 945 when he had a sudden witnessed episode of syncope and unresponsiveness for several minutes without coughing or choking or gagging, he apparently then woke up with some transient slurred speech and left facial droop that resolved prior to arrival to the ED, he did not have any pronator drift or weakness or numbness of his extremities according to EMS prior to arrival nor does he now, the patient has amnesia for the event, there was no trauma, he is no headache neck pain back pain chest pain shortness breath palpitations abdominal pain vertigo or other concerns. The patient feels baseline for himself. There is no treatment prior to arrival. A code stroke was called by EMS to the patient's slurred speech and minimal facial droop prior to arrival. Past Medical History  Diagnosis Date  . Lung cancer dx'd 2006    surg only  . Hypertension   . Stroke   . Parkinson disease   . Coronary artery disease   . Gout   . Dementia   . Angina   . Shortness of breath   . TIA (transient ischemic attack)   . Renal disorder Chronic Kidney disease    Past Surgical History  Procedure Date  . Lasik   . Coronary angioplasty with stent placement   . Lobectomy     No family history on file.  History  Substance Use Topics  . Smoking status: Former Games developer  . Smokeless tobacco: Never Used  . Alcohol Use: No      Review of Systems 10 Systems reviewed and are negative for acute change except as noted in the HPI. Allergies  Review of patient's allergies indicates no  known allergies.  Home Medications   No current outpatient prescriptions on file.  BP 189/78  Pulse 67  Temp 98.3 F (36.8 C) (Oral)  Resp 14  Ht 5\' 8"  (1.727 m)  Wt 155 lb (70.308 kg)  BMI 23.57 kg/m2  SpO2 100%  Physical Exam  Nursing note and vitals reviewed. Constitutional:       Awake, alert, nontoxic appearance with baseline speech for patient.  HENT:  Head: Atraumatic.  Mouth/Throat: No oropharyngeal exudate.  Eyes: EOM are normal. Pupils are equal, round, and reactive to light. Right eye exhibits no discharge. Left eye exhibits no discharge.  Neck: Neck supple.  Cardiovascular: Normal rate and regular rhythm.   No murmur heard. Pulmonary/Chest: Effort normal and breath sounds normal. No stridor. No respiratory distress. He has no wheezes. He has no rales. He exhibits no tenderness.  Abdominal: Soft. Bowel sounds are normal. He exhibits no mass. There is no tenderness. There is no rebound.  Musculoskeletal: He exhibits no tenderness.       Baseline ROM, moves extremities with no obvious new focal weakness.  Lymphadenopathy:    He has no cervical adenopathy.  Neurological: He is alert.       Awake, alert, cooperative and aware of situation; motor strength bilaterally; sensation normal to light touch bilaterally; peripheral visual fields full to confrontation; no facial asymmetry; tongue midline; major cranial nerves appear intact; no pronator drift,  normal finger to nose bilaterally  Skin: No rash noted.  Psychiatric: He has a normal mood and affect.    ED Course  Procedures (including critical care time)  ECG: Sinus rhythm, ventricular rate 63, left axis deviation, right bundle branch block, first degree AV block, left anterior fascicular block, no definite acute ischemic changes noted, no significant change noted compared with February 2013  tPA in stroke considered, but not given due to the following: Symptoms resolved 1200  1205: Pt seen by Neuro who recs  [place] coverage Obs (paged). 1340 Family states PCP is [facility] now.  Unassigned med paged. D/w [facility] for admit.    Labs Reviewed  CBC - Abnormal; Notable for the following:    WBC 3.0 (*)     RBC 3.98 (*)     Hemoglobin 11.3 (*)     HCT 35.4 (*)     All other components within normal limits  DIFFERENTIAL - Abnormal; Notable for the following:    Neutro Abs 1.4 (*)     Monocytes Relative 14 (*)     Eosinophils Relative 12 (*)     All other components within normal limits  COMPREHENSIVE METABOLIC PANEL - Abnormal; Notable for the following:    BUN 29 (*)     Creatinine, Ser 1.72 (*)     Albumin 3.0 (*)     GFR calc non Af Amer 34 (*)     GFR calc Af Amer 39 (*)     All other components within normal limits  POCT I-STAT, CHEM 8 - Abnormal; Notable for the following:    BUN 30 (*)     Creatinine, Ser 1.60 (*)     Hemoglobin 11.9 (*)     HCT 35.0 (*)     All other components within normal limits  CBC - Abnormal; Notable for the following:    WBC 3.4 (*)     RBC 4.20 (*)     Hemoglobin 11.9 (*)     HCT 37.1 (*)     Platelets 145 (*)     All other components within normal limits  CREATININE, SERUM - Abnormal; Notable for the following:    Creatinine, Ser 1.43 (*)     GFR calc non Af Amer 42 (*)     GFR calc Af Amer 49 (*)     All other components within normal limits  PROTIME-INR  APTT  TROPONIN I  ETHANOL  POCT I-STAT TROPONIN I  URINE RAPID DRUG SCREEN (HOSP PERFORMED)  URINALYSIS, ROUTINE W REFLEX MICROSCOPIC  HEMOGLOBIN A1C  LIPID PANEL   Dg Chest 2 View  11/18/2011  *RADIOLOGY REPORT*  Clinical Data: Stroke.  History of dementia.  CHEST - 2 VIEW  Comparison: Chest x-ray 01/30/2011.  Findings: Status post left upper lobectomy.  Extensive architectural distortion in the right perihilar region is unchanged, and presumably related to prior radiation therapy. Overall appearance is similar to prior examination 01/30/2011.  No definite acute consolidative airspace disease.   Blunting of the left costophrenic sulcus appears be chronic, and is unlikely to be related to a left pleural effusion or chronic left apical pleuroparenchymal thickening is unchanged and most compatible with mild scarring.  No evidence of pulmonary edema.  Heart size is normal.  Mediastinal contours are distorted by air similar to prior examination.  Atherosclerosis in the thoracic aorta.  IMPRESSION: 1.  Radiographic appearance of chest is very similar to prior examinations chronic changes, as above.  No definite radiographic evidence of  acute cardiopulmonary disease.   Original Report Authenticated By: Trudie Reed, M.D.    Ct Head (brain) Wo Contrast  11/18/2011  *RADIOLOGY REPORT*  Clinical Data: Code stroke, left facial droop, syncope  CT HEAD WITHOUT CONTRAST  Technique:  Contiguous axial images were obtained from the base of the skull through the vertex without contrast.  Comparison: 05/11/2011  Findings: No evidence of parenchymal hemorrhage or extra-axial fluid collection. No mass lesion, mass effect, or midline shift.  No CT evidence of acute infarction.  Encephalomalacic changes related to old left frontal infarct. Bilateral basal ganglia/thalamic lacunar infarcts.  Extensive subcortical white matter and periventricular small vessel ischemic changes.  Global cortical atrophy, out of proportion for age.  Stable ventriculomegaly.  The visualized paranasal sinuses are essentially clear. The mastoid air cells are unopacified.  No evidence of calvarial fracture.  IMPRESSION: No evidence of acute intracranial abnormality.  Prior infarcts with small vessel ischemic changes.  Atrophy with stable ventriculomegaly.  These results were called by telephone on 11/18/2011 at 1150 hours to Dr. Noel Christmas, who verbally acknowledged these results.   Original Report Authenticated By: Charline Bills, M.D.    Mr Brain Wo Contrast  11/18/2011  *RADIOLOGY REPORT*  Clinical Data:  Left facial droop.  Syncope.   Hypotension.  MRI BRAIN WITHOUT CONTRAST MRA HEAD WITHOUT CONTRAST  Technique: Multiplanar, multiecho pulse sequences of the brain and surrounding structures were obtained according to standard protocol without intravenous contrast.  Angiographic images of the head were obtained using MRA technique without contrast.  Comparison: 11/18/2011 CT.  01/31/2011 MR.  MRI HEAD  Findings:  Subacute small infarct posterior right corona radiata.  Remote left frontal lobe infarct with encephalomalacia.  Remote small infarcts caudate bilaterally, right pons and within the thalami.  Prominent small vessel disease type changes.  Global atrophy.  Ventricular prominence may be related to atrophy although difficult to exclude a mild component hydrocephalus. Appearance is unchanged.  No intracranial hemorrhage.  No intracranial mass lesion detected on this unenhanced exam.  Partially empty sella.  Paranasal sinus mucosal thickening.  IMPRESSION: Subacute small infarct posterior right corona radiata.  Please see above.  MRA HEAD  Findings: Mild narrowing supraclinoid aspect of the internal carotid artery bilaterally greater on the left.  Mild irregularity and narrowing involving the A1 segment of the anterior cerebral artery bilaterally.  No significant stenosis M1 segment middle cerebral artery.  Middle cerebral artery branch vessel irregularity bilaterally.  Left vertebral artery is poorly delineated along its distal aspect appearing narrowed in addition to ending in a PICA distribution.  Mild to moderate narrowing distal right vertebral artery.  Poor delineation of the PICAs and AICAs.  Mild irregularity of the basilar artery without high-grade stenosis.  Narrowing of portions of the superior cerebellar arteries and posterior cerebral arteries bilaterally.  No aneurysm noted.  IMPRESSION: Mild progression of intracranial atherosclerotic type changes. Atherosclerotic type changes are more notable involving the posterior circulation.   Please see above.   Original Report Authenticated By: Lacy Duverney, M.D.    Mr Mra Head/brain Wo Cm  11/18/2011  *RADIOLOGY REPORT*  Clinical Data:  Left facial droop.  Syncope.  Hypotension.  MRI BRAIN WITHOUT CONTRAST MRA HEAD WITHOUT CONTRAST  Technique: Multiplanar, multiecho pulse sequences of the brain and surrounding structures were obtained according to standard protocol without intravenous contrast.  Angiographic images of the head were obtained using MRA technique without contrast.  Comparison: 11/18/2011 CT.  01/31/2011 MR.  MRI HEAD  Findings:  Subacute small infarct  posterior right corona radiata.  Remote left frontal lobe infarct with encephalomalacia.  Remote small infarcts caudate bilaterally, right pons and within the thalami.  Prominent small vessel disease type changes.  Global atrophy.  Ventricular prominence may be related to atrophy although difficult to exclude a mild component hydrocephalus. Appearance is unchanged.  No intracranial hemorrhage.  No intracranial mass lesion detected on this unenhanced exam.  Partially empty sella.  Paranasal sinus mucosal thickening.  IMPRESSION: Subacute small infarct posterior right corona radiata.  Please see above.  MRA HEAD  Findings: Mild narrowing supraclinoid aspect of the internal carotid artery bilaterally greater on the left.  Mild irregularity and narrowing involving the A1 segment of the anterior cerebral artery bilaterally.  No significant stenosis M1 segment middle cerebral artery.  Middle cerebral artery branch vessel irregularity bilaterally.  Left vertebral artery is poorly delineated along its distal aspect appearing narrowed in addition to ending in a PICA distribution.  Mild to moderate narrowing distal right vertebral artery.  Poor delineation of the PICAs and AICAs.  Mild irregularity of the basilar artery without high-grade stenosis.  Narrowing of portions of the superior cerebellar arteries and posterior cerebral arteries  bilaterally.  No aneurysm noted.  IMPRESSION: Mild progression of intracranial atherosclerotic type changes. Atherosclerotic type changes are more notable involving the posterior circulation.  Please see above.   Original Report Authenticated By: Lacy Duverney, M.D.      1. TIA (transient ischemic attack)   2. Syncope   3. Malignant neoplasm of bronchus and lung, unspecified site       MDM  Pt stable in ED with no significant deterioration in condition.  Patient / Family / Caregiver informed of clinical course, understand medical decision-making process, and agree with plan.        Hurman Horn, MD 11/18/11 2139

## 2011-11-18 NOTE — ED Notes (Signed)
Patient transported to MRI 

## 2011-11-18 NOTE — Code Documentation (Signed)
Code stroke called at 1124, Patient arrived to [facility] via EMS at 1132, EDP seen at 1132, stroke team arrived at 1125, Patient in CT  At 88, lab arrived at 48.  LSN 0945, per EMS patient was finishing up eating in dining hall at [employer] and was witnessed to slump over and have LOC for 2-3 minutes, upon coming to patient developed slurred speech and droop.  Upon arrival to [facility] symptoms had resolved.  NIHSS 3

## 2011-11-18 NOTE — Consult Note (Signed)
Referring Physician: Dr.Bednar    Chief Complaint: Mental status changes with slurred speech and facial weakness.  HPI: Christopher Cook is an 78 y.o. male with a history of previous strokes, coronary artery disease, hypertension, lung cancer and Parkinson's disease, was brought to the emergency room from his long-term care facility following an episode of unresponsiveness for several minutes followed by facial weakness and slurred speech. No tonic or clonic activity was described. Patient has a history of dementia and is typically confused. CT scan of his head showed encephalomalacia involving the left frontal region as well as multiple areas of subcortical small vessel infarctions. There were no acute changes. Patient's facial weakness and slurred speech cleared prior to arriving in the emergency room. Because of his confusion his NIH stroke score was 3. Patient was not a candidate for intravenous thrombolytic therapy with TPA. He's been taking aspirin 81 mg per day, as well as Plavix 75 mg per day.  LSN: 9:45 AM today tPA Given: No: Rapid resolution of deficits MRankin: 2  Past Medical History  Diagnosis Date  . Lung cancer dx'd 2006    surg only  . Hypertension   . Stroke   . Parkinson disease   . Coronary artery disease   . Gout   . Dementia   . Angina   . Shortness of breath   . TIA (transient ischemic attack)   . Renal disorder Chronic Kidney disease    No family history on file.   Medications:  Prior to Admission:  Allopurinol 300 mg per day Aspirin 81 mg per day Plavix 75 mg per day Depakote 250 mg twice a day Lexapro 10 mg per day Meclizine 25 mg twice a day Multivitamin 1 per day Zocor 40 mg per day  Physical Examination: Blood pressure 156/75, pulse 72, resp. rate 13, SpO2 100.00%.  Neurologic Examination: Mental Status: Alert, oriented oriented to person and place but disoriented to time.  Speech fluent without evidence of aphasia. Able to follow commands fairly  well. Cranial Nerves: II-Visual fields were normal. III/IV/VI-Pupils were equal and reacted. Extraocular movements were full and conjugate.    V/VII-no facial numbness and no facial weakness. VIII-normal. X-normal speech and symmetrical palatal movement. XII-midline tongue extension Motor: 5/5 bilaterally with normal tone and bulk Sensory: Normal throughout. Deep Tendon Reflexes: 1+ and symmetric. Plantars: Mute bilaterally Cerebellar: Normal finger-to-nose testing.  Ct Head (brain) Wo Contrast  11/18/2011  *RADIOLOGY REPORT*  Clinical Data: Code stroke, left facial droop, syncope  CT HEAD WITHOUT CONTRAST  Technique:  Contiguous axial images were obtained from the base of the skull through the vertex without contrast.  Comparison: 05/11/2011  Findings: No evidence of parenchymal hemorrhage or extra-axial fluid collection. No mass lesion, mass effect, or midline shift.  No CT evidence of acute infarction.  Encephalomalacic changes related to old left frontal infarct. Bilateral basal ganglia/thalamic lacunar infarcts.  Extensive subcortical white matter and periventricular small vessel ischemic changes.  Global cortical atrophy, out of proportion for age.  Stable ventriculomegaly.  The visualized paranasal sinuses are essentially clear. The mastoid air cells are unopacified.  No evidence of calvarial fracture.  IMPRESSION: No evidence of acute intracranial abnormality.  Prior infarcts with small vessel ischemic changes.  Atrophy with stable ventriculomegaly.  These results were called by telephone on 11/18/2011 at 1150 hours to Dr. Noel Christmas, who verbally acknowledged these results.   Original Report Authenticated By: Charline Bills, M.D.     Assessment: 78 y.o. male etiology for period of unresponsiveness  is unclear. Spells when patient experienced a syncopal episode. However, partial seizure cannot be completely ruled out. Also, etiology for transient facial weakness and slurred speech is  unclear. TIA will need to be ruled out as well as possible recurrent small vessel subcortical stroke.  Stroke Risk Factors - hyperlipidemia  Plan: 1. HgbA1c, fasting lipid panel 2. MRI, MRA  of the brain without contrast 3. PT consult, OT consult, Speech consult 4. Echocardiogram 5. Carotid dopplers 6. Prophylactic therapy-Antiplatelet med: Continue aspirin and Plavix at current doses 7. Risk factor modification 8. Telemetry monitoring 9. EEG to rule out possible new onset seizure disorder  C.R. Roseanne Reno, MD [facility] [phone]  11/18/2011, 1:25 PM

## 2011-11-18 NOTE — H&P (Signed)
Hospital Admission Note Date: 11/18/2011  Patient name: Christopher Cook Medical record number: [id number] Date of birth: 24-Sep-1921 Age: 78 y.o. Gender: male PCP: Pola Corn, MD  Medical Service: Internal Medicine Teaching Service  Attending physician:  Dr. Dalphine Handing    1st Contact: Dr. Sherrine Maples   Pager: [phone] 2nd Contact: Dr. Bosie Clos   Pager: [phone] After 5 pm or weekends: 1st Contact:      Pager: [phone] 2nd Contact:      Pager: [phone]  Chief Complaint: Code stroke  History of Present Illness: 78yo male who lives at an assisted living facility 2/2 multiple falls, with Parkinson's disease, mild dementia, lung cancer (T1N0 NSCLC of right middle lobe), and previous TIAs, on ASA and Plavix presented to the ED after he was witnesses having a a syncopal event and was unresponsive for several minutes and had a left facial droop and slurred speech.  A code stoke was called in the ED but was canceled b/c his facial droop and slurred speech were determined to be present before this syncopal event. He denies any SOB, chest pain, headache, neck pain, dizziness, and says that he feels like his normal self.  Meds: Current Outpatient Rx  Name  Route  Sig  Dispense  Refill  . ALLOPURINOL 300 MG PO TABS   Oral   Take 300 mg by mouth every morning.          . ASPIRIN EC 81 MG PO TBEC   Oral   Take 81 mg by mouth every morning.         Marland Kitchen CLOPIDOGREL BISULFATE 75 MG PO TABS   Oral   Take 75 mg by mouth every morning.          Marland Kitchen DIVALPROEX SODIUM 250 MG PO TBEC   Oral   Take 250 mg by mouth 2 (two) times daily.         Marland Kitchen ESCITALOPRAM OXALATE 10 MG PO TABS   Oral   Take 10 mg by mouth every morning.          Marland Kitchen MECLIZINE HCL 50 MG PO TABS   Oral   Take 25 mg by mouth 2 (two) times daily.         . TAB-A-VITE PO   Oral   Take 1 tablet by mouth every morning.         Marland Kitchen SIMVASTATIN 40 MG PO TABS   Oral   Take 40 mg by mouth at bedtime.             Allergies: Allergies as of 11/18/2011  . (No Known Allergies)   Past Medical History  Diagnosis Date  . Lung cancer dx'd 2006    surg only  . Hypertension   . Stroke   . Parkinson disease   . Coronary artery disease   . Gout   . Dementia   . Angina   . Shortness of breath   . TIA (transient ischemic attack)   . Renal disorder Chronic Kidney disease   Past Surgical History  Procedure Date  . Lasik   . Coronary angioplasty with stent placement   . Lobectomy    No family history on file. History   Social History  . Marital Status: Widowed    Spouse Name: N/A    Number of Children: N/A  . Years of Education: N/A   Occupational History  . Not on file.   Social History Main Topics  . Smoking status: Former Games developer  . Smokeless  tobacco: Never Used  . Alcohol Use: No  . Drug Use: No  . Sexually Active: Not Currently   Other Topics Concern  . Not on file   Social History Narrative  . No narrative on file    Review of Systems:  Constitutional: Denies fever, chills, or appetite change.  HEENT: Denies vision or hearing loss, or neck pain.  Respiratory: Denies SOB, DOE Cardiovascular: Denies chest pain, palpitations or leg swelling.  Gastrointestinal: Denies nausea, vomiting, abdominal pain, diarrhea, constipation Genitourinary: Denies dysuria, urgency, frequency.  Musculoskeletal: Denies myalgias, back pain  Skin: Denies rash or wound.  Neurological: Denies dizziness, seizures, weakness, or headaches.  Psychiatric/Behavioral: Denies mood changes  Physical Exam: Blood pressure 169/67, pulse 63, temperature 98.7 F (37.1 C), resp. rate 16, SpO2 100.00%. General: Sitting up in bed, slightly leaning over to the left. Well-developed, and cooperative on examination.  Head: Normocephalic and atraumatic.  Eyes: Pupils equal, round, and reactive to light, EOMI, no injection and anicteric.  Mouth: Slight left sided droop, seen when smiling. Pharynx pink and moist,  no erythema or exudates, dentures in place.  Neck: Supple, full ROM, no thyromegaly Lungs: CTAB, normal respiratory effort, no accessory muscle use, no crackles, and no wheezes. Heart: Regular rate, regular rhythm, no murmur, no gallop, and no rub.  Abdomen: Soft, non-tender, non-distended, normal bowel sounds, no guarding, no rebound tenderness, no organomegaly.  Msk: No joint swelling, warmth, or erythema.  Extremities: 2+ radial and DP pulses bilaterally.  Neurologic: Alert & oriented to person and place, cranial nerves II-XII intact, strength normal in all extremities. Skin: Turgor normal and no rashes.  Psych: Slurred speech, some difficulty with memory.  Lab results: Basic Metabolic Panel:  Basename 11/18/11 1144 11/18/11 1134  NA 142 140  K 4.1 4.0  CL 103 104  CO2 -- 31  GLUCOSE 95 98  BUN 30* 29*  CREATININE 1.60* 1.72*  CALCIUM -- 9.0  MG -- --  PHOS -- --   Liver Function Tests:  Basename 11/18/11 1134  AST 19  ALT 7  ALKPHOS 45  BILITOT 0.4  PROT 6.9  ALBUMIN 3.0*   CBC:  Basename 11/18/11 1144 11/18/11 1134  WBC -- 3.0*  NEUTROABS -- 1.4*  HGB 11.9* 11.3*  HCT 35.0* 35.4*  MCV -- 88.9  PLT -- 162   Cardiac Enzymes:  Basename 11/18/11 1134  CKTOTAL --  CKMB --  CKMBINDEX --  TROPONINI <0.30   Coagulation:  Basename 11/18/11 1134  LABPROT 13.6  INR 1.05     Alcohol Level:  Basename 11/18/11 1146  ETH <11    Imaging results:  Ct Head (brain) Wo Contrast  11/18/2011  *RADIOLOGY REPORT*  Clinical Data: Code stroke, left facial droop, syncope  CT HEAD WITHOUT CONTRAST  Technique:  Contiguous axial images were obtained from the base of the skull through the vertex without contrast.  Comparison: 05/11/2011  Findings: No evidence of parenchymal hemorrhage or extra-axial fluid collection. No mass lesion, mass effect, or midline shift.  No CT evidence of acute infarction.  Encephalomalacic changes related to old left frontal infarct. Bilateral  basal ganglia/thalamic lacunar infarcts.  Extensive subcortical white matter and periventricular small vessel ischemic changes.  Global cortical atrophy, out of proportion for age.  Stable ventriculomegaly.  The visualized paranasal sinuses are essentially clear. The mastoid air cells are unopacified.  No evidence of calvarial fracture.  IMPRESSION: No evidence of acute intracranial abnormality.  Prior infarcts with small vessel ischemic changes.  Atrophy with stable  ventriculomegaly.  These results were called by telephone on 11/18/2011 at 1150 hours to Dr. Noel Christmas, who verbally acknowledged these results.   Original Report Authenticated By: Charline Bills, M.D.     Other results: EKG: Sinus rhythm with 1st degree V block, RBBB, and left anterior fascicular block. Unchanged from previous.  Assessment & Plan by Problem: 78yo male who lives at an assisted living facility 2/2 multiple falls, with Parkinson's disease, mild dementia, HLD, lung cancer (T1N0 NSCLC of right middle lobe), and previous TIAs, on ASA and Plavix presented to the ED after he was witnesses having a a syncopal event and was unresponsive for several minutes, thought to be a CVA.   1. TIA: Per medical records, pt with h/o TIAs. He is currently on ASA 81mg  daily  and Plavix 75mg  daily. With his dementia and slurred speech, unable to obtain a good PMH from the pt. A CT head showed encephalomalacia involving the left frontal region as well as multiple subcortical small vessel infarctions with no acute changes. Neurology was consulted in the ED, and felt that he was not a good TPA candidate due to his confusion and NIH score of 3. It was recommended to continue Plavix and ASA and obtain ECHO, carotid dopplers, and MRI, MRA of the brain w/o contrast, and obtaining a Hgb A1c and aq lipid panel- he does have a h/o HLD, but per last labs from 1/13 his cholesterol and TG are well controlled.  - Admit to tele bed - MRI/MRA brain - ASA  81mg  daily'- Plavix 75mg  daily - ECHO - Carotid dopplers - Hgb A1c  - Lipid panel - PT/OT/Speech - F/u with Neuro  2. HLD: Pt with h/o hyperlipidemia. He is on simvastatin at home. Last lipid panel in our system is from 1/13. Total cholesterol is 141, LDL 76, HDL 46, and TG 97. Simvastatin was continued on admission, and a lipid panel has been ordered.  3. Early Parkinson's/Dementia: Pt with h/o dementia. He is currently being treated with depakote and lexapro, which were continued on admission.  4. T1N0 NSCLC of right middle lobe: He is s/p left upper lobectomy and SBRT to a right middle lobe lesion with no evidence of disease, per Radiation Oncology note from September. A Ct chest was performed and showed no new nodules. Per RadOnc, plans to re-scan Mr. Junious within the next 6 months.  5. DVT PPx: [place] Lovenox    Dispo: Disposition is deferred at this time, awaiting improvement of current medical problems. Anticipated discharge in approximately 2 day(s).   The patient does have current PCP (SPRUILL,JEROME O, MD), therefore will be require OPC follow-up after discharge.   The patient might have transportation limitations that hinder transportation to clinic appointments.  Signed: Genelle Gather 11/18/2011, 4:52 PM

## 2011-11-18 NOTE — ED Notes (Signed)
Received pt from [facility] with c/o while eating pt was slumped over and unresponsive for 2-3 mins. Upon becoming responsive pt had slurred speech and facial droop. Symptoms resolved prior to arrival to ED.

## 2011-11-19 LAB — RAPID URINE DRUG SCREEN, HOSP PERFORMED
Amphetamines: NOT DETECTED
Barbiturates: NOT DETECTED
Benzodiazepines: NOT DETECTED
Cocaine: NOT DETECTED
Opiates: NOT DETECTED
Tetrahydrocannabinol: NOT DETECTED

## 2011-11-19 LAB — LIPID PANEL
Cholesterol: 173 mg/dL (ref 0–200)
HDL: 52 mg/dL (ref >39)
Total CHOL/HDL Ratio: 3.3 RATIO
Triglycerides: 68 mg/dL (ref <150)

## 2011-11-19 LAB — URINE MICROSCOPIC-ADD ON

## 2011-11-19 LAB — URINALYSIS, ROUTINE W REFLEX MICROSCOPIC
Glucose, UA: NEGATIVE mg/dL
Ketones, ur: NEGATIVE mg/dL
Nitrite: NEGATIVE
Protein, ur: NEGATIVE mg/dL
Urobilinogen, UA: 1 mg/dL (ref 0.0–1.0)

## 2011-11-19 MED ORDER — CHLORHEXIDINE GLUCONATE CLOTH 2 % EX PADS
6.0000 | MEDICATED_PAD | Freq: Every day | CUTANEOUS | Status: DC
  Administered 2011-11-19: 6 via TOPICAL

## 2011-11-19 MED ORDER — MUPIROCIN 2 % EX OINT
1.0000 "application " | TOPICAL_OINTMENT | Freq: Two times a day (BID) | CUTANEOUS | Status: DC
  Administered 2011-11-19: 1 via NASAL
  Filled 2011-11-19: qty 22

## 2011-11-19 NOTE — Progress Notes (Signed)
11/19/2011 Milana Kidney DPT PAGER: [phone] OFFICE: [phone]

## 2011-11-19 NOTE — Care Management Note (Signed)
    Page 1 of 1   11/19/2011     3:28:03 PM   CARE MANAGEMENT NOTE 11/19/2011  Patient:  Christopher Cook, Christopher Cook   Account Number:  [id number]  Date Initiated:  11/19/2011  Documentation initiated by:  Jacquelynn Cree  Subjective/Objective Assessment:   Admitted with TIA from an ALF.     Action/Plan:   Anticipated DC Date:  11/19/2011   Anticipated DC Plan:  ASSISTED LIVING / REST HOME  In-house referral  Clinical Social Worker      DC Planning Services  CM consult      Choice offered to / List presented to:             Status of service:  Completed, signed off Medicare Important Message given?   (If response is "NO", the following Medicare IM given date fields will be blank) Date Medicare IM given:   Date Additional Medicare IM given:    Discharge Disposition:  ASSISTED LIVING  Per UR Regulation:  Reviewed for med. necessity/level of care/duration of stay  If discussed at Long Length of Stay Meetings, dates discussed:    Comments:

## 2011-11-19 NOTE — Progress Notes (Addendum)
Subjective: Pt states that he is doing well this morning.  Objective: Vital signs in last 24 hours: Filed Vitals:   11/19/11 0030 11/19/11 0210 11/19/11 0415 11/19/11 0549  BP: 166/77 154/64 155/72 157/68  Pulse: 67 68 69 72  Temp: 98.3 F (36.8 C) 98.3 F (36.8 C) 98.2 F (36.8 C) 98.3 F (36.8 C)  TempSrc: Oral Oral Oral Oral  Resp: 15 14 15 14   Height:      Weight:      SpO2: 98% 100% 100% 100%   Weight change:   Intake/Output Summary (Last 24 hours) at 11/19/11 1004 Last data filed at 11/18/11 2100  Gross per 24 hour  Intake    120 ml  Output      0 ml  Net    120 ml   Vitals reviewed. General:Sitting up in bed, NAD HEENT: EOMI, no scleral icterus Cardiac: RRR Pulm: CTAB Abd: S, NT, ND, BS present Ext: Warm and well perfused, no pedal edema Neuro: Alert and oriented X2, cranial nerves II-XII grossly intact  Lab Results: Basic Metabolic Panel:  Lab 11/18/11 4098 11/18/11 1144 11/18/11 1134  NA -- 142 140  K -- 4.1 4.0  CL -- 103 104  CO2 -- -- 31  GLUCOSE -- 95 98  BUN -- 30* 29*  CREATININE 1.43* 1.60* --  CALCIUM -- -- 9.0  MG -- -- --  PHOS -- -- --   Liver Function Tests:  Lab 11/18/11 1134  AST 19  ALT 7  ALKPHOS 45  BILITOT 0.4  PROT 6.9  ALBUMIN 3.0*   CBC:  Lab 11/18/11 1951 11/18/11 1144 11/18/11 1134  WBC 3.4* -- 3.0*  NEUTROABS -- -- 1.4*  HGB 11.9* 11.9* --  HCT 37.1* 35.0* --  MCV 88.3 -- 88.9  PLT 145* -- 162   Cardiac Enzymes:  Lab 11/18/11 1134  CKTOTAL --  CKMB --  CKMBINDEX --  TROPONINI <0.30   Fasting Lipid Panel:  Lab 11/19/11 0630  CHOL 173  HDL 52  LDLCALC 107*  TRIG 68  CHOLHDL 3.3  LDLDIRECT --   Coagulation:  Lab 11/18/11 1134  LABPROT 13.6  INR 1.05   Urine Drug Screen: Drugs of Abuse     Component Value Date/Time   LABOPIA NONE DETECTED 11/19/2011 0219   COCAINSCRNUR NONE DETECTED 11/19/2011 0219   LABBENZ NONE DETECTED 11/19/2011 0219   AMPHETMU NONE DETECTED 11/19/2011 0219   THCU  NONE DETECTED 11/19/2011 0219   LABBARB NONE DETECTED 11/19/2011 0219    Alcohol Level:  Lab 11/18/11 1146  ETH <11   Urinalysis:  Lab 11/19/11 0219  COLORURINE YELLOW  LABSPEC 1.021  PHURINE 6.0  GLUCOSEU NEGATIVE  HGBUR SMALL*  BILIRUBINUR NEGATIVE  KETONESUR NEGATIVE  PROTEINUR NEGATIVE  UROBILINOGEN 1.0  NITRITE NEGATIVE  LEUKOCYTESUR TRACE*     Micro Results: Recent Results (from the past 240 hour(s))  MRSA PCR SCREENING     Status: Abnormal   Collection Time   11/19/11  3:54 AM      Component Value Range Status Comment   MRSA by PCR POSITIVE (*) NEGATIVE Final    Studies/Results: Dg Chest 2 View  11/18/2011  *RADIOLOGY REPORT*  Clinical Data: Stroke.  History of dementia.  CHEST - 2 VIEW  Comparison: Chest x-ray 01/30/2011.  Findings: Status post left upper lobectomy.  Extensive architectural distortion in the right perihilar region is unchanged, and presumably related to prior radiation therapy. Overall appearance is similar to prior examination 01/30/2011.  No  definite acute consolidative airspace disease.  Blunting of the left costophrenic sulcus appears be chronic, and is unlikely to be related to a left pleural effusion or chronic left apical pleuroparenchymal thickening is unchanged and most compatible with mild scarring.  No evidence of pulmonary edema.  Heart size is normal.  Mediastinal contours are distorted by air similar to prior examination.  Atherosclerosis in the thoracic aorta.  IMPRESSION: 1.  Radiographic appearance of chest is very similar to prior examinations chronic changes, as above.  No definite radiographic evidence of acute cardiopulmonary disease.   Original Report Authenticated By: Trudie Reed, M.D.    Ct Head (brain) Wo Contrast  11/18/2011  *RADIOLOGY REPORT*  Clinical Data: Code stroke, left facial droop, syncope  CT HEAD WITHOUT CONTRAST  Technique:  Contiguous axial images were obtained from the base of the skull through the vertex  without contrast.  Comparison: 05/11/2011  Findings: No evidence of parenchymal hemorrhage or extra-axial fluid collection. No mass lesion, mass effect, or midline shift.  No CT evidence of acute infarction.  Encephalomalacic changes related to old left frontal infarct. Bilateral basal ganglia/thalamic lacunar infarcts.  Extensive subcortical white matter and periventricular small vessel ischemic changes.  Global cortical atrophy, out of proportion for age.  Stable ventriculomegaly.  The visualized paranasal sinuses are essentially clear. The mastoid air cells are unopacified.  No evidence of calvarial fracture.  IMPRESSION: No evidence of acute intracranial abnormality.  Prior infarcts with small vessel ischemic changes.  Atrophy with stable ventriculomegaly.  These results were called by telephone on 11/18/2011 at 1150 hours to Dr. Noel Christmas, who verbally acknowledged these results.   Original Report Authenticated By: Charline Bills, M.D.    Mr Brain Wo Contrast  11/18/2011  *RADIOLOGY REPORT*  Clinical Data:  Left facial droop.  Syncope.  Hypotension.  MRI BRAIN WITHOUT CONTRAST MRA HEAD WITHOUT CONTRAST  Technique: Multiplanar, multiecho pulse sequences of the brain and surrounding structures were obtained according to standard protocol without intravenous contrast.  Angiographic images of the head were obtained using MRA technique without contrast.  Comparison: 11/18/2011 CT.  01/31/2011 MR.  MRI HEAD  Findings:  Subacute small infarct posterior right corona radiata.  Remote left frontal lobe infarct with encephalomalacia.  Remote small infarcts caudate bilaterally, right pons and within the thalami.  Prominent small vessel disease type changes.  Global atrophy.  Ventricular prominence may be related to atrophy although difficult to exclude a mild component hydrocephalus. Appearance is unchanged.  No intracranial hemorrhage.  No intracranial mass lesion detected on this unenhanced exam.  Partially  empty sella.  Paranasal sinus mucosal thickening.  IMPRESSION: Subacute small infarct posterior right corona radiata.  Please see above.  MRA HEAD  Findings: Mild narrowing supraclinoid aspect of the internal carotid artery bilaterally greater on the left.  Mild irregularity and narrowing involving the A1 segment of the anterior cerebral artery bilaterally.  No significant stenosis M1 segment middle cerebral artery.  Middle cerebral artery branch vessel irregularity bilaterally.  Left vertebral artery is poorly delineated along its distal aspect appearing narrowed in addition to ending in a PICA distribution.  Mild to moderate narrowing distal right vertebral artery.  Poor delineation of the PICAs and AICAs.  Mild irregularity of the basilar artery without high-grade stenosis.  Narrowing of portions of the superior cerebellar arteries and posterior cerebral arteries bilaterally.  No aneurysm noted.  IMPRESSION: Mild progression of intracranial atherosclerotic type changes. Atherosclerotic type changes are more notable involving the posterior circulation.  Please see above.  Original Report Authenticated By: Lacy Duverney, M.D.    Mr Mra Head/brain Wo Cm  11/18/2011  *RADIOLOGY REPORT*  Clinical Data:  Left facial droop.  Syncope.  Hypotension.  MRI BRAIN WITHOUT CONTRAST MRA HEAD WITHOUT CONTRAST  Technique: Multiplanar, multiecho pulse sequences of the brain and surrounding structures were obtained according to standard protocol without intravenous contrast.  Angiographic images of the head were obtained using MRA technique without contrast.  Comparison: 11/18/2011 CT.  01/31/2011 MR.  MRI HEAD  Findings:  Subacute small infarct posterior right corona radiata.  Remote left frontal lobe infarct with encephalomalacia.  Remote small infarcts caudate bilaterally, right pons and within the thalami.  Prominent small vessel disease type changes.  Global atrophy.  Ventricular prominence may be related to atrophy  although difficult to exclude a mild component hydrocephalus. Appearance is unchanged.  No intracranial hemorrhage.  No intracranial mass lesion detected on this unenhanced exam.  Partially empty sella.  Paranasal sinus mucosal thickening.  IMPRESSION: Subacute small infarct posterior right corona radiata.  Please see above.  MRA HEAD  Findings: Mild narrowing supraclinoid aspect of the internal carotid artery bilaterally greater on the left.  Mild irregularity and narrowing involving the A1 segment of the anterior cerebral artery bilaterally.  No significant stenosis M1 segment middle cerebral artery.  Middle cerebral artery branch vessel irregularity bilaterally.  Left vertebral artery is poorly delineated along its distal aspect appearing narrowed in addition to ending in a PICA distribution.  Mild to moderate narrowing distal right vertebral artery.  Poor delineation of the PICAs and AICAs.  Mild irregularity of the basilar artery without high-grade stenosis.  Narrowing of portions of the superior cerebellar arteries and posterior cerebral arteries bilaterally.  No aneurysm noted.  IMPRESSION: Mild progression of intracranial atherosclerotic type changes. Atherosclerotic type changes are more notable involving the posterior circulation.  Please see above.   Original Report Authenticated By: Lacy Duverney, M.D.    Medications: I have reviewed the patient's current medications. Scheduled Meds:   . allopurinol  300 mg Oral q morning - 10a  . aspirin EC  81 mg Oral q morning - 10a  . Chlorhexidine Gluconate Cloth  6 each Topical Q0600  . clopidogrel  75 mg Oral q morning - 10a  . divalproex  250 mg Oral BID  . enoxaparin  40 mg Subcutaneous Q24H  . escitalopram  10 mg Oral q morning - 10a  . meclizine  25 mg Oral BID  . multivitamin with minerals  1 tablet Oral Daily  . mupirocin ointment  1 application Nasal BID  . simvastatin  40 mg Oral QHS  . [DISCONTINUED] albuterol  5 mg Nebulization Once  .  [DISCONTINUED] enoxaparin  40 mg Subcutaneous Q24H  . [DISCONTINUED] ipratropium  0.5 mg Nebulization Once  . [DISCONTINUED] Tab-A-Vite  1 tablet Oral Daily   Continuous Infusions:   . sodium chloride 75 mL/hr at 11/19/11 0048   PRN Meds:.labetalol, senna-docusate, [DISCONTINUED] senna-docusate  Assessment/Plan: 78yo male who lives at an assisted living facility 2/2 multiple falls, with Parkinson's disease, mild dementia, HLD, lung cancer (T1N0 NSCLC of right middle lobe), and previous TIAs, on ASA and Plavix presented to the ED after he was witnesses having a a syncopal event and was unresponsive for several minutes, thought to be a CVA.   1. TIA: Per medical records, pt with h/o TIAs. He is currently on ASA 81mg  daily and Plavix 75mg  daily. With his dementia and slurred speech, unable to obtain a good PMH  from the pt. A CT head showed encephalomalacia involving the left frontal region as well as multiple subcortical small vessel infarctions with no acute changes. Neurology was consulted in the ED, and felt that he was not a good TPA candidate due to his confusion and NIH score of 3. It was recommended to continue Plavix and ASA and MRI, MRA of the brain w/o contrast, which showed subacute small infarct posterior right corona radiata, global atrophy, multiple remote infarcts, and mild progression of intracranial atherosclerotic type changes more notable involving the posterior circulation. ECHO and carotid dopplers were ordered but were not performed; this will need to be done as an outpatient if warranted.  - ASA 81mg  daily - Plavix 75mg  daily  - PT/OT/Speech  - F/u with Neuro   2. HLD: Pt with h/o hyperlipidemia. He is on simvastatin at home. Last lipid panel from 1/13 with total cholesterol of141, LDL 76, HDL 46, and TG 97. Simvastatin was continued on admission. Repeat lipid panel with elevated LDL to 107, but this was likely not fasting. - Simvastatin 40mg  qhs  3. Early  Parkinson's/Dementia: Pt with h/o dementia. He is currently being treated with depakote and lexapro, which were continued on admission.   4. T1N0 NSCLC of right middle lobe: He is s/p left upper lobectomy and SBRT to a right middle lobe lesion with no evidence of disease, per Radiation Oncology note from September. A Ct chest was performed and showed no new nodules. Per RadOnc, plans to re-scan Mr. Potempa within the next 6 months.   5. DVT PPx: [place] Lovenox  6. Dispo: D/c back to [facility] today and f/u with PCP   Dispo: Disposition is deferred at this time, awaiting improvement of current medical problems.  Anticipated discharge in approximately 1 day(s).   The patient does have current PCP (SPRUILL,JEROME O, MD), therefore is require OPC follow-up after discharge.   The patient does not know have transportation limitations that hinder transportation to clinic appointments.  .Services Needed at time of discharge: Y = Yes, Blank = No PT:   OT:   RN:   Equipment:   Other:     LOS: 1 day   Genelle Gather 11/19/2011, 10:04 AM

## 2011-11-19 NOTE — Progress Notes (Signed)
*  PRELIMINARY RESULTS* Vascular Ultrasound Carotid Duplex (Doppler) has been completed.  Preliminary findings: Bilateral:  No evidence of hemodynamically significant internal carotid artery stenosis.   Vertebral artery flow is antegrade.      Farrel Demark, RDMS, RVT 11/19/2011, 11:18 AM

## 2011-11-19 NOTE — Discharge Summary (Signed)
Internal Medicine Teaching Service Attending Note Date: 11/19/2011  Patient name: Christopher Cook  Medical record number: [id number]  Date of birth: 02-06-1921   I have seen the patient today and I agree with the discharge plan.    Thanks Aletta Edouard 11/19/2011, 2:23 PM

## 2011-11-19 NOTE — Progress Notes (Signed)
PT/OT Cancellation Note  Patient Details Name: Christopher Cook MRN: [id number] DOB: 08-12-1921   Cancelled Treatment:    Reason Eval/Treat Not Completed: Medical issues which prohibited therapy (bedrest). Will re-attempt when appropriate.   11/19/2011 Cipriano Mile OTR/L Pager [phone] Office [phone]

## 2011-11-19 NOTE — H&P (Signed)
Internal Medicine Teaching Service Attending Note Date: 11/19/2011  Patient name: Christopher Cook  Medical record number: [id number]  Date of birth: 1921-05-11   Chief Complaint: Stroke . History of Present Illness The patient, Christopher Cook, is a 78 y.o. year old male who comes in with the chief complaint of having had a stroke and being unresponsive for several minutes. He does have a PMH of stroke in the past as well as Parkinson's disease. The patient does not remember anything, and seems to suffer from mild dementia. I have the sequence of events from my resident team. The left sided droop that prompted the stroke apparently is old. He did not have any weakness in any extremity. There was no report of seizure like activity or incontinence. His speech was reported to be slurred but it was completely fine when I talked to him.     Past Medical History   has a past medical history of Lung cancer (dx'd 2006); Hypertension; Stroke; Parkinson disease; Coronary artery disease; Gout; Dementia; Angina; Shortness of breath; TIA (transient ischemic attack); and Renal disorder (Chronic Kidney disease).  Medications  Reviewed  Family History family history is not on file.  Social History  reports that he has quit smoking. He has never used smokeless tobacco. He reports that he does not drink alcohol or use illicit drugs.  Review of Systems The patient denies being in pain of any kind. He cannot give a good clear history possibly because of dementia.  Vital Signs: Filed Vitals:   11/19/11 0549  BP: 157/68  Pulse: 72  Temp: 98.3 F (36.8 C)  Resp: 14    Physical Exam: General: Vital signs reviewed and noted. Well-developed, well-nourished, in no acute distress; alert, appropriate and cooperative throughout examination.  HEENT: Normocephalic, atraumatic.   PERRLA, EOMI, No signs of anemia or jaundice.   Nares clear without discharge or hypertrophy of turbinates   Moist mucus membranes,  Oropharynx nonerythematous, no exudate appreciated.   Neck: No deformities, masses, or tenderness noted.Supple, No carotid Bruits, no JVD.  Lungs:  Normal respiratory effort. Clear to auscultation BL without crackles or wheezes from apices to bases.  Heart: RRR. S1 and S2 normal without gallop, mid diastolic murmur  Abdomen:  BS normoactive. Soft, Nondistended, non-tender.  No masses or organomegaly.appreciated  Extremities: No pretibial edema, distal pulses intact bilaterally  Neurologic: A&O X3, mild left facial droop, right side of buccal musculature face weak Other cranial nerves intact. Motor strength is 5/5 in all extremities, Sensations intact to light touch, Cerebellar signs negative.  Skin: No visible rashes or erythema   Lab results: CMP     Component Value Date/Time   NA 142 11/18/2011 1144   NA 135 03/14/2010 1022   K 4.1 11/18/2011 1144   K 4.3 03/14/2010 1022   CL 103 11/18/2011 1144   CL 98 03/14/2010 1022   CO2 31 11/18/2011 1134   CO2 29 03/14/2010 1022   GLUCOSE 95 11/18/2011 1144   GLUCOSE 107 03/14/2010 1022   BUN 30* 11/18/2011 1144   BUN 16 03/14/2010 1022   CREATININE 1.43* 11/18/2011 1951   CREATININE 1.7* 03/14/2010 1022   CALCIUM 9.0 11/18/2011 1134   CALCIUM 9.5 03/14/2010 1022   PROT 6.9 11/18/2011 1134   PROT 7.5 03/14/2010 1022   ALBUMIN 3.0* 11/18/2011 1134   AST 19 11/18/2011 1134   AST 23 03/14/2010 1022   ALT 7 11/18/2011 1134   ALKPHOS 45 11/18/2011 1134   ALKPHOS 46 03/14/2010 1022  BILITOT 0.4 11/18/2011 1134   BILITOT 0.70 03/14/2010 1022   GFRNONAA 42* 11/18/2011 1951   GFRAA 49* 11/18/2011 1951   CBC    Component Value Date/Time   WBC 3.4* 11/18/2011 1951   WBC 2.4* 12/22/2010 0919   RBC 4.20* 11/18/2011 1951   RBC 4.50 12/22/2010 0919   HGB 11.9* 11/18/2011 1951   HGB 12.5* 12/22/2010 0919   HCT 37.1* 11/18/2011 1951   HCT 39.3 12/22/2010 0919   PLT 145* 11/18/2011 1951   PLT 156 12/22/2010 0919   MCV 88.3 11/18/2011 1951   MCV  87.3 12/22/2010 0919   MCH 28.3 11/18/2011 1951   MCH 27.8 12/22/2010 0919   MCHC 32.1 11/18/2011 1951   MCHC 31.8* 12/22/2010 0919   RDW 15.5 11/18/2011 1951   RDW 15.7* 12/22/2010 0919   LYMPHSABS 0.8 11/18/2011 1134   LYMPHSABS 0.7* 12/22/2010 0919   MONOABS 0.4 11/18/2011 1134   MONOABS 0.4 12/22/2010 0919   EOSABS 0.4 11/18/2011 1134   EOSABS 0.3 12/22/2010 0919   BASOSABS 0.0 11/18/2011 1134   BASOSABS 0.0 12/22/2010 0919    Imaging results:   CT Head: No evidence of acute intracranial abnormality.  Prior infarcts with small vessel ischemic changes.  Atrophy with stable ventriculomegaly.   MRI Head WO contrast: Subacute small infarct posterior right corona radiata.  MRA Head: Mild progression of intracranial atherosclerotic type changes.  Atherosclerotic type changes are more notable involving the  posterior circulation  ECHO done, not read.   Carotid Doppler: No significant extracranial carotid artery stenosis demonstrated. Vertebrals are patent with antegrade flow   Assessment and Plan:  SUSPECTED TIA: Given the past medical history of stroke and CAD, this gentleman could have had a TIA but the details and history are sketchy. His facial assymetry is mild and present from previous stroke. He does not have significant neurological deficit and it is hard to distinguish past deficits from new ones. The slurred speech and confusion could very well be a part of Parkinson's disease. The imaging studies did not reveal anything new and significant. However, we will give this the benefit of doubt and follow this up as TIA and do the needful.Neurology consult has already been done.   I agree with the assessment and plan of my team on other chronic issues.   Thanks, Aletta Edouard, MD 11/17/20131:59 PM

## 2011-11-19 NOTE — Discharge Summary (Signed)
Internal Medicine Teaching [facility] Discharge Note  Name: Christopher Cook MRN: [id number] DOB: 1921/08/29 78 y.o.  Date of Admission: 11/18/2011 11:36 AM Date of Discharge: 11/19/2011 Attending Physician: Jonah Blue, DO  Discharge Diagnosis: Principal Problem:  *TIA (transient ischemic attack) Active Problems:  Altered mental status   Discharge Medications:   Medication List     As of 11/19/2011 12:21 PM    TAKE these medications         allopurinol 300 MG tablet   Commonly known as: ZYLOPRIM   Take 300 mg by mouth every morning.      aspirin EC 81 MG tablet   Take 81 mg by mouth every morning.      clopidogrel 75 MG tablet   Commonly known as: PLAVIX   Take 75 mg by mouth every morning.      divalproex 250 MG DR tablet   Commonly known as: DEPAKOTE   Take 250 mg by mouth 2 (two) times daily.      escitalopram 10 MG tablet   Commonly known as: LEXAPRO   Take 10 mg by mouth every morning.      meclizine 50 MG tablet   Commonly known as: ANTIVERT   Take 25 mg by mouth 2 (two) times daily.      simvastatin 40 MG tablet   Commonly known as: ZOCOR   Take 40 mg by mouth at bedtime.      TAB-A-VITE PO   Take 1 tablet by mouth every morning.          Disposition and follow-up:   [name]. At his follow up appointment, please evaluate his blood pressure.    Follow-up Appointments: Follow-up Information    Schedule an appointment as soon as possible for a visit with Pola Corn, MD. (For your hospital follow up after a TIA)    Contact information:   [address] [place] [address] [phone]         Discharge Orders    Future Appointments: Provider: Department: Dept Phone: Center:   03/20/2012 12:00 PM [facility] [phone]    03/21/2012 1:00 PM Lurline Hare, MD [facility] RAD[facility]ATION  ONCOLOGY [phone] None     Future Orders Please Complete By Expires   Diet - low sodium heart healthy      Increase activity slowly      Call MD for:  persistant dizziness or light-headedness      Call MD for:  severe uncontrolled pain      Call MD for:  persistant nausea and vomiting      Call MD for:  extreme fatigue         Consultations:  Neurology  Procedures Performed:  Dg Chest 2 View  11/18/2011  *RADIOLOGY REPORT*  Clinical Data: Stroke.  History of dementia.  CHEST - 2 VIEW  Comparison: Chest x-ray 01/30/2011.  Findings: Status post left upper lobectomy.  Extensive architectural distortion in the right perihilar region is unchanged, and presumably related to prior radiation therapy. Overall appearance is similar to prior examination 01/30/2011.  No definite acute consolidative airspace disease.  Blunting of the left costophrenic sulcus appears be chronic, and is unlikely to be related to a left pleural effusion or chronic left apical pleuroparenchymal thickening is unchanged and most compatible with mild scarring.  No evidence of pulmonary edema.  Heart size is normal.  Mediastinal contours  are distorted by air similar to prior examination.  Atherosclerosis in the thoracic aorta.  IMPRESSION: 1.  Radiographic appearance of chest is very similar to prior examinations chronic changes, as above.  No definite radiographic evidence of acute cardiopulmonary disease.   Original Report Authenticated By: Trudie Reed, M.D.    Ct Head (brain) Wo Contrast  11/18/2011  *RADIOLOGY REPORT*  Clinical Data: Code stroke, left facial droop, syncope  CT HEAD WITHOUT CONTRAST  Technique:  Contiguous axial images were obtained from the base of the skull through the vertex without contrast.  Comparison: 05/11/2011  Findings: No evidence of parenchymal hemorrhage or extra-axial fluid collection. No mass lesion, mass effect, or midline shift.  No CT evidence of acute infarction.  Encephalomalacic  changes related to old left frontal infarct. Bilateral basal ganglia/thalamic lacunar infarcts.  Extensive subcortical white matter and periventricular small vessel ischemic changes.  Global cortical atrophy, out of proportion for age.  Stable ventriculomegaly.  The visualized paranasal sinuses are essentially clear. The mastoid air cells are unopacified.  No evidence of calvarial fracture.  IMPRESSION: No evidence of acute intracranial abnormality.  Prior infarcts with small vessel ischemic changes.  Atrophy with stable ventriculomegaly.  These results were called by telephone on 11/18/2011 at 1150 hours to Dr. Noel Christmas, who verbally acknowledged these results.   Original Report Authenticated By: Charline Bills, M.D.    Mr Brain Wo Contrast  11/18/2011  *RADIOLOGY REPORT*  Clinical Data:  Left facial droop.  Syncope.  Hypotension.  MRI BRAIN WITHOUT CONTRAST MRA HEAD WITHOUT CONTRAST  Technique: Multiplanar, multiecho pulse sequences of the brain and surrounding structures were obtained according to standard protocol without intravenous contrast.  Angiographic images of the head were obtained using MRA technique without contrast.  Comparison: 11/18/2011 CT.  01/31/2011 MR.  MRI HEAD  Findings:  Subacute small infarct posterior right corona radiata.  Remote left frontal lobe infarct with encephalomalacia.  Remote small infarcts caudate bilaterally, right pons and within the thalami.  Prominent small vessel disease type changes.  Global atrophy.  Ventricular prominence may be related to atrophy although difficult to exclude a mild component hydrocephalus. Appearance is unchanged.  No intracranial hemorrhage.  No intracranial mass lesion detected on this unenhanced exam.  Partially empty sella.  Paranasal sinus mucosal thickening.  IMPRESSION: Subacute small infarct posterior right corona radiata.  Please see above.  MRA HEAD  Findings: Mild narrowing supraclinoid aspect of the internal carotid artery  bilaterally greater on the left.  Mild irregularity and narrowing involving the A1 segment of the anterior cerebral artery bilaterally.  No significant stenosis M1 segment middle cerebral artery.  Middle cerebral artery branch vessel irregularity bilaterally.  Left vertebral artery is poorly delineated along its distal aspect appearing narrowed in addition to ending in a PICA distribution.  Mild to moderate narrowing distal right vertebral artery.  Poor delineation of the PICAs and AICAs.  Mild irregularity of the basilar artery without high-grade stenosis.  Narrowing of portions of the superior cerebellar arteries and posterior cerebral arteries bilaterally.  No aneurysm noted.  IMPRESSION: Mild progression of intracranial atherosclerotic type changes. Atherosclerotic type changes are more notable involving the posterior circulation.  Please see above.   Original Report Authenticated By: Lacy Duverney, M.D.    Mr Mra Head/brain Wo Cm  11/18/2011  *RADIOLOGY REPORT*  Clinical Data:  Left facial droop.  Syncope.  Hypotension.  MRI BRAIN WITHOUT CONTRAST MRA HEAD WITHOUT CONTRAST  Technique: Multiplanar, multiecho pulse sequences of the brain and surrounding structures  were obtained according to standard protocol without intravenous contrast.  Angiographic images of the head were obtained using MRA technique without contrast.  Comparison: 11/18/2011 CT.  01/31/2011 MR.  MRI HEAD  Findings:  Subacute small infarct posterior right corona radiata.  Remote left frontal lobe infarct with encephalomalacia.  Remote small infarcts caudate bilaterally, right pons and within the thalami.  Prominent small vessel disease type changes.  Global atrophy.  Ventricular prominence may be related to atrophy although difficult to exclude a mild component hydrocephalus. Appearance is unchanged.  No intracranial hemorrhage.  No intracranial mass lesion detected on this unenhanced exam.  Partially empty sella.  Paranasal sinus mucosal  thickening.  IMPRESSION: Subacute small infarct posterior right corona radiata.  Please see above.  MRA HEAD  Findings: Mild narrowing supraclinoid aspect of the internal carotid artery bilaterally greater on the left.  Mild irregularity and narrowing involving the A1 segment of the anterior cerebral artery bilaterally.  No significant stenosis M1 segment middle cerebral artery.  Middle cerebral artery branch vessel irregularity bilaterally.  Left vertebral artery is poorly delineated along its distal aspect appearing narrowed in addition to ending in a PICA distribution.  Mild to moderate narrowing distal right vertebral artery.  Poor delineation of the PICAs and AICAs.  Mild irregularity of the basilar artery without high-grade stenosis.  Narrowing of portions of the superior cerebellar arteries and posterior cerebral arteries bilaterally.  No aneurysm noted.  IMPRESSION: Mild progression of intracranial atherosclerotic type changes. Atherosclerotic type changes are more notable involving the posterior circulation.  Please see above.   Original Report Authenticated By: Lacy Duverney, M.D.     Admission History of Present Illness:  79yo male who lives at an assisted living facility 2/2 multiple falls, with Parkinson's disease, mild dementia, lung cancer (T1N0 NSCLC of right middle lobe), and previous TIAs, on ASA and Plavix presented to the ED after he was witnesses having a a syncopal event and was unresponsive for several minutes and had a left facial droop and slurred speech. A code stoke was called in the ED but was canceled b/c his facial droop and slurred speech were determined to be present before this syncopal event. He denies any SOB, chest pain, headache, neck pain, dizziness, and says that he feels like his normal self.   Review of Systems:  Constitutional: Denies fever, chills, or appetite change.  HEENT: Denies vision or hearing loss, or neck pain.  Respiratory: Denies SOB, DOE    Cardiovascular: Denies chest pain, palpitations or leg swelling.  Gastrointestinal: Denies nausea, vomiting, abdominal pain, diarrhea, constipation  Genitourinary: Denies dysuria, urgency, frequency.  Musculoskeletal: Denies myalgias, back pain  Skin: Denies rash or wound.  Neurological: Denies dizziness, seizures, weakness, or headaches.  Psychiatric/Behavioral: Denies mood changes   Physical Exam:  Blood pressure 169/67, pulse 63, temperature 98.7 F (37.1 C), resp. rate 16, SpO2 100.00%.  General: Sitting up in bed, slightly leaning over to the left. Well-developed, and cooperative on examination.  Head: Normocephalic and atraumatic.  Eyes: Pupils equal, round, and reactive to light, EOMI, no injection and anicteric.  Mouth: Slight left sided droop, seen when smiling. Pharynx pink and moist, no erythema or exudates, dentures in place.  Neck: Supple, full ROM, no thyromegaly  Lungs: CTAB, normal respiratory effort, no accessory muscle use, no crackles, and no wheezes. Heart: Regular rate, regular rhythm, no murmur, no gallop, and no rub.  Abdomen: Soft, non-tender, non-distended, normal bowel sounds, no guarding, no rebound tenderness, no organomegaly.  Msk: No  joint swelling, warmth, or erythema.  Extremities: 2+ radial and DP pulses bilaterally.  Neurologic: Alert & oriented to person and place, cranial nerves II-XII intact, strength normal in all extremities. Skin: Turgor normal and no rashes.  Psych: Slurred speech, some difficulty with memory.     Hospital Course by problem list: 78yo male who lives at an assisted living facility 2/2 multiple falls, with Parkinson's disease, mild dementia, HLD, lung cancer (T1N0 NSCLC of right middle lobe), and previous TIAs, on ASA and Plavix presented to the ED after he was witnesses having a a syncopal event and was unresponsive for several minutes, thought to be a CVA.   1. TIA: Per medical records, pt with h/o TIAs. He is currently on ASA  81mg  daily and Plavix 75mg  daily. With his dementia and slurred speech, unable to obtain a good PMH from the pt. A CT head showed encephalomalacia involving the left frontal region as well as multiple subcortical small vessel infarctions with no acute changes. Neurology was consulted in the ED, and felt that he was not a good TPA candidate due to his confusion and NIH score of 3. It was recommended to continue Plavix and ASA and MRI, MRA of the brain w/o contrast, which showed subacute small infarct posterior right corona radiata, global atrophy, multiple remote infarcts, and mild progression of intracranial atherosclerotic type changes more notable involving the posterior circulation.  His last ECHO from 01/31/11 showed grade 1 diastolic dysfunction. Carotid dopplers were performed prior to discharge and showed no evidence of hemodynamically significant internal carotid artery stenosis and vertebral artery flow is antegrade. In speaking to [name] son, who saw [name] in the ED, he feels that his father is at his baseline and has no new deficits. - Continue ASA 81mg  daily  - Continue Plavix 75mg  daily   2. HLD: Pt with h/o hyperlipidemia. He is on simvastatin at home. Last lipid panel from 1/13 with total cholesterol of141, LDL 76, HDL 46, and TG 97. Simvastatin was continued on admission. Repeat lipid panel with elevated LDL to 107, but this was likely not fasting.  - Continue Simvastatin 40mg  qhs   3. Early Parkinson's/Dementia: Pt with h/o dementia. He is currently being treated with depakote and lexapro, which were continued on admission and discharge.   4. T1N0 NSCLC of right middle lobe: He is s/p left upper lobectomy and SBRT to a right middle lobe lesion with no evidence of disease, per Radiation Oncology note from September. A Ct chest was performed and showed no new nodules. Per RadOnc, plans to re-scan [name] within the next 6 months   Discharge Vitals:  BP 157/68  Pulse 72   Temp 98.3 F (36.8 C) (Oral)  Resp 14  Ht 5\' 8"  (1.727 m)  Wt 155 lb (70.308 kg)  BMI 23.57 kg/m2  SpO2 100%  Discharge Labs:  Results for orders placed during the hospital encounter of 11/18/11 (from the past 24 hour(s))  CBC     Status: Abnormal   Collection Time   11/18/11  7:51 PM      Component Value Range   WBC 3.4 (*) 4.0 - 10.5 K/uL   RBC 4.20 (*) 4.22 - 5.81 MIL/uL   Hemoglobin 11.9 (*) 13.0 - 17.0 g/dL   HCT 29.5 (*) 62.1 - 30.8 %   MCV 88.3  78.0 - 100.0 fL   MCH 28.3  26.0 - 34.0 pg   MCHC 32.1  30.0 - 36.0 g/dL   RDW  15.5  11.5 - 15.5 %   Platelets 145 (*) 150 - 400 K/uL  CREATININE, SERUM     Status: Abnormal   Collection Time   11/18/11  7:51 PM      Component Value Range   Creatinine, Ser 1.43 (*) 0.50 - 1.35 mg/dL   GFR calc non Af Amer 42 (*) >90 mL/min   GFR calc Af Amer 49 (*) >90 mL/min  URINE RAPID DRUG SCREEN (HOSP PERFORMED)     Status: Normal   Collection Time   11/19/11  2:19 AM      Component Value Range   Opiates NONE DETECTED  NONE DETECTED   Cocaine NONE DETECTED  NONE DETECTED   Benzodiazepines NONE DETECTED  NONE DETECTED   Amphetamines NONE DETECTED  NONE DETECTED   Tetrahydrocannabinol NONE DETECTED  NONE DETECTED   Barbiturates NONE DETECTED  NONE DETECTED  URINALYSIS, ROUTINE W REFLEX MICROSCOPIC     Status: Abnormal   Collection Time   11/19/11  2:19 AM      Component Value Range   Color, Urine YELLOW  YELLOW   APPearance CLEAR  CLEAR   Specific Gravity, Urine 1.021  1.005 - 1.030   pH 6.0  5.0 - 8.0   Glucose, UA NEGATIVE  NEGATIVE mg/dL   Hgb urine dipstick SMALL (*) NEGATIVE   Bilirubin Urine NEGATIVE  NEGATIVE   Ketones, ur NEGATIVE  NEGATIVE mg/dL   Protein, ur NEGATIVE  NEGATIVE mg/dL   Urobilinogen, UA 1.0  0.0 - 1.0 mg/dL   Nitrite NEGATIVE  NEGATIVE   Leukocytes, UA TRACE (*) NEGATIVE  URINE MICROSCOPIC-ADD ON     Status: Normal   Collection Time   11/19/11  2:19 AM      Component Value Range   Squamous  Epithelial / LPF RARE  RARE   WBC, UA 0-2  <3 WBC/hpf   RBC / HPF 7-10  <3 RBC/hpf   Bacteria, UA RARE  RARE  MRSA PCR SCREENING     Status: Abnormal   Collection Time   11/19/11  3:54 AM      Component Value Range   MRSA by PCR POSITIVE (*) NEGATIVE  LIPID PANEL     Status: Abnormal   Collection Time   11/19/11  6:30 AM      Component Value Range   Cholesterol 173  0 - 200 mg/dL   Triglycerides 68  <119 mg/dL   HDL 52  >14 mg/dL   Total CHOL/HDL Ratio 3.3     VLDL 14  0 - 40 mg/dL   LDL Cholesterol 782 (*) 0 - 99 mg/dL    Signed: Genelle Gather 11/19/2011, 12:21 PM   Time Spent on Discharge: Services Ordered on Discharge: None- return to assisted living Equipment Ordered on Discharge: None- resume care

## 2011-12-27 ENCOUNTER — Inpatient Hospital Stay (HOSPITAL_COMMUNITY)
Admission: EM | Admit: 2011-12-27 | Discharge: 2012-01-01 | DRG: 065 | Disposition: A | Discharge status: Skilled Nursing Facility | Payer: Medicare Other | Attending: Family Medicine | Admitting: Family Medicine

## 2011-12-27 ENCOUNTER — Emergency Department (HOSPITAL_COMMUNITY): Payer: Medicare Other

## 2011-12-27 ENCOUNTER — Encounter (HOSPITAL_COMMUNITY): Payer: Self-pay | Admitting: Emergency Medicine

## 2011-12-27 DIAGNOSIS — I10 Essential (primary) hypertension: Secondary | ICD-10-CM | POA: Diagnosis present

## 2011-12-27 DIAGNOSIS — F0390 Unspecified dementia without behavioral disturbance: Secondary | ICD-10-CM

## 2011-12-27 DIAGNOSIS — Z87891 Personal history of nicotine dependence: Secondary | ICD-10-CM

## 2011-12-27 DIAGNOSIS — N179 Acute kidney failure, unspecified: Secondary | ICD-10-CM

## 2011-12-27 DIAGNOSIS — F0280 Dementia in other diseases classified elsewhere without behavioral disturbance: Secondary | ICD-10-CM | POA: Diagnosis present

## 2011-12-27 DIAGNOSIS — I2510 Atherosclerotic heart disease of native coronary artery without angina pectoris: Secondary | ICD-10-CM | POA: Diagnosis present

## 2011-12-27 DIAGNOSIS — G20A1 Parkinson's disease without dyskinesia, without mention of fluctuations: Secondary | ICD-10-CM

## 2011-12-27 DIAGNOSIS — E860 Dehydration: Secondary | ICD-10-CM

## 2011-12-27 DIAGNOSIS — G459 Transient cerebral ischemic attack, unspecified: Secondary | ICD-10-CM

## 2011-12-27 DIAGNOSIS — G20 Parkinson's disease: Secondary | ICD-10-CM | POA: Diagnosis present

## 2011-12-27 DIAGNOSIS — R4182 Altered mental status, unspecified: Secondary | ICD-10-CM

## 2011-12-27 DIAGNOSIS — Z85118 Personal history of other malignant neoplasm of bronchus and lung: Secondary | ICD-10-CM

## 2011-12-27 DIAGNOSIS — R569 Unspecified convulsions: Secondary | ICD-10-CM

## 2011-12-27 DIAGNOSIS — C3490 Malignant neoplasm of unspecified part of unspecified bronchus or lung: Secondary | ICD-10-CM

## 2011-12-27 DIAGNOSIS — R29898 Other symptoms and signs involving the musculoskeletal system: Secondary | ICD-10-CM

## 2011-12-27 DIAGNOSIS — I639 Cerebral infarction, unspecified: Secondary | ICD-10-CM

## 2011-12-27 DIAGNOSIS — I6350 Cerebral infarction due to unspecified occlusion or stenosis of unspecified cerebral artery: Principal | ICD-10-CM | POA: Diagnosis present

## 2011-12-27 LAB — URINALYSIS, ROUTINE W REFLEX MICROSCOPIC
Ketones, ur: 15 mg/dL — AB
Leukocytes, UA: NEGATIVE
Nitrite: NEGATIVE
Protein, ur: NEGATIVE mg/dL
Urobilinogen, UA: 1 mg/dL (ref 0.0–1.0)

## 2011-12-27 LAB — GLUCOSE, CAPILLARY
Glucose-Capillary: 82 mg/dL (ref 70–99)
Glucose-Capillary: 85 mg/dL (ref 70–99)

## 2011-12-27 LAB — COMPREHENSIVE METABOLIC PANEL
ALT: 7 U/L (ref 0–53)
AST: 18 U/L (ref 0–37)
Albumin: 3 g/dL — ABNORMAL LOW (ref 3.5–5.2)
Alkaline Phosphatase: 50 U/L (ref 39–117)
BUN: 27 mg/dL — ABNORMAL HIGH (ref 6–23)
Chloride: 101 mEq/L (ref 96–112)
Potassium: 4.2 mEq/L (ref 3.5–5.1)
Sodium: 137 mEq/L (ref 135–145)
Total Bilirubin: 0.4 mg/dL (ref 0.3–1.2)

## 2011-12-27 LAB — DIFFERENTIAL
Basophils Relative: 0 % (ref 0–1)
Monocytes Relative: 18 % — ABNORMAL HIGH (ref 3–12)
Neutro Abs: 1.7 10*3/uL (ref 1.7–7.7)
Neutrophils Relative %: 52 % (ref 43–77)

## 2011-12-27 LAB — CBC
Hemoglobin: 12.7 g/dL — ABNORMAL LOW (ref 13.0–17.0)
Hemoglobin: 12.8 g/dL — ABNORMAL LOW (ref 13.0–17.0)
MCHC: 32.2 g/dL (ref 30.0–36.0)
MCHC: 30.8 g/dL (ref 30.0–36.0)
RBC: 4.42 MIL/uL (ref 4.22–5.81)
RBC: 4.66 MIL/uL (ref 4.22–5.81)
WBC: 3.3 10*3/uL — ABNORMAL LOW (ref 4.0–10.5)
WBC: 3.2 10*3/uL — ABNORMAL LOW (ref 4.0–10.5)

## 2011-12-27 LAB — CREATININE, SERUM
Creatinine, Ser: 1.44 mg/dL — ABNORMAL HIGH (ref 0.50–1.35)
GFR calc Af Amer: 48 mL/min — ABNORMAL LOW (ref >90)
GFR calc non Af Amer: 42 mL/min — ABNORMAL LOW (ref >90)

## 2011-12-27 LAB — RAPID URINE DRUG SCREEN, HOSP PERFORMED
Amphetamines: NOT DETECTED
Barbiturates: NOT DETECTED
Tetrahydrocannabinol: NOT DETECTED

## 2011-12-27 LAB — POCT I-STAT TROPONIN I: Troponin i, poc: 0 ng/mL (ref 0.00–0.08)

## 2011-12-27 LAB — POCT I-STAT, CHEM 8
Calcium, Ion: 1.22 mmol/L (ref 1.13–1.30)
Glucose, Bld: 92 mg/dL (ref 70–99)
HCT: 41 % (ref 39.0–52.0)
TCO2: 30 mmol/L (ref 0–100)

## 2011-12-27 LAB — MRSA PCR SCREENING: MRSA by PCR: POSITIVE — AB

## 2011-12-27 LAB — TROPONIN I: Troponin I: <0.3 ng/mL (ref <0.30)

## 2011-12-27 LAB — PROTIME-INR: INR: 1.09 (ref 0.00–1.49)

## 2011-12-27 LAB — APTT: aPTT: 28 seconds (ref 24–37)

## 2011-12-27 MED ORDER — ONDANSETRON HCL 4 MG/2ML IJ SOLN: 4.0000 mg | Freq: Four times a day (QID) | INTRAMUSCULAR | Status: DC | PRN

## 2011-12-27 MED ORDER — ALLOPURINOL 300 MG PO TABS
300.0000 mg | ORAL_TABLET | Freq: Every morning | ORAL | Status: DC
  Administered 2011-12-28 – 2012-01-01 (×5): 300 mg via ORAL
  Filled 2011-12-27 (×5): qty 1

## 2011-12-27 MED ORDER — ENOXAPARIN SODIUM 30 MG/0.3ML ~~LOC~~ SOLN
30.0000 mg | SUBCUTANEOUS | Status: DC
  Administered 2011-12-27 – 2011-12-28 (×2): 30 mg via SUBCUTANEOUS
  Filled 2011-12-27 (×4): qty 0.3

## 2011-12-27 MED ORDER — ASPIRIN 325 MG PO TABS
325.0000 mg | ORAL_TABLET | Freq: Every day | ORAL | Status: DC
  Administered 2011-12-27 – 2012-01-01 (×6): 325 mg via ORAL
  Filled 2011-12-27 (×7): qty 1

## 2011-12-27 MED ORDER — DIVALPROEX SODIUM 250 MG PO DR TAB
250.0000 mg | DELAYED_RELEASE_TABLET | Freq: Two times a day (BID) | ORAL | Status: DC
  Administered 2011-12-27 – 2011-12-30 (×6): 250 mg via ORAL
  Filled 2011-12-27 (×7): qty 1

## 2011-12-27 MED ORDER — SENNOSIDES-DOCUSATE SODIUM 8.6-50 MG PO TABS: 1.0000 | ORAL_TABLET | Freq: Every evening | ORAL | Status: DC | PRN

## 2011-12-27 MED ORDER — MECLIZINE HCL 25 MG PO TABS
25.0000 mg | ORAL_TABLET | Freq: Two times a day (BID) | ORAL | Status: DC
  Administered 2011-12-27 – 2012-01-01 (×10): 25 mg via ORAL
  Filled 2011-12-27 (×11): qty 1

## 2011-12-27 MED ORDER — ASPIRIN 300 MG RE SUPP
300.0000 mg | Freq: Every day | RECTAL | Status: DC
  Filled 2011-12-27 (×6): qty 1

## 2011-12-27 MED ORDER — ACETAMINOPHEN 325 MG PO TABS: 650.0000 mg | ORAL_TABLET | ORAL | Status: DC | PRN

## 2011-12-27 MED ORDER — HYDRALAZINE HCL 20 MG/ML IJ SOLN
10.0000 mg | Freq: Four times a day (QID) | INTRAMUSCULAR | Status: DC | PRN
  Filled 2011-12-27: qty 0.5

## 2011-12-27 MED ORDER — SIMVASTATIN 40 MG PO TABS
40.0000 mg | ORAL_TABLET | Freq: Every day | ORAL | Status: DC
  Administered 2011-12-27 – 2011-12-29 (×3): 40 mg via ORAL
  Filled 2011-12-27 (×4): qty 1

## 2011-12-27 MED ORDER — SODIUM CHLORIDE 0.9 % IV SOLN
INTRAVENOUS | Status: DC
  Administered 2011-12-27: 999 mL via INTRAVENOUS

## 2011-12-27 MED ORDER — CLOPIDOGREL BISULFATE 75 MG PO TABS
75.0000 mg | ORAL_TABLET | Freq: Every morning | ORAL | Status: DC
  Administered 2011-12-28 – 2012-01-01 (×5): 75 mg via ORAL
  Filled 2011-12-27 (×5): qty 1

## 2011-12-27 MED ORDER — ESCITALOPRAM OXALATE 10 MG PO TABS
10.0000 mg | ORAL_TABLET | Freq: Every morning | ORAL | Status: DC
Start: 2011-12-28 — End: 2012-01-01
  Administered 2011-12-28 – 2012-01-01 (×5): 10 mg via ORAL
  Filled 2011-12-27 (×5): qty 1

## 2011-12-27 MED ORDER — ACETAMINOPHEN 650 MG RE SUPP: 650.0000 mg | RECTAL | Status: DC | PRN

## 2011-12-27 NOTE — H&P (Signed)
History and Physical       Hospital Admission Note Date: 12/27/2011  Patient name: Christopher Cook Medical record number: [id number] Date of birth: June 09, 1921 Age: 78 y.o. Gender: male PCP: Pola Corn, MD    Chief Complaint:  Left arm weakness during lunch   HPI: Patient is 78 year old male with history of previous stroke, Parkinson's disease, hypertension, history of lung cancer, CAD, gout was assisted living facility resident was eating lunch today when he noticed left arm weakness. Patient was seen last normal around 9 AM at the facility. Patient denies any other focal weakness, he does not have any significant slurred speech at this time. Patient was admitted last month under internal medicine teaching service when he had slurred speech and confusion and was found to have subacute small infarct in the posterior right corona radiata. Patient is on aspirin and Plavix outpatient. Patient was evaluated by neurology service in the ED, he was not considered as TPA candidate due to delayed presentation and recent CVA in 11/2011  Review of Systems:  Constitutional: Denies fever, chills, diaphoresis, appetite change and fatigue.  HEENT: Denies photophobia, eye pain, redness, hearing loss, ear pain, congestion, sore throat, rhinorrhea, sneezing, mouth sores, trouble swallowing, neck pain, neck stiffness and tinnitus.   Respiratory: Denies SOB, DOE, cough, chest tightness,  and wheezing.   Cardiovascular: Denies chest pain, palpitations and leg swelling.  Gastrointestinal: Denies nausea, vomiting, abdominal pain, diarrhea, constipation, blood in stool and abdominal distention.  Genitourinary: Denies dysuria, urgency, frequency, hematuria, flank pain and difficulty urinating.  Musculoskeletal: Denies myalgias, back pain, joint swelling, arthralgias and gait problem.  Skin: Denies pallor, rash and wound.  Neurological: see HPI Hematological:  Denies adenopathy. Easy bruising, personal or family bleeding history  Psychiatric/Behavioral: he has history of dementia and agitation, currently on Depakote for that  Past Medical History: Past Medical History  Diagnosis Date  . Lung cancer dx'd 2006    surg only  . Hypertension   . Stroke   . Parkinson disease   . Coronary artery disease   . Gout   . Dementia   . Angina   . Shortness of breath   . TIA (transient ischemic attack)   . Renal disorder Chronic Kidney disease   Past Surgical History  Procedure Date  . Lasik   . Coronary angioplasty with stent placement   . Lobectomy     Medications: Prior to Admission medications   Medication Sig Start Date End Date Taking? Authorizing Provider  allopurinol (ZYLOPRIM) 300 MG tablet Take 300 mg by mouth every morning.    Yes Historical Provider, MD  aspirin EC 81 MG tablet Take 81 mg by mouth every morning.   Yes Historical Provider, MD  clopidogrel (PLAVIX) 75 MG tablet Take 75 mg by mouth every morning.    Yes Historical Provider, MD  divalproex (DEPAKOTE) 250 MG DR tablet Take 250 mg by mouth 2 (two) times daily.   Yes Historical Provider, MD  escitalopram (LEXAPRO) 10 MG tablet Take 10 mg by mouth every morning.    Yes Historical Provider, MD  meclizine (ANTIVERT) 50 MG tablet Take 25 mg by mouth 2 (two) times daily.   Yes Historical Provider, MD  Multiple Vitamin (TAB-A-VITE PO) Take 1 tablet by mouth every morning.   Yes Historical Provider, MD  simvastatin (ZOCOR) 40 MG tablet Take 40 mg by mouth at bedtime.    Yes Historical Provider, MD    Allergies:  No Known Allergies  Social History:  reports that  he has quit smoking. He has never used smokeless tobacco. He reports that he does not drink alcohol or use illicit drugs. He is currently resident of [facility]  Family History: History reviewed. No pertinent family history.  Physical Exam: Blood pressure 184/86, pulse 68, temperature 97.7 F (36.5 C),  temperature source Oral, resp. rate 16, height 5\' 8"  (1.727 m), weight 65.772 kg (145 lb), SpO2 100.00%. General: Alert, awake, oriented x3, in no acute distress. HEENT: normocephalic, atraumatic, anicteric sclera, pink conjunctiva, pupils equal and reactive to light and accomodation, oropharynx clear Neck: supple, no masses or lymphadenopathy, no goiter, no bruits  Heart: Regular rate and rhythm, without murmurs, rubs or gallops. Lungs: Clear to auscultation bilaterally, no wheezing, rales or rhonchi. Abdomen: Soft, nontender, nondistended, positive bowel sounds, no masses. Extremities: No clubbing, cyanosis or edema with positive pedal pulses. Neuro: RUE and RLE strength 5/ 5, LLE 5/5, LUE 2/5, gait not tested, normal finger to nose, Cr N's II-XII intact Psych: alert and oriented x 3, normal mood and affect Skin: no rashes or lesions, warm and dry   LABS on Admission:  Basic Metabolic Panel:  Lab 12/27/11 1610 12/27/11 1438  NA 141 137  K 4.4 4.2  CL 104 101  CO2 -- 28  GLUCOSE 92 95  BUN 29* 27*  CREATININE 1.90* 1.73*  CALCIUM -- 9.4  MG -- --  PHOS -- --   Liver Function Tests:  Lab 12/27/11 1438  AST 18  ALT 7  ALKPHOS 50  BILITOT 0.4  PROT 6.9  ALBUMIN 3.0*   CBC:  Lab 12/27/11 1454 12/27/11 1438  WBC -- 3.3*  NEUTROABS -- 1.7  HGB 13.9 12.7*  HCT 41.0 39.4  MCV -- 89.1  PLT -- 143*   Cardiac Enzymes:  Lab 12/27/11 1438  CKTOTAL --  CKMB --  CKMBINDEX --  TROPONINI <0.30   CBG:  Lab 12/27/11 1353  GLUCAP 82     Radiological Exams on Admission: Ct Head Wo Contrast  12/27/2011  *RADIOLOGY REPORT*  Clinical Data: Code stroke, left arm paralysis  CT HEAD WITHOUT CONTRAST  Technique:  Contiguous axial images were obtained from the base of the skull through the vertex without contrast.  Comparison: MRI 11/18/2011 and head CT 11/18/2011  Findings: Marked diffuse cortical volume loss persists with proportional ventricular prominence.  Left  frontoparietal encephalomalacia again noted.  Remote right caudate and bilateral external capsule lacunar infarcts are noted.  No midline shift.  No acute hemorrhage, acute infarction, or mass lesion is seen.  Mild ethmoid and left maxillary mucoperiosteal thickening.  IMPRESSION: Stable findings as above, no acute intracranial finding.  Mild sinusitis.  Results called to Dr. Amada Jupiter by Dr. Chilton Si on 12/27/2011 at 1:09 p.m.   Original Report Authenticated By: Christiana Pellant, M.D.     Assessment/Plan Principal Problem:   Left arm weakness: Possibility of acute CVA with recent subacute small infarct posterior right corona radiata in 11/2011, ?embolic  - Admit for stroke work up, will obtain MRI/MRA - Repeat 2-D echocardiogram, carotid Dopplers were done in 11 2013 showed no significant extra cranial carotid artery stenosis - Continue aspirin, Plavix, statin, PT, OT eval - Neurology has been consulted, will follow recommendations  Active Problems:  Accelerated hypertension: - add hydralazine IV PRN with parameters. The patient is not on any antihypertensives outpatient   AKI (acute kidney injury), dehydration with ketonuria  - Baseline creatinine 1.4, will gently hydrate  DVT prophylaxis: lovenox  CODE STATUS: full code  Further plan will depend as patient's clinical course evolves and further radiologic and laboratory data become available.   Time Spent on Admission: 1 hour  Katee Wentland M.D. [facility] 12/27/2011, 4:19 PM Pager: [phone]  If 7PM-7AM, please contact night-coverage [email or web address] Password [email or web address]

## 2011-12-27 NOTE — ED Notes (Signed)
Per EMS, pt from [facility].  Called out by pt's son who noticed pt has left arm paralysis while eating lunch.  Last seen normal by staff at 0900.  Pt reportedly recently seen/ dx here with TIA.  Pt noted to be sitting wheelchair upon EMS arrival.

## 2011-12-27 NOTE — ED Notes (Signed)
Winford (son) contact number (H) [phone],  (C) [phone]

## 2011-12-27 NOTE — ED Provider Notes (Signed)
History     CSN: [id number]  Arrival date & time 12/27/11  1255   First MD Initiated Contact with Patient 12/27/11 1306      Chief Complaint  Patient presents with  . Code Stroke    (Consider location/radiation/quality/duration/timing/severity/associated sxs/prior treatment) HPI Level 5 caveat due to urgent need for intervention Pt brought to the ED via EMS as a Code Stroke from [facility] assisted living after pt found to be unable to move L arm during lunch. Last seen normal was around 9am by staff.    Past Medical History  Diagnosis Date  . Lung cancer dx'd 2006    surg only  . Hypertension   . Stroke   . Parkinson disease   . Coronary artery disease   . Gout   . Dementia   . Angina   . Shortness of breath   . TIA (transient ischemic attack)   . Renal disorder Chronic Kidney disease    Past Surgical History  Procedure Date  . Lasik   . Coronary angioplasty with stent placement   . Lobectomy     History reviewed. No pertinent family history.  History  Substance Use Topics  . Smoking status: Former Games developer  . Smokeless tobacco: Never Used  . Alcohol Use: No      Review of Systems Unable to assess due to urgent need for intervention  Allergies  Review of patient's allergies indicates no known allergies.  Home Medications   Current Outpatient Rx  Name  Route  Sig  Dispense  Refill  . ALLOPURINOL 300 MG PO TABS   Oral   Take 300 mg by mouth every morning.          . ASPIRIN EC 81 MG PO TBEC   Oral   Take 81 mg by mouth every morning.         Marland Kitchen CLOPIDOGREL BISULFATE 75 MG PO TABS   Oral   Take 75 mg by mouth every morning.          Marland Kitchen DIVALPROEX SODIUM 250 MG PO TBEC   Oral   Take 250 mg by mouth 2 (two) times daily.         Marland Kitchen ESCITALOPRAM OXALATE 10 MG PO TABS   Oral   Take 10 mg by mouth every morning.          Marland Kitchen MECLIZINE HCL 50 MG PO TABS   Oral   Take 25 mg by mouth 2 (two) times daily.         . TAB-A-VITE PO    Oral   Take 1 tablet by mouth every morning.         Marland Kitchen SIMVASTATIN 40 MG PO TABS   Oral   Take 40 mg by mouth at bedtime.            BP 174/78  Pulse 71  Temp 97.7 F (36.5 C) (Oral)  Resp 18  Ht 5\' 8"  (1.727 m)  Wt 145 lb (65.772 kg)  BMI 22.05 kg/m2  SpO2 99%  Physical Exam  Nursing note and vitals reviewed. Constitutional: He appears well-developed and well-nourished.  HENT:  Head: Normocephalic and atraumatic.  Eyes: EOM are normal. Pupils are equal, round, and reactive to light.  Neck: Normal range of motion. Neck supple.  Cardiovascular: Normal rate, normal heart sounds and intact distal pulses.   Pulmonary/Chest: Effort normal and breath sounds normal.  Abdominal: Bowel sounds are normal. He exhibits no distension. There is no tenderness.  Musculoskeletal: Normal range of motion. He exhibits no edema and no tenderness.  Neurological: He is alert. No cranial nerve deficit.       L arm weakness, otherwise non-focal exam; for full NIHSS please see Neurology team notes  Skin: Skin is warm and dry. No rash noted.  Psychiatric: He has a normal mood and affect.    ED Course  Procedures (including critical care time)  Labs Reviewed  CBC - Abnormal; Notable for the following:    WBC 3.3 (*)     Hemoglobin 12.7 (*)     Platelets 143 (*)     All other components within normal limits  DIFFERENTIAL - Abnormal; Notable for the following:    Monocytes Relative 18 (*)     Eosinophils Relative 9 (*)     All other components within normal limits  URINALYSIS, ROUTINE W REFLEX MICROSCOPIC - Abnormal; Notable for the following:    Color, Urine AMBER (*)  BIOCHEMICALS MAY BE AFFECTED BY COLOR   APPearance HAZY (*)     Bilirubin Urine SMALL (*)     Ketones, ur 15 (*)     All other components within normal limits  GLUCOSE, CAPILLARY  ETHANOL  PROTIME-INR  APTT  COMPREHENSIVE METABOLIC PANEL  TROPONIN I  URINE RAPID DRUG SCREEN (HOSP PERFORMED)   Ct Head Wo  Contrast  12/27/2011  *RADIOLOGY REPORT*  Clinical Data: Code stroke, left arm paralysis  CT HEAD WITHOUT CONTRAST  Technique:  Contiguous axial images were obtained from the base of the skull through the vertex without contrast.  Comparison: MRI 11/18/2011 and head CT 11/18/2011  Findings: Marked diffuse cortical volume loss persists with proportional ventricular prominence.  Left frontoparietal encephalomalacia again noted.  Remote right caudate and bilateral external capsule lacunar infarcts are noted.  No midline shift.  No acute hemorrhage, acute infarction, or mass lesion is seen.  Mild ethmoid and left maxillary mucoperiosteal thickening.  IMPRESSION: Stable findings as above, no acute intracranial finding.  Mild sinusitis.  Results called to Dr. Amada Jupiter by Dr. Chilton Si on 12/27/2011 at 1:09 p.m.   Original Report Authenticated By: Christiana Pellant, M.D.      No diagnosis found.    MDM  CT neg for bleed, pt is not a candidate for tPA due to low NIH score and recent admission for stroke.     Date: 12/27/2011  Rate: 71  Rhythm: sinus with 1st degree AV block  QRS Axis: left  Intervals: PR prolonged  ST/T Wave abnormalities: nonspecific T wave changes  Conduction Disutrbances:right bundle branch block and left anterior fascicular block  Narrative Interpretation:   Old EKG Reviewed: unchanged  2:59 PM Pt a difficult blood draw hence delay in lab results. Awaiting labs results before consulting Hospitalist for admission.      Charles B. Bernette Mayers, MD 12/28/11 [phone]

## 2011-12-27 NOTE — Consult Note (Addendum)
Referring Physician:  Dr. Bernette Mayers    Chief Complaint: left arm weakness  HPI: Christopher Cook is an 78 y.o. male who was LKW at 0900 was eating lunch later with son who noticed left arm paralysis. EMS called. Patient has dementia and Parkinsons disease. Has been living in [facility] for past couple of years. Last admitted to [facility] November 2013. At that time he was admitted slurred speech, weakness, confusion (has dementia) with subacute small infarct posterior right corona radiata on scan. On plavix and baby aspirin as outpatient. Patient has been on depakote but this is for agitation.   Date last known well: 12/27/2011 Time last known well: 0900  tPA Given: No: patient had stroke 11/2011, too early within time frame for administration of tPA.  Past Medical History  Diagnosis Date  . Lung cancer dx'd 2006    surg only  . Hypertension   . Stroke   . Parkinson disease   . Coronary artery disease   . Gout   . Dementia   . Angina   . Shortness of breath   . TIA (transient ischemic attack)   . Renal disorder Chronic Kidney disease    Past Surgical History  Procedure Date  . Lasik   . Coronary angioplasty with stent placement   . Lobectomy    Family history: no known family history of stroke.   Social History: No known tobacco, alcohol, or illicit drug use.  Allergies: No known drug allergies  No current facility-administered medications for this encounter.   Current Outpatient Prescriptions  Medication Sig Dispense Refill  . allopurinol (ZYLOPRIM) 300 MG tablet Take 300 mg by mouth every morning.       Marland Kitchen aspirin EC 81 MG tablet Take 81 mg by mouth every morning.      . clopidogrel (PLAVIX) 75 MG tablet Take 75 mg by mouth every morning.       . divalproex (DEPAKOTE) 250 MG DR tablet Take 250 mg by mouth 2 (two) times daily.      Marland Kitchen escitalopram (LEXAPRO) 10 MG tablet Take 10 mg by mouth every morning.       . meclizine (ANTIVERT) 50 MG tablet Take 25 mg by mouth 2  (two) times daily.      . Multiple Vitamin (TAB-A-VITE PO) Take 1 tablet by mouth every morning.      . simvastatin (ZOCOR) 40 MG tablet Take 40 mg by mouth at bedtime.        ROS: History obtained from child and unobtainable from patient due to mental status  General ROS: negative for - chills, fatigue, fever, night sweats, weight gain or weight loss Psychological ROS: negative for - behavioral disorder, hallucinations, mood swings or suicidal ideation, +memory difficulties Ophthalmic ROS: negative for - blurry vision, double vision, eye pain or loss of vision ENT ROS: negative for - epistaxis, nasal discharge, oral lesions, sore throat, tinnitus or vertigo Allergy and Immunology ROS: negative for - hives or itchy/watery eyes Hematological and Lymphatic ROS: negative for - bleeding problems, bruising or swollen lymph nodes Endocrine ROS: negative for - galactorrhea, hair pattern changes, polydipsia/polyuria or temperature intolerance Respiratory ROS: negative for - cough, hemoptysis, shortness of breath or wheezing Cardiovascular ROS: negative for - chest pain, dyspnea on exertion, edema or irregular heartbeat Gastrointestinal ROS: negative for - abdominal pain, diarrhea, hematemesis, nausea/vomiting or stool incontinence Genito-Urinary ROS: negative for - dysuria, hematuria, incontinence or urinary frequency/urgency Musculoskeletal ROS: negative for - joint swelling, ++muscular/left arm weakness Neurological  ROS: as noted in HPI Dermatological ROS: negative for rash and skin lesion changes    Physical Examination: Blood pressure 174/78, pulse 71, temperature 97.7 F (36.5 C), temperature source Oral, resp. rate 18, height 5\' 8"  (1.727 m), weight 65.772 kg (145 lb), SpO2 99.00%.  Neurologic Examination: Mental Status: Alert, oriented to place but states that his age is 20, son's age is 50.  Speech is hypophonic and slightly dysarthric.  Able to follow 3 step commands without  difficulty. No extinction.  Cranial Nerves: II: visual fields grossly normal, pupils equal, round, reactive to light and accommodation III,IV, VI: ptosis not present, extra-ocular motions intact bilaterally V,VII: smile symmetric though may have a small lag on the left, facial pin sensation normal bilaterally VIII: hearing normal bilaterally IX,X: uvula elevates symmetrically XI: trapezius strength/neck flexion strength normal bilaterally XII: tongue strength normal  Motor: Right : Upper extremity   5/5    Left:     Upper extremity   2/5, worse distally than proximally.   Lower extremity   5/5     Lower extremity   5/5 Tone and bulk:normal tone throughout; atrophy noted throughout Sensory: Pinprick and light touch intact throughout, bilaterally Plantars: Right: downgoing   Left: downgoing Cerebellar: normal finger-to-nose on right. Patients gait not tested due to acute nature of the evaluation.      HGB A1C 11/2011=5.8 LDL 11/2011=107  ALL OTHER LABS HAVE JUST BEEN DRAWN. ALL PENDING.   2D Echo 01/2011=EF 60%, normal wall motion, LA dilated.  Carotid dopplers 11/19/2011=No significant extracranial carotid artery stenosis demonstrated. Right Vertebralis patent with antegrade flow. Left vertebral is atypical with loss of diastolic component which could be indicative of distal obstruction.     Ct Head Wo Contrast 12/27/2011  *RADIOLOGY REPORT*  Clinical Data: Code stroke, left arm paralysis  CT HEAD WITHOUT CONTRAST  Technique:  Contiguous axial images were obtained from the base of the skull through the vertex without contrast.  Comparison: MRI 11/18/2011 and head CT 11/18/2011  Findings: Marked diffuse cortical volume loss persists with proportional ventricular prominence.  Left frontoparietal encephalomalacia again noted.  Remote right caudate and bilateral external capsule lacunar infarcts are noted.  No midline shift.  No acute hemorrhage, acute infarction, or mass lesion is  seen.  Mild ethmoid and left maxillary mucoperiosteal thickening.  IMPRESSION: Stable findings as above, no acute intracranial finding.  Mild sinusitis.  Results called to Dr. Amada Jupiter by Dr. Chilton Si on 12/27/2011 at 1:09 p.m.   Original Report Authenticated By: Christiana Pellant, M.D.   11/18/2011 Extensive subcortical white matter and periventricular small vessel ischemic changes    MRI Head 11/18/2011 Subacute small infarct posterior right corona radiata. Remote small infarcts caudate bilaterally, right pons and within the thalami  MRA Head 11/18/2011 Mild progression of intracranial atherosclerotic type changes. Atherosclerotic type changes are more notable involving the posterior circulation    Assessment: 78 y.o. male with acute right arm weakness LKW 0900 on 12/27/2011, less than 6 weeks from previous stroke; therefore not tPA candidate.Was on Plavix and baby aspirin PTA.   Given his bilateral strokes, I suspect a cardioembolic source, but will need evaluation.   Stroke Risk Factors - hypertension and previous stroke and coronary artery disease and lung cancer  Plan: 1. PT consult, OT consult 2. Repeat Echocardiogram 3  Prophylactic therapy-Antiplatelet med: Aspirin - dose 81mg  and Antiplatelet med: Plavix - dose 75mg  4. Telemetry monitoring 5. Frequent neuro checks 6) Lipid panel, a1c 7) MRI brain  Guy Franco PA-C,  MBA, MHA [facility] Pager [phone]   I have seen and evaluated the patient. I have reviewed the above note and made appropriate changes. Sudden left arm faccid paralysis, likely small infarct. Admitting for stroke workup.   Ritta Slot, MD [facility] [phone]  If 7pm- 7am, please page neurology on call at [phone].

## 2011-12-28 ENCOUNTER — Inpatient Hospital Stay (HOSPITAL_COMMUNITY): Payer: Medicare Other

## 2011-12-28 DIAGNOSIS — G20 Parkinson's disease: Secondary | ICD-10-CM

## 2011-12-28 DIAGNOSIS — I359 Nonrheumatic aortic valve disorder, unspecified: Secondary | ICD-10-CM

## 2011-12-28 LAB — LIPID PANEL
Cholesterol: 171 mg/dL (ref 0–200)
HDL: 53 mg/dL (ref >39)
LDL Cholesterol: 100 mg/dL — ABNORMAL HIGH (ref 0–99)
Triglycerides: 89 mg/dL (ref <150)

## 2011-12-28 LAB — HEMOGLOBIN A1C
Hgb A1c MFr Bld: 5.6 % (ref <5.7)
Mean Plasma Glucose: 114 mg/dL (ref <117)

## 2011-12-28 MED ORDER — AMLODIPINE BESYLATE 10 MG PO TABS
10.0000 mg | ORAL_TABLET | Freq: Every day | ORAL | Status: DC
  Administered 2011-12-28 – 2012-01-01 (×4): 10 mg via ORAL
  Filled 2011-12-28 (×5): qty 1

## 2011-12-28 MED ORDER — CHLORHEXIDINE GLUCONATE CLOTH 2 % EX PADS
6.0000 | MEDICATED_PAD | Freq: Every day | CUTANEOUS | Status: DC
  Administered 2011-12-29 – 2012-01-01 (×3): 6 via TOPICAL

## 2011-12-28 MED ORDER — MUPIROCIN 2 % EX OINT
1.0000 "application " | TOPICAL_OINTMENT | Freq: Two times a day (BID) | CUTANEOUS | Status: DC
  Administered 2011-12-28 – 2012-01-01 (×9): 1 via NASAL
  Filled 2011-12-28 (×2): qty 22

## 2011-12-28 NOTE — Progress Notes (Signed)
Stroke Team Progress Note  HISTORY  Chief Complaint: left arm weakness   HPI: Christopher Cook is an 78 y.o. male who was LKW at 0900 was eating lunch later with son who noticed left arm paralysis. EMS called. Patient has dementia and Parkinsons disease. Has been living in [facility] for past couple of years. Last admitted to [facility] in November 2013. At that time he was admitted slurred speech, weakness, confusion (has dementia) with subacute small infarct posterior right corona radiata on scan. On plavix and baby aspirin as outpatient. Patient has been on depakote but this is for agitation.   Date last known well: 12/27/2011  Time last known well: 0900  tPA Given: No: patient had stroke 11/2011, too early within time frame for administration of tPA    He was admitted to the neuro floor for further evaluation and treatment.  SUBJECTIVE  He is sitting up in bed. Recognizes Dr. Pearlean Brownie, but cannot name him. Son not in room with him. Left arm weakness persists. Patient has no other complaints.  OBJECTIVE Most recent Vital Signs: Filed Vitals:   12/28/11 0244 12/28/11 0615 12/28/11 0640 12/28/11 1045  BP: 190/78 222/97 189/89 186/72  Pulse: 52 68  56  Temp: 97.7 F (36.5 C) 97.1 F (36.2 C)  98.2 F (36.8 C)  TempSrc: Oral Oral  Oral  Resp: 16 18  18   Height:      Weight:      SpO2: 100%   97%   CBG (last 3)   Basename 12/27/11 2311 12/27/11 1353  GLUCAP 85 82    IV Fluid Intake:     MEDICATIONS    . allopurinol  300 mg Oral q morning - 10a  . amLODipine  10 mg Oral Daily  . aspirin  300 mg Rectal Daily   Or  . aspirin  325 mg Oral Daily  . Chlorhexidine Gluconate Cloth  6 each Topical Q0600  . clopidogrel  75 mg Oral q morning - 10a  . divalproex  250 mg Oral BID  . enoxaparin (LOVENOX) injection  30 mg Subcutaneous Q24H  . escitalopram  10 mg Oral q morning - 10a  . meclizine  25 mg Oral BID  . mupirocin ointment  1 application Nasal BID  . simvastatin  40 mg Oral  QHS   PRN:  acetaminophen, acetaminophen, hydrALAZINE, ondansetron (ZOFRAN) IV, senna-docusate  Diet:  Cardiac thin liquids Activity:  Fall precautions DVT Prophylaxis:  lovenox  CLINICALLY SIGNIFICANT STUDIES Basic Metabolic Panel:  Lab 12/27/11 9147 12/27/11 1454 12/27/11 1438  NA -- 141 137  K -- 4.4 4.2  CL -- 104 101  CO2 -- -- 28  GLUCOSE -- 92 95  BUN -- 29* 27*  CREATININE 1.44* 1.90* --  CALCIUM -- -- 9.4  MG -- -- --  PHOS -- -- --   Liver Function Tests:  Lab 12/27/11 1438  AST 18  ALT 7  ALKPHOS 50  BILITOT 0.4  PROT 6.9  ALBUMIN 3.0*   CBC:  Lab 12/27/11 1816 12/27/11 1454 12/27/11 1438  WBC 3.2* -- 3.3*  NEUTROABS -- -- 1.7  HGB 12.8* 13.9 --  HCT 41.6 41.0 --  MCV 89.3 -- 89.1  PLT 143* -- 143*   Coagulation:  Lab 12/27/11 1438  LABPROT 14.0  INR 1.09   Cardiac Enzymes:  Lab 12/27/11 1438  CKTOTAL --  CKMB --  CKMBINDEX --  TROPONINI <0.30   Urinalysis:  Lab 12/27/11 1401  COLORURINE AMBER*  LABSPEC  1.026  PHURINE 6.0  GLUCOSEU NEGATIVE  HGBUR NEGATIVE  BILIRUBINUR SMALL*  KETONESUR 15*  PROTEINUR NEGATIVE  UROBILINOGEN 1.0  NITRITE NEGATIVE  LEUKOCYTESUR NEGATIVE   Lipid Panel    Component Value Date/Time   CHOL 171 12/28/2011 0435   TRIG 89 12/28/2011 0435   HDL 53 12/28/2011 0435   CHOLHDL 3.2 12/28/2011 0435   VLDL 18 12/28/2011 0435   LDLCALC 100* 12/28/2011 0435   HgbA1C  Lab Results  Component Value Date   HGBA1C 5.6 12/28/2011    Urine Drug Screen:     Component Value Date/Time   LABOPIA NONE DETECTED 12/27/2011 1401   COCAINSCRNUR NONE DETECTED 12/27/2011 1401   LABBENZ NONE DETECTED 12/27/2011 1401   AMPHETMU NONE DETECTED 12/27/2011 1401   THCU NONE DETECTED 12/27/2011 1401   LABBARB NONE DETECTED 12/27/2011 1401    Alcohol Level:  Lab 12/27/11 1438  ETH <11    Dg Chest 2 View 12/28/2011 Relatively stable post therapy exam as detailed above.    Ct Head Wo Contrast 12/27/2011 Stable findings as  above, no acute intracranial finding.  Mild sinusitis.    MRI HEAD WITHOUT CONTRAST  2 cm acute infarct of the right motor strip and surrounding white matter.  No mass effect or hemorrhage. Interval expected evolution of the small nearby right corona radiata infarct from November.     MRA HEAD WITHOUT CONTRAST  Stable intracranial MRA since November. No right MCA focal stenosis or major branch occlusion.     2D Echocardiogram  EF 55%, normal wall motion, LA mild dilation  Carotid Doppler    CXR  Post-therapy changes right midlung appears similar to the recent chest x-ray.  EKG  normal sinus rhythm.   Therapy Recommendations SNF   Physical Exam    Mental Status:  Alert, oriented to place but states that his age is 76, son's age is 26.diminished attention, registration and recall. Speech is hypophonic and slightly dysarthric. Able to follow 3 step commands without difficulty.  No extinction.  Cranial Nerves:  II: visual fields grossly normal, pupils equal, round, reactive to light and accommodation  III,IV, VI: ptosis not present, extra-ocular motions intact bilaterally  V,VII: smile symmetric though may have a small lag on the left, facial pin sensation normal bilaterally  VIII: hearing normal bilaterally  IX,X: uvula elevates symmetrically  XI: trapezius strength/neck flexion strength normal bilaterally  XII: tongue strength normal  Motor:  Right : Upper extremity 5/5 Left: Upper extremity 3/5, worse distally than proximally.  Lower extremity 5/5 Lower extremity 5/5  Tone and bulk:normal tone throughout; atrophy noted throughout  Sensory: Pinprick and light touch intact throughout, bilaterally  Plantars:  Right: downgoing Left: downgoing  Cerebellar:  normal finger-to-nose on right. Patients gait not tested due to acute nature of the evaluation    ASSESSMENT [name] is a 78 y.o. male presenting with left sided hemiparesis. Imaging confirms a 2 cm acute infarct of  the right motor strip and surrounding white matter.  Infarct felt to be embolic, work up underway. On aspirin 81 mg orally every day and clopidogrel 75 mg orally every day prior to admission. Now on aspirin 325 mg orally every day and clopidogrel 75 mg orally every day for secondary stroke prevention. Patient with resultant left hemiparesis.   Lung cancer  Hypertension  Hyperlipidemia, on zocor 40mg  daily  Coronary artery disease, reported history of previous stent placement  Dementia  Long term medication use  Hospital day # 1  TREATMENT/PLAN  Continue aspirin 325 mg orally every day and clopidogrel 75 mg orally every day for secondary stroke prevention.  Risk factor modifications  LDL 100. Goal < 100. On zocor 40mg  daily. Continue same dose at age 42. Albumin 3.0 consistent with protein calorie malnutrition, therefore will not increase today.  Carotid dopplers pending  SNF recommended  Gwendolyn Lima. Manson Passey, [facility], MBA, MHA [facility] Pager: [phone] 12/28/2011 3:44 PM   I have personally obtained a history, examined the patient, evaluated imaging results, and formulated the assessment and plan of care. I agree with the above.  Delia Heady, MD Medical Director [facility] Pager: [phone] 12/28/2011 6:38 PM

## 2011-12-28 NOTE — Evaluation (Signed)
Speech Language Pathology Evaluation Patient Details Name: Christopher Cook MRN: [id number] DOB: 08-11-1921 Today's Date: 12/28/2011 Time: 4098-1191 SLP Time Calculation (min): 16 min  Problem List:  Patient Active Problem List  Diagnosis  . Parkinson disease  . Gout  . Malignant neoplasm of bronchus and lung, unspecified site  . TIA (transient ischemic attack)  . Altered mental status  . Left arm weakness  . Accelerated hypertension  . AKI (acute kidney injury)  . Dehydration   Past Medical History:  Past Medical History  Diagnosis Date  . Lung cancer dx'd 2006    surg only  . Hypertension   . Stroke   . Parkinson disease   . Coronary artery disease   . Gout   . Dementia   . Angina   . Shortness of breath   . TIA (transient ischemic attack)   . Renal disorder Chronic Kidney disease   Past Surgical History:  Past Surgical History  Procedure Date  . Lasik   . Coronary angioplasty with stent placement   . Lobectomy    HPI:  78 yo male s/p 2 cm acute infarct of the right motor strip that has a hx of Parkinson's, dementia, and previous CVA.    Assessment / Plan / Recommendation Clinical Impression  Patient presents with cognitive-linguistic deficits impacting attention, awareness, problem solving, judgement, and verbal expression however based on chart review and history, suspect that these are largely very close to baseline. No acute f/u indicated. May benefit from f/u SLP evaluation with return to SNF.     SLP Assessment  Patient does not need any further Speech Lanaguage Pathology Services    Follow Up Recommendations  Skilled Nursing facility       Pertinent Vitals/Pain n/a       SLP Evaluation Prior Functioning  Cognitive/Linguistic Baseline: Baseline deficits Baseline deficit details: baseline dementia, ? dysarthria from previous CVA Available Help at Discharge: Other (Comment) ([facility]) Vocation: Retired   IT consultant  Overall  Cognitive Status: Impaired Arousal/Alertness: Awake/alert Orientation Level: Oriented to person;Oriented to place;Oriented to situation Attention: Focused;Sustained Focused Attention: Appears intact Sustained Attention: Impaired Sustained Attention Impairment: Verbal complex;Functional complex Memory: Impaired Memory Impairment: Storage deficit;Retrieval deficit;Decreased recall of new information;Decreased short term memory Decreased Short Term Memory: Verbal basic;Functional basic Awareness: Impaired Awareness Impairment: Intellectual impairment;Emergent impairment Problem Solving: Impaired Problem Solving Impairment: Verbal complex;Functional basic Safety/Judgment: Impaired (verbally)    Comprehension  Auditory Comprehension Overall Auditory Comprehension: Appears within functional limits for tasks assessed (for basic 1 step directions) Reading Comprehension Reading Status: Not tested    Expression Expression Primary Mode of Expression: Verbal Verbal Expression Overall Verbal Expression: Impaired Other Verbal Expression Comments: suspect impaired at baseline; see clinical impressions Written Expression Dominant Hand: Right   Oral / Motor Oral Motor/Sensory Function Overall Oral Motor/Sensory Function: Appears within functional limits for tasks assessed Motor Speech Overall Motor Speech: Appears within functional limits for tasks assessed (possible mild baseline dysarthria)   GO   Ferdinand Lango MA, CCC-SLP [phone]   Waldon Sheerin Meryl 12/28/2011, 6:02 PM

## 2011-12-28 NOTE — Progress Notes (Signed)
  Echocardiogram 2D Echocardiogram has been performed.  Christopher Cook 12/28/2011, 2:49 PM

## 2011-12-28 NOTE — Progress Notes (Signed)
Patient ID: Christopher Cook  male  JYN:[id number]    DOB: September 07, 1921    DOA: 12/27/2011  PCP: Pola Corn, MD  Assessment/Plan: Principal Problem:  Acute 2 cm CVA of the right motor strip: With the left arm weakness:  recent subacute small infarct posterior right corona radiata in 11/2011, ?embolic  - 2-D echocardiogram pending -  carotid Dopplers were done in 11 2013 showed no significant extra cranial carotid artery stenosis  - Continue aspirin, Plavix, statin,  - PT evaluation recommending skilled nursing facility, patient is a resident of [facility] assisted-[facility] facility currently  - Neurology following  Active Problems:  Accelerated hypertension:  - Placed on Norvasc 10 mg daily, DC IV fluids - Continue hydralazine IV PRN with parameters. The patient is not on any antihypertensives outpatient   AKI (acute kidney injury), dehydration with ketonuria  - Baseline creatinine 1.4,  KVO  DVT prophylaxis: lovenox   CODE STATUS: full code   Disposition: Updated Child psychotherapist regarding skilled nursing facility    Subjective: No improvement in the left upper arm weakness, otherwise patient denies any complaints  Objective: Weight change:  No intake or output data in the 24 hours ending 12/28/11 1354 Blood pressure 186/72, pulse 56, temperature 98.2 F (36.8 C), temperature source Oral, resp. rate 18, height 5\' 8"  (1.727 m), weight 65.772 kg (145 lb), SpO2 97.00%.  Physical Exam: General: Alert and awake, oriented x3, NAD CVS: S1-S2 clear, no murmur rubs or gallops Chest: CTAB Abdomen: soft NT, ND, NBS Extremities: no c/c./e bilaterally Neuro: Left upper arm weakness 2-3/5  Lab Results: Basic Metabolic Panel:  Lab 12/27/11 8657 12/27/11 1454 12/27/11 1438  NA -- 141 137  K -- 4.4 4.2  CL -- 104 101  CO2 -- -- 28  GLUCOSE -- 92 95  BUN -- 29* 27*  CREATININE 1.44* 1.90* --  CALCIUM -- -- 9.4  MG -- -- --  PHOS -- -- --   Liver Function Tests:  Lab  12/27/11 1438  AST 18  ALT 7  ALKPHOS 50  BILITOT 0.4  PROT 6.9  ALBUMIN 3.0*   No results found for this basename: LIPASE:2,AMYLASE:2 in the last 168 hours No results found for this basename: AMMONIA:2 in the last 168 hours CBC:  Lab 12/27/11 1816 12/27/11 1454 12/27/11 1438  WBC 3.2* -- 3.3*  NEUTROABS -- -- 1.7  HGB 12.8* 13.9 --  HCT 41.6 41.0 --  MCV 89.3 -- 89.1  PLT 143* -- 143*   Cardiac Enzymes:  Lab 12/27/11 1438  CKTOTAL --  CKMB --  CKMBINDEX --  TROPONINI <0.30   BNP: No components found with this basename: POCBNP:2 CBG:  Lab 12/27/11 2311 12/27/11 1353  GLUCAP 85 82     Micro Results: Recent Results (from the past 240 hour(s))  MRSA PCR SCREENING     Status: Abnormal   Collection Time   12/27/11  8:42 PM      Component Value Range Status Comment   MRSA by PCR POSITIVE (*) NEGATIVE Final     Studies/Results: Dg Chest 2 View  12/28/2011  *RADIOLOGY REPORT*  Clinical Data: Stroke.  Cough.  Lung cancer.  CHEST - 2 VIEW  Comparison: 11/18/2011 chest x-ray.  09/19/2011 chest CT.  Findings: Post-therapy changes right midlung appears similar to the recent chest x-ray.  Postsurgical changes left hilar region stable.  Nodularity posterior aspect of the left sixth rib unchanged probably representing confluence of shadows rather than underlying pulmonary nodule.  Attention to this  on follow-up.  Persistent elevation left hemidiaphragm and mediastinal shift to the left.  Mild central pulmonary vascular prominence without pulmonary edema. No segmental consolidation.  IMPRESSION: Relatively stable post therapy exam as detailed above.   Original Report Authenticated By: Lacy Duverney, M.D.    Ct Head Wo Contrast  12/27/2011  *RADIOLOGY REPORT*  Clinical Data: Code stroke, left arm paralysis  CT HEAD WITHOUT CONTRAST  Technique:  Contiguous axial images were obtained from the base of the skull through the vertex without contrast.  Comparison: MRI 11/18/2011 and head  CT 11/18/2011  Findings: Marked diffuse cortical volume loss persists with proportional ventricular prominence.  Left frontoparietal encephalomalacia again noted.  Remote right caudate and bilateral external capsule lacunar infarcts are noted.  No midline shift.  No acute hemorrhage, acute infarction, or mass lesion is seen.  Mild ethmoid and left maxillary mucoperiosteal thickening.  IMPRESSION: Stable findings as above, no acute intracranial finding.  Mild sinusitis.  Results called to Dr. Amada Jupiter by Dr. Chilton Si on 12/27/2011 at 1:09 p.m.   Original Report Authenticated By: Christiana Pellant, M.D.    Mr Brain Wo Contrast  12/28/2011  *RADIOLOGY REPORT*  Clinical Data:  78 year old male with prior stroke.  Left upper extremity weakness.  Comparison: Brain MRI and MRA 11/18/2011 and earlier.  MRI HEAD WITHOUT CONTRAST  Technique: Multiplanar, multiecho pulse sequences of the brain and surrounding structures were obtained according to standard protocol without intravenous contrast.  Findings: Approximately 2 cm confluent cortically based infarct at the right superior frontal gyrus, motor strip.  Subcortical white matter extension.  Occasional other scattered small foci of ischemia in the nearby right parietal lobe .  Mild associated cytotoxic edema.  No mass effect or hemorrhage.  Faint residual diffusion abnormality at the site of the smaller November white matter infarct.  Diffusion elsewhere is within normal limits.  Major intracranial vascular flow voids are stable.  Stable gray and white matter signal elsewhere.  Remote superior left MCA infarct with encephalomalacia, a similar sized the today's lesion.  Chronic small vessel disease in the pons.  Stable visualized cervical spine, negative except for evidence of previous ACDF beginning at C3.  Normal bone marrow signal. Stable ventricle size and configuration.  Negative pituitary and cervicomedullary junction.  Stable orbit soft tissues.  Decreased mild  paranasal sinus mucosal thickening.  Mastoids are clear.  Negative scalp soft tissues.  IMPRESSION: 1.  2 cm acute infarct of the right motor strip and surrounding white matter.  No mass effect or hemorrhage. 2.  Interval expected evolution of the small nearby right corona radiata infarct from November.  Underlying chronic ischemia. 3.  MRA findings are below.  MRA HEAD WITHOUT CONTRAST  Technique: Angiographic images of the Circle of Willis were obtained using MRA technique without  intravenous contrast.  Findings: Stable antegrade flow in the posterior circulation supplied by the distal right vertebral artery.  Preserved bilateral PICA flow.  No significant flow in the distal left vertebral artery, unchanged and on the previous study appeared related to functional termination of that vessel in the left PICA.  Stable basilar artery without stenosis.  SCA and PCA origins are patent and stable.  Fetal type left PCA origin.  Visualized bilateral PCA branches are stable.  Motion artifact affecting the ICA siphons.  Stable antegrade flow in the anterior circulation.  No definite ICA stenosis.  Carotid termini remain patent.  MCA and ACA origins are stable.  Anterior communicating artery is diminutive.  Visualized bilateral ACA branches are  stable and within normal limits.  The visualized bilateral MCA branches are stable with mild irregularity compatible with atherosclerosis.  IMPRESSION:  Stable intracranial MRA since November. No right MCA focal stenosis or major branch occlusion.   Original Report Authenticated By: Erskine Speed, M.D.    Mr Mra Head/brain Wo Cm  12/28/2011  *RADIOLOGY REPORT*  Clinical Data:  78 year old male with prior stroke.  Left upper extremity weakness.  Comparison: Brain MRI and MRA 11/18/2011 and earlier.  MRI HEAD WITHOUT CONTRAST  Technique: Multiplanar, multiecho pulse sequences of the brain and surrounding structures were obtained according to standard protocol without intravenous  contrast.  Findings: Approximately 2 cm confluent cortically based infarct at the right superior frontal gyrus, motor strip.  Subcortical white matter extension.  Occasional other scattered small foci of ischemia in the nearby right parietal lobe .  Mild associated cytotoxic edema.  No mass effect or hemorrhage.  Faint residual diffusion abnormality at the site of the smaller November white matter infarct.  Diffusion elsewhere is within normal limits.  Major intracranial vascular flow voids are stable.  Stable gray and white matter signal elsewhere.  Remote superior left MCA infarct with encephalomalacia, a similar sized the today's lesion.  Chronic small vessel disease in the pons.  Stable visualized cervical spine, negative except for evidence of previous ACDF beginning at C3.  Normal bone marrow signal. Stable ventricle size and configuration.  Negative pituitary and cervicomedullary junction.  Stable orbit soft tissues.  Decreased mild paranasal sinus mucosal thickening.  Mastoids are clear.  Negative scalp soft tissues.  IMPRESSION: 1.  2 cm acute infarct of the right motor strip and surrounding white matter.  No mass effect or hemorrhage. 2.  Interval expected evolution of the small nearby right corona radiata infarct from November.  Underlying chronic ischemia. 3.  MRA findings are below.  MRA HEAD WITHOUT CONTRAST  Technique: Angiographic images of the Circle of Willis were obtained using MRA technique without  intravenous contrast.  Findings: Stable antegrade flow in the posterior circulation supplied by the distal right vertebral artery.  Preserved bilateral PICA flow.  No significant flow in the distal left vertebral artery, unchanged and on the previous study appeared related to functional termination of that vessel in the left PICA.  Stable basilar artery without stenosis.  SCA and PCA origins are patent and stable.  Fetal type left PCA origin.  Visualized bilateral PCA branches are stable.  Motion  artifact affecting the ICA siphons.  Stable antegrade flow in the anterior circulation.  No definite ICA stenosis.  Carotid termini remain patent.  MCA and ACA origins are stable.  Anterior communicating artery is diminutive.  Visualized bilateral ACA branches are stable and within normal limits.  The visualized bilateral MCA branches are stable with mild irregularity compatible with atherosclerosis.  IMPRESSION:  Stable intracranial MRA since November. No right MCA focal stenosis or major branch occlusion.   Original Report Authenticated By: Erskine Speed, M.D.     Medications: Scheduled Meds:   . allopurinol  300 mg Oral q morning - 10a  . aspirin  300 mg Rectal Daily   Or  . aspirin  325 mg Oral Daily  . Chlorhexidine Gluconate Cloth  6 each Topical Q0600  . clopidogrel  75 mg Oral q morning - 10a  . divalproex  250 mg Oral BID  . enoxaparin (LOVENOX) injection  30 mg Subcutaneous Q24H  . escitalopram  10 mg Oral q morning - 10a  . meclizine  25 mg Oral BID  . mupirocin ointment  1 application Nasal BID  . simvastatin  40 mg Oral QHS      LOS: 1 day   Rawlins Stuard M.D. [facility] 12/28/2011, 1:54 PM Pager: [phone]  If 7PM-7AM, please contact night-coverage [email or web address] Password [email or web address]

## 2011-12-28 NOTE — Evaluation (Signed)
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  Pt presents with new onset deficits in functional mobility secondary to weakness, decreased activity tolerance and sensory deficits.  Pt will benefit from acute PT to address deficits.      PT Assessment  Patient needs continued PT services    Follow Up Recommendations  SNF;Supervision/Assistance - 24 hour          Equipment Recommendations  None recommended by PT    Recommendations for Other Services     Frequency Min 3X/week    Precautions / Restrictions Precautions Precautions: Fall Restrictions Weight Bearing Restrictions: No         Mobility  Bed Mobility Bed Mobility: Supine to Sit;Sitting - Scoot to Edge of Bed Supine to Sit: 3: Mod assist;HOB elevated;With rails Sitting - Scoot to Edge of Bed: 2: Max assist Transfers Transfers: Sit to Stand;Stand to Sit Sit to Stand: 1: +2 Total assist;With upper extremity assist;From bed Sit to Stand: Patient Percentage: 50% Stand to Sit: 1: +2 Total assist;To chair/3-in-1;With upper extremity assist Stand to Sit: Patient Percentage: 50% Details for Transfer Assistance: pt with posterior lean with static standing Ambulation/Gait Ambulation/Gait Assistance: 1: +2 Total assist Ambulation/Gait: Patient Percentage: 60% Ambulation Distance (Feet): 4 Feet Assistive device: 2 person hand held assist           PT Diagnosis: Generalized weakness;Abnormality of gait;Difficulty walking  PT Problem List: Decreased strength;Decreased activity tolerance;Decreased balance;Decreased mobility;Decreased coordination;Decreased safety awareness;Impaired sensation PT Treatment Interventions: DME  instruction;Gait training;Functional mobility training;Therapeutic activities;Therapeutic exercise;Patient/family education   PT Goals Acute Rehab PT Goals PT Goal Formulation: With patient Time For Goal Achievement: 01/02/12 Potential to Achieve Goals: Fair Pt will go Supine/Side to Sit: with modified independence PT Goal: Supine/Side to Sit - Progress: Goal set today Pt will go Sit to Stand: with modified independence PT Goal: Sit to Stand - Progress: Goal set today Pt will go Stand to Sit: with modified independence PT Goal: Stand to Sit - Progress: Goal set today Pt will Ambulate: >150 feet;with modified independence PT Goal: Ambulate - Progress: Progressing toward goal  Visit Information  Last PT Received On: 12/28/11 Assistance Needed: +2    Subjective Data  Subjective: pt pleasent and agreeable to therapy Patient Stated Goal: to move   Prior Functioning  Home Living Available Help at Discharge: Other (Comment) ([facility]) Prior Function Level of Independence: Needs assistance Needs Assistance: Bathing;Dressing;Feeding;Grooming;Toileting;Meal Prep;Gait;Transfers Bath: Total Dressing: Total Feeding: Moderate Grooming: Maximal Toileting: Moderate Meal Prep: Total Gait Assistance: PT completes basic transfer sit<>Stand from w/c to toilet per facility Able to Take Stairs?: No Driving: No Vocation: Retired Musician:  (mumbles with speech) Dominant Hand: Right    Cognition  Overall Cognitive Status: Impaired Area of Impairment: Memory;Following commands;Awareness of deficits Arousal/Alertness: Awake/alert Orientation Level: Disoriented to;Time;Situation;Place Behavior During Session: [facility] for tasks performed Memory: Decreased recall of precautions Following Commands: Follows one step commands consistently    Extremity/Trunk Assessment Right Upper Extremity Assessment RUE ROM/Strength/Tone: Within functional levels RUE  Sensation: WFL - Light Touch RUE Coordination: WFL - gross/fine motor Left Upper Extremity Assessment LUE ROM/Strength/Tone: Deficits LUE ROM/Strength/Tone Deficits: brunstrom III , gross grip strength 3 out 5 unabel to maintain grasp  LUE Sensation: Deficits LUE Sensation Deficits: reports numbness LUE Coordination: Deficits Trunk Assessment Trunk Assessment: Normal   Balance    End of Session PT - End of Session Equipment Utilized During Treatment: Gait belt Activity Tolerance: Patient tolerated treatment well;Patient limited by fatigue Patient left: in chair;with call bell/phone within reach;with chair alarm set;with family/visitor present Nurse Communication: Mobility status  GP     Fabio Asa 12/28/2011, 4:04 PM Charlotte Crumb, PT DPT  [phone]

## 2011-12-28 NOTE — Evaluation (Signed)
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 OT to follow acutely. Recommend Snf for d/c planning. Pt can return to [facility] if (A) can be provided. Pt was total A) with adls prior.     OT Assessment  Patient needs continued OT Services    Follow Up Recommendations  SNF    Barriers to Discharge      Equipment Recommendations  3 in 1 bedside comode;Wheelchair (measurements OT);Wheelchair cushion (measurements OT)    Recommendations for Other Services    Frequency  Min 2X/week    Precautions / Restrictions Precautions Precautions: Fall Restrictions Weight Bearing Restrictions: No   Pertinent Vitals/Pain None to report at this time    ADL  Eating/Feeding: Minimal assistance ((A) with LT hand for items that need two hands) Where Assessed - Eating/Feeding: Chair Grooming: Wash/dry face;Min guard Where Assessed - Grooming: Supported sitting Upper Body Bathing: Chest;Right arm;Left arm;Abdomen;Moderate assistance Where Assessed - Upper Body Bathing: Supported sitting Lower Body Bathing: Moderate assistance Where Assessed - Lower Body Bathing: Supported sitting Upper Body Dressing: Minimal assistance Where Assessed - Upper Body Dressing: Supported sitting Lower Body Dressing: +1 Total assistance Where Assessed - Lower Body Dressing: Supine, head of bed up Toilet Transfer: +2 Total assistance Toilet Transfer: Patient Percentage: 50% Equipment Used: Gait belt (hand held (A)) Transfers/Ambulation Related to ADLs: pt required mod v/c and facilitation for forward hip flexion and pt unsteady balance for right pivot transfer ADL Comments: Pt incontinent on arrival and foley d/ced in  sheets. Rn Kim notified. Pt provided wash cloth and mod (A) to wash off urine. Pt completed adl with min v/c and min (A) for sitting balance. Pt able to maintain stand by assistance for ~45 seconds.    OT Diagnosis: Generalized weakness;Cognitive deficits;Hemiplegia non-dominant side  OT Problem List: Decreased strength;Decreased activity tolerance;Impaired balance (sitting and/or standing);Decreased safety awareness;Decreased knowledge of use of DME or AE;Decreased knowledge of precautions;Impaired UE functional use OT Treatment Interventions: Self-care/ADL training;DME and/or AE instruction;Therapeutic activities;Patient/family education;Balance training   OT Goals Acute Rehab OT Goals OT Goal Formulation: With patient Time For Goal Achievement: 01/11/12 Potential to Achieve Goals: Good ADL Goals Pt Will Perform Grooming: with set-up;Sitting, chair;Supported ADL Goal: Grooming - Progress: Goal set today Pt Will Transfer to Toilet: with mod assist;Ambulation;with DME;3-in-1 ADL Goal: Statistician - Progress: Goal set today Miscellaneous OT Goals Miscellaneous OT Goal #1: Pt will complete bed mobility Min guard (A) as precursor to adls OT Goal: Miscellaneous Goal #1 - Progress: Goal set today  Visit Information  Last OT Received On: 12/28/11 Assistance Needed: +2    Subjective Data  Subjective: "thats my granddaughter"- pt recognizes family Patient Stated Goal: to eat lunch   Prior Functioning     Home Living Available Help at Discharge: Other (Comment) ([facility]) Prior Function Level of Independence: Needs assistance Needs Assistance: Bathing;Dressing;Feeding;Grooming;Toileting;Meal Prep;Gait;Transfers Bath: Total Dressing: Total Feeding: Moderate Grooming: Maximal Toileting: Moderate Meal Prep: Total Gait Assistance: PT completes basic transfer sit<>Stand from w/c to toilet per facility Able to Take Stairs?: No Driving: No Vocation:  Retired Musician:  (mumbles with speech) Dominant Hand: Right         Vision/Perception     Cognition  Overall Cognitive Status: Impaired Area of Impairment: Memory;Following commands;Awareness of deficits Arousal/Alertness: Awake/alert Orientation Level: Disoriented to;Time;Situation;Place Behavior During Session: [facility] for tasks performed Memory: Decreased recall of precautions Following Commands: Follows one step commands consistently    Extremity/Trunk Assessment Right Upper Extremity Assessment RUE ROM/Strength/Tone: Within functional levels RUE Sensation: WFL - Light Touch RUE Coordination: WFL - gross/fine motor Left Upper Extremity Assessment LUE ROM/Strength/Tone: Deficits LUE ROM/Strength/Tone Deficits: brunstrom III , gross grip strength 3 out 5 unabel to maintain grasp LUE Sensation: Deficits LUE Sensation Deficits: reports numbness LUE Coordination: Deficits Trunk Assessment Trunk Assessment: Normal     Mobility Bed Mobility Bed Mobility: Supine to Sit;Sitting - Scoot to Edge of Bed Supine to Sit: 3: Mod assist;HOB elevated;With rails Sitting - Scoot to Edge of Bed: 2: Max assist Transfers Transfers: Sit to Stand;Stand to Sit Sit to Stand: 1: +2 Total assist;With upper extremity assist;From bed Sit to Stand: Patient Percentage: 50% Stand to Sit: 1: +2 Total assist;To chair/3-in-1;With upper extremity assist Stand to Sit: Patient Percentage: 50% Details for Transfer Assistance: pt with posterior lean with static standing     Shoulder Instructions     Exercise     Balance     End of Session OT - End of Session Activity Tolerance: Patient tolerated treatment well Patient left: in chair;with call bell/phone within reach;with family/visitor present Nurse Communication: Mobility status;Precautions  GO     Lucile Shutters 12/28/2011, 1:35 PM Pager: [phone]

## 2011-12-29 ENCOUNTER — Inpatient Hospital Stay (HOSPITAL_COMMUNITY): Payer: Medicare Other

## 2011-12-29 DIAGNOSIS — R569 Unspecified convulsions: Secondary | ICD-10-CM

## 2011-12-29 LAB — BASIC METABOLIC PANEL
BUN: 21 mg/dL (ref 6–23)
Chloride: 102 mEq/L (ref 96–112)
GFR calc Af Amer: 63 mL/min — ABNORMAL LOW (ref >90)
GFR calc non Af Amer: 54 mL/min — ABNORMAL LOW (ref >90)
Potassium: 4.1 mEq/L (ref 3.5–5.1)

## 2011-12-29 MED ORDER — HYDRALAZINE HCL 50 MG PO TABS: 50.0000 mg | ORAL_TABLET | Freq: Two times a day (BID) | ORAL | Status: DC

## 2011-12-29 MED ORDER — HYDRALAZINE HCL 50 MG PO TABS
50.0000 mg | ORAL_TABLET | Freq: Two times a day (BID) | ORAL | Status: DC
  Administered 2011-12-29 – 2012-01-01 (×5): 50 mg via ORAL
  Filled 2011-12-29 (×8): qty 1

## 2011-12-29 MED ORDER — LEVETIRACETAM 500 MG PO TABS
500.0000 mg | ORAL_TABLET | Freq: Two times a day (BID) | ORAL | Status: DC
  Administered 2011-12-29 – 2011-12-30 (×2): 500 mg via ORAL
  Filled 2011-12-29 (×3): qty 1

## 2011-12-29 MED ORDER — SODIUM CHLORIDE 0.9 % IV SOLN
1000.0000 mg | Freq: Once | INTRAVENOUS | Status: AC
  Administered 2011-12-30: 1000 mg via INTRAVENOUS
  Filled 2011-12-29: qty 10

## 2011-12-29 MED ORDER — AMLODIPINE BESYLATE 10 MG PO TABS: 10.0000 mg | ORAL_TABLET | Freq: Every day | ORAL | Status: DC

## 2011-12-29 NOTE — Progress Notes (Signed)
Stroke Team Progress Note  HISTORY  Chief Complaint: left arm weakness   HPI: Christopher Cook is an 78 y.o. male who was LKW at 0900 was eating lunch later with son who noticed left arm paralysis. EMS called. Patient has dementia and Parkinsons disease. Has been living in [facility] for past couple of years. Last admitted to [facility] in November 2013. At that time he was admitted slurred speech, weakness, confusion (has dementia) with subacute small infarct posterior right corona radiata on scan. On plavix and baby aspirin as outpatient. Patient has been on depakote but this is for agitation.   Date last known well: 12/27/2011  Time last known well: 0900  tPA Given: No: patient had stroke 11/2011, too early within time frame for administration of tPA    He was admitted to the neuro floor for further evaluation and treatment.  SUBJECTIVE  Patient sitting up in bed. Continues to have left arm weakness. Follows commands. Communicates no other complaints.he came from assisted living facility and it is unclear if he will be able to go back to the same facility or  will need transfer to a skilled nursing facility.  OBJECTIVE Most recent Vital Signs: Filed Vitals:   12/28/11 1804 12/28/11 2202 12/29/11 0200 12/29/11 0600  BP: 145/90 133/67 140/64 195/86  Pulse: 67 64 60 66  Temp: 98.3 F (36.8 C) 98.3 F (36.8 C) 98 F (36.7 C) 97.4 F (36.3 C)  TempSrc: Oral     Resp: 18 18 18 18   Height:      Weight:      SpO2: 98% 99% 97% 93%   CBG (last 3)   Basename 12/27/11 2311 12/27/11 1353  GLUCAP 85 82    IV Fluid Intake:     MEDICATIONS     . allopurinol  300 mg Oral q morning - 10a  . amLODipine  10 mg Oral Daily  . aspirin  300 mg Rectal Daily   Or  . aspirin  325 mg Oral Daily  . Chlorhexidine Gluconate Cloth  6 each Topical Q0600  . clopidogrel  75 mg Oral q morning - 10a  . divalproex  250 mg Oral BID  . enoxaparin (LOVENOX) injection  30 mg Subcutaneous Q24H  .  escitalopram  10 mg Oral q morning - 10a  . meclizine  25 mg Oral BID  . mupirocin ointment  1 application Nasal BID  . simvastatin  40 mg Oral QHS   PRN:  acetaminophen, acetaminophen, hydrALAZINE, ondansetron (ZOFRAN) IV, senna-docusate  Diet:  Cardiac thin liquids Activity:  Fall precautions DVT Prophylaxis:  lovenox  CLINICALLY SIGNIFICANT STUDIES Basic Metabolic Panel:   Lab 12/27/11 1816 12/27/11 1454 12/27/11 1438  NA -- 141 137  K -- 4.4 4.2  CL -- 104 101  CO2 -- -- 28  GLUCOSE -- 92 95  BUN -- 29* 27*  CREATININE 1.44* 1.90* --  CALCIUM -- -- 9.4  MG -- -- --  PHOS -- -- --   Liver Function Tests:   Lab 12/27/11 1438  AST 18  ALT 7  ALKPHOS 50  BILITOT 0.4  PROT 6.9  ALBUMIN 3.0*   CBC:   Lab 12/27/11 1816 12/27/11 1454 12/27/11 1438  WBC 3.2* -- 3.3*  NEUTROABS -- -- 1.7  HGB 12.8* 13.9 --  HCT 41.6 41.0 --  MCV 89.3 -- 89.1  PLT 143* -- 143*   Coagulation:   Lab 12/27/11 1438  LABPROT 14.0  INR 1.09   Cardiac Enzymes:  Lab 12/27/11 1438  CKTOTAL --  CKMB --  CKMBINDEX --  TROPONINI <0.30   Urinalysis:   Lab 12/27/11 1401  COLORURINE AMBER*  LABSPEC 1.026  PHURINE 6.0  GLUCOSEU NEGATIVE  HGBUR NEGATIVE  BILIRUBINUR SMALL*  KETONESUR 15*  PROTEINUR NEGATIVE  UROBILINOGEN 1.0  NITRITE NEGATIVE  LEUKOCYTESUR NEGATIVE   Lipid Panel    Component Value Date/Time   CHOL 171 12/28/2011 0435   TRIG 89 12/28/2011 0435   HDL 53 12/28/2011 0435   CHOLHDL 3.2 12/28/2011 0435   VLDL 18 12/28/2011 0435   LDLCALC 100* 12/28/2011 0435   HgbA1C  Lab Results  Component Value Date   HGBA1C 5.6 12/28/2011    Urine Drug Screen:     Component Value Date/Time   LABOPIA NONE DETECTED 12/27/2011 1401   COCAINSCRNUR NONE DETECTED 12/27/2011 1401   LABBENZ NONE DETECTED 12/27/2011 1401   AMPHETMU NONE DETECTED 12/27/2011 1401   THCU NONE DETECTED 12/27/2011 1401   LABBARB NONE DETECTED 12/27/2011 1401    Alcohol Level:   Lab  12/27/11 1438  ETH <11    Dg Chest 2 View 12/28/2011 Relatively stable post therapy exam as detailed above.    Ct Head Wo Contrast 12/27/2011 Stable findings as above, no acute intracranial finding.  Mild sinusitis.    MRI HEAD WITHOUT CONTRAST  2 cm acute infarct of the right motor strip and surrounding white matter.  No mass effect or hemorrhage. Interval expected evolution of the small nearby right corona radiata infarct from November.     MRA HEAD WITHOUT CONTRAST  Stable intracranial MRA since November. No right MCA focal stenosis or major branch occlusion.     2D Echocardiogram  EF 55%, normal wall motion, LA mild dilation  Carotid Doppler 11/19/2011  No significant extracranial carotid artery stenosis demonstrated. Right Vertebralis patent with antegrade flow. Left vertebral is atypical with loss of diastolic component which could be indicative of distal obstruction.  CXR  Post-therapy changes right midlung appears similar to the recent chest x-ray.  EKG  normal sinus rhythm.   Therapy Recommendations SNF   Physical Exam    Mental Status:  Alert, oriented to place but states that his age is 66, son's age is 26.diminished attention, registration and recall. Speech is hypophonic and slightly dysarthric. Able to follow 2 step commands without difficulty.  No extinction.  Cranial Nerves:  II: visual fields grossly normal, pupils equal, round, reactive to light and accommodation  III,IV, VI: ptosis not present, extra-ocular motions intact bilaterally  V,VII: smile symmetric though may have a small lag on the left, facial pin sensation normal bilaterally  VIII: hearing normal bilaterally  IX,X: uvula elevates symmetrically  XI: trapezius strength/neck flexion strength normal bilaterally  XII: tongue strength normal  Motor:  Right : Upper extremity 5/5 Left: Upper extremity 3/5, worse distally than proximally.  Lower extremity 5/5 Lower extremity 5/5  Tone and bulk:normal  tone throughout; atrophy noted throughout  Sensory: Pinprick and light touch intact throughout, bilaterally  Plantars:  Right: downgoing Left: downgoing  Cerebellar:  normal finger-to-nose on right. Patients gait not tested due to acute nature of the evaluation    ASSESSMENT [name] is a 78 y.o. male presenting with left sided hemiparesis. Imaging confirms a 2 cm acute infarct of the right motor strip and surrounding white matter.  Infarct felt to be secondary to small vessel disease. On aspirin 81 mg orally every day and clopidogrel 75 mg orally every day prior to admission. Now  on aspirin 325 mg orally every day and clopidogrel 75 mg orally every day for secondary stroke prevention. Patient with resultant left hemiparesis.   Lung cancer  Hypertension  Hyperlipidemia, on zocor 40mg  daily  Coronary artery disease, reported history of previous stent placement  Dementia  Long term medication use  Hospital day # 2  TREATMENT/PLAN  Continue aspirin 325 mg orally every day and clopidogrel 75 mg orally every day for secondary stroke prevention.  Risk factor modifications  LDL 100. Goal < 100. On zocor 40mg  daily. Continue same dose at age 80. Albumin 3.0 consistent with protein calorie malnutrition, therefore will not increase, especially with history of cancer. Can decrease dietary intake of high cholesterol intake. Have patient recheck in 6 weeks.  SNF recommended  Follow-up with Dr. Pearlean Brownie in 2 months or keep follow up appt if already scheduled.  Gwendolyn Lima. Manson Passey, [facility], MBA, MHA [facility] Pager: [phone] 12/29/2011 12:27 PM  I have personally obtained a history, examined the patient, evaluated imaging results, and formulated the assessment and plan of care. I agree with the above.    Delia Heady, MD Medical Director [facility] Pager: [phone] 12/29/2011 12:49 PM

## 2011-12-29 NOTE — Progress Notes (Signed)
Upon ambulation to bedside commode, patient presented with left upper quadrant eye deviation, mild tremors, and unresponsiveness.  Patient remained conscious but would not respond to commands.  MD notified of event.

## 2011-12-29 NOTE — Care Management Note (Signed)
    Page 1 of 1   01/01/2012     4:46:18 PM   CARE MANAGEMENT NOTE 01/01/2012  Patient:  Christopher Cook, Christopher Cook   Account Number:  [id number]  Date Initiated:  12/29/2011  Documentation initiated by:  Letha Cape  Subjective/Objective Assessment:   dx  admit- from[facility].     Action/Plan:   Anticipated DC Date:  01/02/2012   Anticipated DC Plan:  SKILLED NURSING FACILITY  In-house referral  Clinical Social Worker      DC Planning Services  CM consult      Choice offered to / List presented to:             Status of service:  Completed, signed off Medicare Important Message given?   (If response is "NO", the following Medicare IM given date fields will be blank) Date Medicare IM given:   Date Additional Medicare IM given:    Discharge Disposition:  SKILLED NURSING FACILITY  Per UR Regulation:  Reviewed for med. necessity/level of care/duration of stay  If discussed at Long Length of Stay Meetings, dates discussed:    Comments:  12/29/11 12:38 Letha Cape RN, BSN [phone] patient is from [facility], CSW following, may need alf vs snf.

## 2011-12-29 NOTE — Clinical Social Work Psychosocial (Signed)
     Clinical Social Work Department BRIEF PSYCHOSOCIAL ASSESSMENT 12/29/2011  Patient:  Christopher, Cook     Account Number:  [id number]     Admit date:  12/27/2011  Clinical Social Worker:  Peggyann Shoals  Date/Time:  12/28/2011 03:45 PM  Referred by:  Physician  Date Referred:  12/28/2011 Referred for  Other - See comment   Other Referral:   From a facility   Interview type:  Patient Other interview type:    PSYCHOSOCIAL DATA Living Status:  FACILITY Admitted from facility:  [facility] Level of care:  Assisted Living Primary support name:  Winford Fournet Primary support relationship to patient:  CHILD, ADULT Degree of support available:   supportive    CURRENT CONCERNS Current Concerns  Post-Acute Placement   Other Concerns:    SOCIAL WORK ASSESSMENT / PLAN CSW met with pt to address consult. CSW introduced herself and explaine role of social work. Pt is a resident of [facility]. Pt is agreeable to returning. CSW will follow up with facility regarding level of care as PT is recommeding SNF level of care.    CSW will follow up with pt's son. CSW will continue to follow.   Assessment/plan status:  Psychosocial Support/Ongoing Assessment of Needs Other assessment/ plan:   Information/referral to community resources:    PATIENTS/FAMILYS RESPONSE TO PLAN OF CARE: Pt was very pleasant and agreeable to returning to ALF at discharge.

## 2011-12-29 NOTE — Discharge Summary (Addendum)
Physician Discharge Summary  Patient ID: Christopher Cook MRN: [id number] DOB/AGE: [birth year] 76 y.o.  Admit date: 12/27/2011 Discharge date: 12/29/2011  Primary Care Physician:  Pola Corn, MD  Discharge Diagnoses:    . Left arm weakness Acute 2 cm CVA of the right motor strip . Accelerated hypertension . AKI (acute kidney injury) resolved . Parkinson disease . Malignant neoplasm of bronchus and lung, unspecified site . Dehydration  Consults:  Neurology/stroke service, Dr Pearlean Brownie   Discharge Medications:   Medication List     As of 12/29/2011  1:26 PM    TAKE these medications         allopurinol 300 MG tablet   Commonly known as: ZYLOPRIM   Take 300 mg by mouth every morning.      amLODipine 10 MG tablet   Commonly known as: NORVASC   Take 1 tablet (10 mg total) by mouth daily.      aspirin EC 81 MG tablet   Take 81 mg by mouth every morning.      clopidogrel 75 MG tablet   Commonly known as: PLAVIX   Take 75 mg by mouth every morning.      divalproex 250 MG DR tablet   Commonly known as: DEPAKOTE   Take 250 mg by mouth 2 (two) times daily.      escitalopram 10 MG tablet   Commonly known as: LEXAPRO   Take 10 mg by mouth every morning.      hydrALAZINE 50 MG tablet   Commonly known as: APRESOLINE   Take 1 tablet (50 mg total) by mouth 2 (two) times daily.      meclizine 50 MG tablet   Commonly known as: ANTIVERT   Take 25 mg by mouth 2 (two) times daily.      simvastatin 40 MG tablet   Commonly known as: ZOCOR   Take 40 mg by mouth at bedtime.      TAB-A-VITE PO   Take 1 tablet by mouth every morning.         Brief H and P: For complete details please refer to admission H and P, but in brief Patient is 78 year old male with history of previous stroke, Parkinson's disease, hypertension, history of lung cancer, CAD, gout resident of assisted living facility resident was eating lunch  On the day of admission when he noticed left arm weakness.  Patient was seen last normal around 9 AM at the facility. Patient denied any other focal weakness, he did not have any significant slurred speech at time of admission. Patient was admitted last month under internal medicine teaching service when he had slurred speech and confusion and was found to have subacute small infarct in the posterior right corona radiata. Patient is on aspirin and Plavix outpatient.  Patient was evaluated by neurology service in the ED, he was not considered as TPA candidate due to delayed presentation and recent CVA in 11/2011  Hospital Course:  Patient was admitted for acute CVA workup secondary to left arm weakness. Patient was found to have positive infarct on the MRI. MRI showed 2 cm acute infarct of the right motor strip and surrounding white matter. MRA of the head without contrast was stable Carotid Dopplers were done last month on 11/19/2011 which hadn't shown no significant extracranial carotid artery stenosis. 2-D echo showed EF of 55% with normal wall motion, LA mild dilation. Patient was placed on aspirin and Plavix. Physical therapy recommended skilled nursing facility for continued rehabilitation. Patient passed a  swallow study and has been tolerating heart healthy diet. He will followup with Dr. Pearlean Brownie in 2 months  Accelerated hypertension: BP was somewhat uncontrolled and patient was noted to be not on any antihypertensives outpatient. He was placed on Norvasc and hydralazine.  AKI (acute kidney injury), dehydration with ketonuria: Creatinine was 1.90 at the time of admission and improved to 1.16 with gentle hydration.     Day of Discharge BP 195/86  Pulse 66  Temp 97.4 F (36.3 C) (Oral)  Resp 18  Ht 5\' 8"  (1.727 m)  Wt 65.772 kg (145 lb)  BMI 22.05 kg/m2  SpO2 93%  Physical Exam:  General: Alert and awake, oriented x3, NAD  CVS: S1-S2 clear, no murmur rubs or gallops  Chest: CTAB  Abdomen: soft NT, ND, NBS  Extremities: no c/c./e bilaterally    Neuro: Left upper arm weakness 2-3/5   The results of significant diagnostics from this hospitalization (including imaging, microbiology, ancillary and laboratory) are listed below for reference.    LAB RESULTS: Basic Metabolic Panel:  Lab 12/29/11 1191 12/27/11 1816 12/27/11 1454 12/27/11 1438  NA 138 -- 141 --  K 4.1 -- 4.4 --  CL 102 -- 104 --  CO2 28 -- -- 28  GLUCOSE 98 -- 92 --  BUN 21 -- 29* --  CREATININE 1.16 1.44* -- --  CALCIUM 9.1 -- -- 9.4  MG -- -- -- --  PHOS -- -- -- --   Liver Function Tests:  Lab 12/27/11 1438  AST 18  ALT 7  ALKPHOS 50  BILITOT 0.4  PROT 6.9  ALBUMIN 3.0*  CBC:  Lab 12/27/11 1816 12/27/11 1454 12/27/11 1438  WBC 3.2* -- 3.3*  NEUTROABS -- -- 1.7  HGB 12.8* 13.9 --  HCT 41.6 41.0 --  MCV 89.3 -- --  PLT 143* -- 143*   Cardiac Enzymes:  Lab 12/27/11 1438  CKTOTAL --  CKMB --  CKMBINDEX --  TROPONINI <0.30   BNP: No components found with this basename: POCBNP:2 CBG:  Lab 12/27/11 2311 12/27/11 1353  GLUCAP 85 82    Significant Diagnostic Studies:  Dg Chest 2 View  12/28/2011  *RADIOLOGY REPORT*  Clinical Data: Stroke.  Cough.  Lung cancer.  CHEST - 2 VIEW  Comparison: 11/18/2011 chest x-ray.  09/19/2011 chest CT.  Findings: Post-therapy changes right midlung appears similar to the recent chest x-ray.  Postsurgical changes left hilar region stable.  Nodularity posterior aspect of the left sixth rib unchanged probably representing confluence of shadows rather than underlying pulmonary nodule.  Attention to this on follow-up.  Persistent elevation left hemidiaphragm and mediastinal shift to the left.  Mild central pulmonary vascular prominence without pulmonary edema. No segmental consolidation.  IMPRESSION: Relatively stable post therapy exam as detailed above.   Original Report Authenticated By: Lacy Duverney, M.D.    Mr Brain Wo Contrast  12/28/2011  *RADIOLOGY REPORT*  Clinical Data:  78 year old male with prior stroke.   Left upper extremity weakness.  Comparison: Brain MRI and MRA 11/18/2011 and earlier.  MRI HEAD WITHOUT CONTRAST  Technique: Multiplanar, multiecho pulse sequences of the brain and surrounding structures were obtained according to standard protocol without intravenous contrast.  Findings: Approximately 2 cm confluent cortically based infarct at the right superior frontal gyrus, motor strip.  Subcortical white matter extension.  Occasional other scattered small foci of ischemia in the nearby right parietal lobe .  Mild associated cytotoxic edema.  No mass effect or hemorrhage.  Faint residual diffusion abnormality at  the site of the smaller November white matter infarct.  Diffusion elsewhere is within normal limits.  Major intracranial vascular flow voids are stable.  Stable gray and white matter signal elsewhere.  Remote superior left MCA infarct with encephalomalacia, a similar sized the today's lesion.  Chronic small vessel disease in the pons.  Stable visualized cervical spine, negative except for evidence of previous ACDF beginning at C3.  Normal bone marrow signal. Stable ventricle size and configuration.  Negative pituitary and cervicomedullary junction.  Stable orbit soft tissues.  Decreased mild paranasal sinus mucosal thickening.  Mastoids are clear.  Negative scalp soft tissues.  IMPRESSION: 1.  2 cm acute infarct of the right motor strip and surrounding white matter.  No mass effect or hemorrhage. 2.  Interval expected evolution of the small nearby right corona radiata infarct from November.  Underlying chronic ischemia.   MRA HEAD WITHOUT CONTRAST  Technique: Angiographic images of the Circle of Willis were obtained using MRA technique without  intravenous contrast.  Findings: Stable antegrade flow in the posterior circulation supplied by the distal right vertebral artery.  Preserved bilateral PICA flow.  No significant flow in the distal left vertebral artery, unchanged and on the previous study  appeared related to functional termination of that vessel in the left PICA.  Stable basilar artery without stenosis.  SCA and PCA origins are patent and stable.  Fetal type left PCA origin.  Visualized bilateral PCA branches are stable.  Motion artifact affecting the ICA siphons.  Stable antegrade flow in the anterior circulation.  No definite ICA stenosis.  Carotid termini remain patent.  MCA and ACA origins are stable.  Anterior communicating artery is diminutive.  Visualized bilateral ACA branches are stable and within normal limits.  The visualized bilateral MCA branches are stable with mild irregularity compatible with atherosclerosis.  IMPRESSION:  Stable intracranial MRA since November. No right MCA focal stenosis or major branch occlusion.   Original Report Authenticated By: Erskine Speed, M.D.       Disposition and Follow-up:     Discharge Orders    Future Appointments: Provider: Department: Dept Phone: Center:   03/20/2012 12:00 PM [facility] [phone] [place]   03/21/2012 1:00 PM Lurline Hare, MD [facility] [phone] None     Future Orders Please Complete By Expires   Diet - low sodium heart healthy      Increase activity slowly          DISPOSITION: Continue rehabilitation/ SNF  DIET: Heart healthy diet ACTIVITY: cont PT as tolerated   DISCHARGE FOLLOW-UP Follow-up Information    Follow up with Pola Corn, MD. Schedule an appointment as soon as possible for a visit in 10 days. (for hospital follow-up)    Contact information:   [address] [place] [address] [phone]       Follow up with Gates Rigg, MD. Schedule an appointment as soon as possible for a visit in 2 months. (for stroke follow-up)    Contact information:   [address] [facility] [place] [address] [phone]          Time spent on Discharge: 38  mins  Signed:   RAI,RIPUDEEP M.D. [facility] 12/29/2011, 1:26 PM Pager: [phone]

## 2011-12-29 NOTE — Progress Notes (Signed)
Patient was transferring to commode earlier at which time he was seen to have left eye deviation followed by 2 minutes of shaking activity. He was confused and post-ictal but has since returned to baseline.   On exam, he moves all extremities to command, and is able to tell me his name. Denies any current pain.   I feel that this did represent a seizure and will start him on Keppra. He will need to continue this, so I do not think that an EEG would significantly alter management unless he were to have another without return to baseline.   1) CT head 2) keppra 1gm load IV, then 500mg  PO bid.   Ritta Slot, MD [facility] [phone]  If 7pm- 7am, please page neurology on call at [phone].

## 2011-12-30 DIAGNOSIS — I639 Cerebral infarction, unspecified: Secondary | ICD-10-CM

## 2011-12-30 DIAGNOSIS — F0390 Unspecified dementia without behavioral disturbance: Secondary | ICD-10-CM

## 2011-12-30 DIAGNOSIS — R569 Unspecified convulsions: Secondary | ICD-10-CM

## 2011-12-30 DIAGNOSIS — R4182 Altered mental status, unspecified: Secondary | ICD-10-CM

## 2011-12-30 LAB — VALPROIC ACID LEVEL: Valproic Acid Lvl: 41.5 ug/mL — ABNORMAL LOW (ref 50.0–100.0)

## 2011-12-30 MED ORDER — ATORVASTATIN CALCIUM 20 MG PO TABS
20.0000 mg | ORAL_TABLET | Freq: Every day | ORAL | Status: DC
  Administered 2011-12-30 – 2011-12-31 (×2): 20 mg via ORAL
  Filled 2011-12-30 (×3): qty 1

## 2011-12-30 MED ORDER — DIVALPROEX SODIUM 500 MG PO DR TAB
500.0000 mg | DELAYED_RELEASE_TABLET | Freq: Two times a day (BID) | ORAL | Status: DC
  Administered 2011-12-30 – 2012-01-01 (×4): 500 mg via ORAL
  Filled 2011-12-30 (×5): qty 1

## 2011-12-30 MED ORDER — DIVALPROEX SODIUM 250 MG PO DR TAB
250.0000 mg | DELAYED_RELEASE_TABLET | Freq: Once | ORAL | Status: AC
  Administered 2011-12-30: 250 mg via ORAL
  Filled 2011-12-30: qty 1

## 2011-12-30 MED ORDER — ENOXAPARIN SODIUM 40 MG/0.4ML ~~LOC~~ SOLN
40.0000 mg | SUBCUTANEOUS | Status: DC
  Administered 2011-12-30 – 2011-12-31 (×2): 40 mg via SUBCUTANEOUS
  Filled 2011-12-30 (×3): qty 0.4

## 2011-12-30 NOTE — Progress Notes (Signed)
Nutrition Brief Note  Patient identified on the Malnutrition Screening Tool (MST) Report for recent weight lost without trying (patient unsure).  Patient has had some weight loss, however, not significant for time frame.  Wt Readings from Last 10 Encounters:  12/27/11 145 lb (65.772 kg)  11/18/11 155 lb (70.308 kg)  01/30/11 160 lb (72.576 kg)  12/29/10 164 lb 6.4 oz (74.571 kg)    Body mass index is 22.05 kg/(m^2). Pt meets criteria for Normal based on current BMI.   Current diet order is Heart Healthy, patient is consuming approximately 100% of meals at this time. Labs and medications reviewed.   No nutrition interventions warranted at this time. If nutrition issues arise, please consult RD.   Kirkland Hun, RD, LDN Pager #: [phone] After-Hours Pager #: [phone]

## 2011-12-30 NOTE — Progress Notes (Signed)
Subjective: No further seizure activtiy  Exam: Filed Vitals:   12/30/11 0908  BP: 119/50  Pulse: 63  Temp: 97.2 F (36.2 C)  Resp: 18   Gen: In bed, NAD MS: Awake, alert, not oriented(baseline) ZO:XWRUEAV examiner, face symmetric Motor: 3/5 proximal LUE, 1/5 distally, 5/5 otherwise.  Sensory:intact to LT.   Impression: 78 yo M with small cortical infarct seen to have seizure last night. Given that he was on depakote prior and his level is low(41 one hour after dose), it would make more sense to increase this than continue keppra.   Recommendations: 1)Discontinue Keppra 2) Increase Depakote to 500mg  BID  Ritta Slot, MD [facility] [phone]  If 7pm- 7am, please page neurology on call at [phone].

## 2011-12-30 NOTE — Progress Notes (Signed)
CT scan result relayed to Dr. Marguerita Merles (MD on call). No new order given.

## 2011-12-30 NOTE — Progress Notes (Signed)
[facility] PROGRESS NOTE  Christopher Cook ZOX:[id number] DOB: 11/12/1921 DOA: 12/27/2011 PCP: Pola Corn, MD  Assessment/Plan: 1. Acute stroke--thought to be secondary to small vessel disease. Aspirin 325 mg daily and continue Plavix per neurology. Carotid Dopplers were done in 11 2013 showed no significant extra cranial carotid artery stenosis. New stroke noted 12/27 in context of seizure. No new recommendations from neurology. 2. Seizure--Depakote adjusted per neurology. Monitor. 3. Hypertension--well-controlled. Continue Norvasc, hydralazine 4. Acute renal failure--resolved 5. Parkinson's disease 6. Dementia  Code Status:  Full code Family Communication:  None present Disposition Plan:  Skilled nursing facility 12/30  Brendia Sacks, MD  [facility] Team 4  Pager [phone] If 7PM-7AM, please contact night-coverage at [email or web address], password [facility] 12/30/2011, 3:29 PM  LOS: 3 days   Brief narrative: 78 y.o. male who was LKW at 0900 was eating lunch later with son who noticed left arm paralysis. EMS called. Patient has dementia and Parkinsons disease. Has been living in [facility] for past couple of years. Last admitted to [facility] in November 2013. At that time he was admitted slurred speech, weakness, confusion (has dementia) with subacute small infarct posterior right corona radiata on scan. On plavix and baby aspirin as outpatient. Patient has been on depakote but this is for agitation. less than 6 weeks from previous stroke; therefore not tPA candidate  Consultants:  Neurology  Physical/occupational therapy: SNF  Speech therapy: Skilled nursing facility. Now needs.  Procedures:  12/26 echocardiogram--Left ventricle: The cavity size was normal. Wall thickness was increased in a pattern of severe LVH. Systolic function was normal. The estimated ejection fraction was in the range of 55% to 60%. Wall motion was normal; there were no regional wall motion  abnormalities.  HPI/Subjective: Doing okay.  Objective: Filed Vitals:   12/30/11 0154 12/30/11 0608 12/30/11 0908 12/30/11 1343  BP: 106/51 140/61 119/50 107/52  Pulse: 65 64 63 67  Temp: 97.6 F (36.4 C) 98 F (36.7 C) 97.2 F (36.2 C) 97.4 F (36.3 C)  TempSrc: Oral Oral Oral Oral  Resp: 20 20 18 18   Height:      Weight:      SpO2: 99% 100% 96% 99%    Intake/Output Summary (Last 24 hours) at 12/30/11 1529 Last data filed at 12/30/11 0612  Gross per 24 hour  Intake    120 ml  Output    150 ml  Net    -30 ml   Filed Weights   12/27/11 1313  Weight: 65.772 kg (145 lb)    Exam:  General:  Appears calm and comfortable, speech slow and quiet Cardiovascular: RRR, 2/6 systolic murmur, no r/g. No LE edema. Telemetry: SR, no arrhythmias  Respiratory: CTA bilaterally, no w/r/r. Normal respiratory effort. Psychiatric: Difficult to assess mood and affect.  Data Reviewed: Basic Metabolic Panel:  Lab 12/29/11 8295 12/27/11 1816 12/27/11 1454 12/27/11 1438  NA 138 -- 141 137  K 4.1 -- 4.4 4.2  CL 102 -- 104 101  CO2 28 -- -- 28  GLUCOSE 98 -- 92 95  BUN 21 -- 29* 27*  CREATININE 1.16 1.44* 1.90* 1.73*  CALCIUM 9.1 -- -- 9.4  MG -- -- -- --  PHOS -- -- -- --   Liver Function Tests:  Lab 12/27/11 1438  AST 18  ALT 7  ALKPHOS 50  BILITOT 0.4  PROT 6.9  ALBUMIN 3.0*   CBC:  Lab 12/27/11 1816 12/27/11 1454 12/27/11 1438  WBC 3.2* -- 3.3*  NEUTROABS -- -- 1.7  HGB 12.8* 13.9 12.7*  HCT 41.6 41.0 39.4  MCV 89.3 -- 89.1  PLT 143* -- 143*   Cardiac Enzymes:  Lab 12/27/11 1438  CKTOTAL --  CKMB --  CKMBINDEX --  TROPONINI <0.30   CBG:  Lab 12/27/11 2311 12/27/11 1353  GLUCAP 85 82    Recent Results (from the past 240 hour(s))  MRSA PCR SCREENING     Status: Abnormal   Collection Time   12/27/11  8:42 PM      Component Value Range Status Comment   MRSA by PCR POSITIVE (*) NEGATIVE Final      Studies: Ct Head Wo Contrast  12/29/2011   *RADIOLOGY REPORT*  Clinical Data: Seizure activity  CT HEAD WITHOUT CONTRAST  Technique:  Contiguous axial images were obtained from the base of the skull through the vertex without contrast.  Comparison: Brain MRI 12/28/2011  Findings: There is a cortical infarction in the high right frontal lobe along the motor strip (image 25) which is demonstrated on comparison MRI.   No associated hemorrhage.  Remote infarction in the more anterior left frontal lobe.  There is extensive periventricular white matter hypodensities.  There is ventriculomegaly similar to prior.  Extensive white matter disease in the basal ganglia and deep white matter tracts.  No intracranial hemorrhage.  No midline shift or mass effect.  IMPRESSION:  1.  Small right to  frontal cortical infarction is now evident.  2.   No evidence of mass effect or hemorrhage.  3.  Extensive microvascular disease and ventriculomegaly is unchanged.   Original Report Authenticated By: Genevive Bi, M.D.     Scheduled Meds:   . allopurinol  300 mg Oral q morning - 10a  . amLODipine  10 mg Oral Daily  . aspirin  300 mg Rectal Daily   Or  . aspirin  325 mg Oral Daily  . atorvastatin  20 mg Oral q1800  . Chlorhexidine Gluconate Cloth  6 each Topical Q0600  . clopidogrel  75 mg Oral q morning - 10a  . divalproex  500 mg Oral BID  . enoxaparin (LOVENOX) injection  40 mg Subcutaneous Q24H  . escitalopram  10 mg Oral q morning - 10a  . hydrALAZINE  50 mg Oral BID  . meclizine  25 mg Oral BID  . mupirocin ointment  1 application Nasal BID   Continuous Infusions:   Principal Problem:  *Acute ischemic stroke Active Problems:  Parkinson disease  Left arm weakness  Accelerated hypertension  Dehydration  Seizure  Dementia     Brendia Sacks, MD  [facility] Team 4  Pager [phone] If 7PM-7AM, please contact night-coverage at [email or web address], password [facility] 12/30/2011, 3:29 PM  LOS: 3 days   Time spent: 25 minutes

## 2011-12-31 DIAGNOSIS — F0390 Unspecified dementia without behavioral disturbance: Secondary | ICD-10-CM

## 2011-12-31 DIAGNOSIS — R569 Unspecified convulsions: Secondary | ICD-10-CM

## 2011-12-31 NOTE — Progress Notes (Signed)
Subjective: No further seizure activity.   Exam: Filed Vitals:   12/31/11 0548  BP: 135/65  Pulse: 67  Temp: 97.1 F (36.2 C)  Resp: 20   Gen: In bed, NAD MS: Awake, alert, oriented to person and place, but not year ZO:XWRU, face symmetric Motor: 3/5 proximally in left arm, 1/5 distally. 5/5 otherwise   Impression: 78 yo M with cortical infarct and subsequent seizures. Now on increased dose of depakote.   Recommendations: 1) Could check depakote level sometime next week  2) Continue depakote 500mg  BID 3) Flu with neurology as outpatient. No further recommendations at this time. Neurology will sign off.   Ritta Slot, MD [facility] [phone]  If 7pm- 7am, please page neurology on call at [phone].

## 2011-12-31 NOTE — Progress Notes (Signed)
[facility] PROGRESS NOTE  Christopher Cook WUJ:[id number] DOB: 05-Nov-1921 DOA: 12/27/2011 PCP: Pola Corn, MD  Assessment/Plan: 1. Acute stroke--thought to be secondary to small vessel disease. Aspirin 325 mg daily and continue Plavix per neurology. Carotid Dopplers were done in 11 2013 showed no significant extra cranial carotid artery stenosis. New stroke noted 12/27 in context of seizure. No new recommendations from neurology. 2. Seizure--stable. Depakote adjusted per neurology. Monitor. Followup Depakote level next week. 3. Hypertension--well-controlled. Continue Norvasc, hydralazine 4. Acute renal failure--resolved 5. Parkinson's disease 6. Dementia  Code Status:  Full code Family Communication:  None present Disposition Plan:  Skilled nursing facility 12/30  Brendia Sacks, MD  [facility] Team 4  Pager [phone] If 7PM-7AM, please contact night-coverage at [email or web address], password [facility] 12/31/2011, 1:14 PM  LOS: 4 days   Brief narrative: 78 y.o. male who was LKW at 0900 was eating lunch later with son who noticed left arm paralysis. EMS called. Patient has dementia and Parkinsons disease. Has been living in [facility] for past couple of years. Last admitted to [facility] in November 2013. At that time he was admitted slurred speech, weakness, confusion (has dementia) with subacute small infarct posterior right corona radiata on scan. On plavix and baby aspirin as outpatient. Patient has been on depakote but this is for agitation. less than 6 weeks from previous stroke; therefore not tPA candidate  Consultants:  Neurology  Physical/occupational therapy: SNF  Speech therapy: Skilled nursing facility. Now needs.  Procedures:  12/26 echocardiogram--Left ventricle: The cavity size was normal. Wall thickness was increased in a pattern of severe LVH. Systolic function was normal. The estimated ejection fraction was in the range of 55% to 60%. Wall motion was normal;  there were no regional wall motion abnormalities.  HPI/Subjective: Feels fine. Eating lunch. No complaints.  Objective: Filed Vitals:   12/31/11 0224 12/31/11 0548 12/31/11 0931 12/31/11 1054  BP: 120/53 135/65 99/46 117/54  Pulse: 59 67 71   Temp: 97.1 F (36.2 C) 97.1 F (36.2 C) 97.1 F (36.2 C)   TempSrc: Oral Oral Oral   Resp: 20 20 19    Height:      Weight:      SpO2: 100% 99% 100%     Intake/Output Summary (Last 24 hours) at 12/31/11 1314 Last data filed at 12/30/11 1702  Gross per 24 hour  Intake      0 ml  Output    400 ml  Net   -400 ml   Filed Weights   12/27/11 1313  Weight: 65.772 kg (145 lb)    Exam:  General:  Appears calm and comfortable, speech clear. Cardiovascular: RRR, 2/6 systolic murmur, no r/g.  Telemetry: SR, no arrhythmias  Respiratory: CTA bilaterally, no w/r/r. Normal respiratory effort. Psychiatric:  Grossly normal mood and affect. Speech fluent and appropriate.  Data Reviewed: Basic Metabolic Panel:  Lab 12/29/11 8657 12/27/11 1816 12/27/11 1454 12/27/11 1438  NA 138 -- 141 137  K 4.1 -- 4.4 4.2  CL 102 -- 104 101  CO2 28 -- -- 28  GLUCOSE 98 -- 92 95  BUN 21 -- 29* 27*  CREATININE 1.16 1.44* 1.90* 1.73*  CALCIUM 9.1 -- -- 9.4  MG -- -- -- --  PHOS -- -- -- --   Liver Function Tests:  Lab 12/27/11 1438  AST 18  ALT 7  ALKPHOS 50  BILITOT 0.4  PROT 6.9  ALBUMIN 3.0*   CBC:  Lab 12/27/11 1816 12/27/11 1454 12/27/11 1438  WBC 3.2* --  3.3*  NEUTROABS -- -- 1.7  HGB 12.8* 13.9 12.7*  HCT 41.6 41.0 39.4  MCV 89.3 -- 89.1  PLT 143* -- 143*   Cardiac Enzymes:  Lab 12/27/11 1438  CKTOTAL --  CKMB --  CKMBINDEX --  TROPONINI <0.30   CBG:  Lab 12/27/11 2311 12/27/11 1353  GLUCAP 85 82    Recent Results (from the past 240 hour(s))  MRSA PCR SCREENING     Status: Abnormal   Collection Time   12/27/11  8:42 PM      Component Value Range Status Comment   MRSA by PCR POSITIVE (*) NEGATIVE Final       Studies: Ct Head Wo Contrast  12/29/2011  *RADIOLOGY REPORT*  Clinical Data: Seizure activity  CT HEAD WITHOUT CONTRAST  Technique:  Contiguous axial images were obtained from the base of the skull through the vertex without contrast.  Comparison: Brain MRI 12/28/2011  Findings: There is a cortical infarction in the high right frontal lobe along the motor strip (image 25) which is demonstrated on comparison MRI.   No associated hemorrhage.  Remote infarction in the more anterior left frontal lobe.  There is extensive periventricular white matter hypodensities.  There is ventriculomegaly similar to prior.  Extensive white matter disease in the basal ganglia and deep white matter tracts.  No intracranial hemorrhage.  No midline shift or mass effect.  IMPRESSION:  1.  Small right to  frontal cortical infarction is now evident.  2.   No evidence of mass effect or hemorrhage.  3.  Extensive microvascular disease and ventriculomegaly is unchanged.   Original Report Authenticated By: Genevive Bi, M.D.     Scheduled Meds:    . allopurinol  300 mg Oral q morning - 10a  . amLODipine  10 mg Oral Daily  . aspirin  300 mg Rectal Daily   Or  . aspirin  325 mg Oral Daily  . atorvastatin  20 mg Oral q1800  . Chlorhexidine Gluconate Cloth  6 each Topical Q0600  . clopidogrel  75 mg Oral q morning - 10a  . divalproex  500 mg Oral BID  . enoxaparin (LOVENOX) injection  40 mg Subcutaneous Q24H  . escitalopram  10 mg Oral q morning - 10a  . hydrALAZINE  50 mg Oral BID  . meclizine  25 mg Oral BID  . mupirocin ointment  1 application Nasal BID   Continuous Infusions:   Principal Problem:  *Acute ischemic stroke Active Problems:  Parkinson disease  Left arm weakness  Accelerated hypertension  Dehydration  Seizure  Dementia     Brendia Sacks, MD  [facility] Team 4  Pager [phone] If 7PM-7AM, please contact night-coverage at [email or web address], password [facility] 12/31/2011, 1:14 PM  LOS: 4  days   Time spent: 15 minutes

## 2012-01-01 MED ORDER — HYDRALAZINE HCL 50 MG PO TABS: 50.0000 mg | ORAL_TABLET | Freq: Two times a day (BID) | ORAL | Status: DC

## 2012-01-01 MED ORDER — DIVALPROEX SODIUM 250 MG PO DR TAB: 500.0000 mg | DELAYED_RELEASE_TABLET | Freq: Two times a day (BID) | ORAL | Status: DC

## 2012-01-01 MED ORDER — AMLODIPINE BESYLATE 10 MG PO TABS: 10.0000 mg | ORAL_TABLET | Freq: Every day | ORAL | Status: DC

## 2012-01-01 MED ORDER — ASPIRIN 325 MG PO TABS: 325.0000 mg | ORAL_TABLET | Freq: Every day | ORAL | Status: DC

## 2012-01-01 NOTE — Clinical Social Work Note (Signed)
Clinical Social Work   Pt is ready for discharge to [facility]. Facility has received discharge summary and is ready to accept pt. Pt and family is agreeable to discharge plan. [facility] will provide transportation to facility. CSW is signing off as no further needs identified.   Dede Query, MSW, Theresia Majors [phone]

## 2012-01-01 NOTE — Progress Notes (Signed)
UR COMPLETED  

## 2012-01-01 NOTE — Clinical Social Work Placement (Signed)
     Clinical Social Work Department CLINICAL SOCIAL WORK PLACEMENT NOTE 01/01/2012  Patient:  Christopher Cook, Christopher Cook  Account Number:  [id number] Admit date:  12/27/2011  Clinical Social Worker:  Peggyann Shoals  Date/time:  01/01/2012 02:13 PM  Clinical Social Work is seeking post-discharge placement for this patient at the following level of care:   SKILLED NURSING   (*CSW will update this form in Epic as items are completed)   12/28/2011  Patient/family provided with [facility] Department of Clinical Social Works list of facilities offering this level of care within the geographic area requested by the patient (or if unable, by the patients family).  12/28/2011  Patient/family informed of their freedom to choose among providers that offer the needed level of care, that participate in Medicare, Medicaid or managed care program needed by the patient, have an available bed and are willing to accept the patient.  12/28/2011  Patient/family informed of [facility] ownership interest in [facility], as well as of the fact that they are under no obligation to receive care at this facility.  PASARR submitted to EDS on 12/28/2011 PASARR number received from [employer] on 12/28/2011  FL2 transmitted to all facilities in geographic area requested by pt/family on  12/28/2011 FL2 transmitted to all facilities within larger geographic area on   Patient informed that his/her managed care company has contracts with or will negotiate with  certain facilities, including the following:     Patient/family informed of bed offers received:  12/29/2011 Patient chooses bed at [facility] Physician recommends and patient chooses bed at  [facility]  Patient to be transferred to [facility] on  01/01/2012 Patient to be transferred to facility by [facility]  The following physician request were entered in Epic:   Additional  Comments:

## 2012-01-01 NOTE — Progress Notes (Signed)
Physical Therapy Treatment Patient Details Name: Christopher Cook MRN: [id number] DOB: Aug 30, 1921 Today's Date: 01/01/2012 Time: 4098-1191 PT Time Calculation (min): 28 min  PT Assessment / Plan / Recommendation Comments on Treatment Session  Pt was pleasent and agreeable to PT.  Worked with patient on trunk support and seated balance activities.  Patient tolerated this well.  then began to perform stnading balance activities. Patient required +2 total assist for standing and was incontinent of stool.  While cleansing patient, pt had another liquid stool BM.  Patient was pivoted to commode.  Patient able to sit unsupported while on commode.  Patient then perform another trial of standing for hygiene and standing balance activities.  Patient demonstrates difficulties carry out tasks such as weight shifting and advancement.  Feel patient still demonstrates significant deficits in functional mobility and will benefit from continued skilled PT.    Follow Up Recommendations  SNF;Supervision/Assistance - 24 hour     Does the patient have the potential to tolerate intense rehabilitation     Barriers to Discharge        Equipment Recommendations  None recommended by PT    Recommendations for Other Services    Frequency Min 3X/week   Plan Discharge plan remains appropriate    Precautions / Restrictions Precautions Precautions: Fall Restrictions Weight Bearing Restrictions: No       Mobility  Bed Mobility Bed Mobility: Supine to Sit;Sitting - Scoot to Edge of Bed Supine to Sit: 3: Mod assist;HOB elevated;With rails Sitting - Scoot to Edge of Bed: 2: Max assist Transfers Transfers: Sit to Stand;Stand to [employer] Transfers Sit to Stand: 1: +2 Total assist;With upper extremity assist;From bed Sit to Stand: Patient Percentage: 50% Stand to Sit: 1: +2 Total assist;To chair/3-in-1;With upper extremity assist Stand to Sit: Patient Percentage: 50% Stand Pivot Transfers: 1: +2 Total  assist Stand Pivot Transfers: Patient Percentage: 60% Details for Transfer Assistance: pt with posterior lean with static standing Ambulation/Gait Ambulation/Gait Assistance: Not tested (comment)     PT Goals Acute Rehab PT Goals PT Goal Formulation: With patient Time For Goal Achievement: 01/02/12 Potential to Achieve Goals: Fair Pt will go Supine/Side to Sit: with modified independence PT Goal: Supine/Side to Sit - Progress: Not progressing Pt will go Sit to Stand: with modified independence PT Goal: Sit to Stand - Progress: Not progressing Pt will go Stand to Sit: with modified independence PT Goal: Stand to Sit - Progress: Not progressing Pt will Ambulate: >150 feet;with modified independence  Visit Information  Last PT Received On: 01/01/12 Assistance Needed: +2    Subjective Data  Subjective: I'm feeling alright Patient Stated Goal: to move   Cognition  Overall Cognitive Status: Impaired Area of Impairment: Memory;Following commands;Awareness of deficits Arousal/Alertness: Awake/alert Orientation Level: Disoriented to;Time;Situation;Place Behavior During Session: [facility] for tasks performed Memory: Decreased recall of precautions Following Commands: Follows one step commands consistently    Balance  Balance Balance Assessed: Yes Dynamic Sitting Balance Dynamic Sitting - Balance Support: Feet supported;No upper extremity supported Dynamic Sitting - Level of Assistance: 5: Stand by assistance Dynamic Sitting - Balance Activities: Lateral lean/weight shifting;Forward lean/weight shifting Static Standing Balance Static Standing - Balance Support: Bilateral upper extremity supported;During functional activity Static Standing - Level of Assistance: 1: +2 Total assist Static Standing - Comment/# of Minutes: 7 minutes and 4 minutes  End of Session PT - End of Session Equipment Utilized During Treatment: Gait belt Activity Tolerance: Patient tolerated treatment well;Patient  limited by fatigue Patient left: in chair;with call  bell/phone within reach;with chair alarm set;with family/visitor present Nurse Communication: Mobility status   GP     Christopher Cook 01/01/2012, 4:09 PM Christopher Cook, PT DPT  [phone]

## 2012-01-01 NOTE — Progress Notes (Signed)
[facility] PROGRESS NOTE  Christopher Cook ZOX:[id number] DOB: August 04, 1921 DOA: 12/27/2011 PCP: Pola Corn, MD  Assessment/Plan: 1. Acute stroke--thought to be secondary to small vessel disease. Aspirin 325 mg daily and continue Plavix per neurology. Carotid Dopplers were done in 11 2013 showed no significant extra cranial carotid artery stenosis. New stroke noted 12/27 in context of seizure. No new recommendations from neurology. 2. Seizure--stable. Depakote adjusted per neurology. Monitor. Followup Depakote level next week. 3. Hypertension--well-controlled. Continue Norvasc, hydralazine 4. Acute renal failure--resolved 5. Parkinson's disease 6. Dementia  Code Status:  Full code Family Communication:  Discussed with son by telephone, concurs with this or Disposition Plan:  Skilled nursing facility today  Brendia Sacks, MD  [facility] Team 4  Pager [phone] If 7PM-7AM, please contact night-coverage at [email or web address], password [facility] 01/01/2012, 1:36 PM  LOS: 5 days   Brief narrative: 78 y.o. male who was LKW at 0900 was eating lunch later with son who noticed left arm paralysis. EMS called. Patient has dementia and Parkinsons disease. Has been living in [facility] for past couple of years. Last admitted to [facility] in November 2013. At that time he was admitted slurred speech, weakness, confusion (has dementia) with subacute small infarct posterior right corona radiata on scan. On plavix and baby aspirin as outpatient. Patient has been on depakote but this is for agitation. less than 6 weeks from previous stroke; therefore not tPA candidate  Patient was admitted for acute CVA workup secondary to left arm weakness. Patient was found to have positive infarct on the MRI. MRI showed 2 cm acute infarct of the right motor strip and surrounding white matter. MRA of the head without contrast was stable  Carotid Dopplers were done last month on 11/19/2011 which hadn't shown no  significant extracranial carotid artery stenosis. 2-D echo showed EF of 55% with normal wall motion, LA mild dilation. Patient was placed on aspirin and Plavix. Physical therapy recommended skilled nursing facility for continued rehabilitation. Patient passed a swallow study and has been tolerating heart healthy diet. He will followup with Dr. Pearlean Brownie in 2 months   Consultants:  Neurology  Physical/occupational therapy: SNF  Speech therapy: Skilled nursing facility. Now needs.  Procedures:  12/26 echocardiogram--Left ventricle: The cavity size was normal. Wall thickness was increased in a pattern of severe LVH. Systolic function was normal. The estimated ejection fraction was in the range of 55% to 60%. Wall motion was normal; there were no regional wall motion abnormalities.  HPI/Subjective: No complaints. Feels well today. Eating fine.  Objective: Filed Vitals:   12/31/11 2238 01/01/12 0238 01/01/12 0547 01/01/12 1105  BP: 125/52 140/66 153/61 167/67  Pulse: 69 60 62 62  Temp: 97.8 F (36.6 C) 97.3 F (36.3 C) 97.3 F (36.3 C) 97.5 F (36.4 C)  TempSrc: Oral Oral Oral Oral  Resp: 16 17 17 18   Height:      Weight:      SpO2: 99% 100% 100% 98%    Intake/Output Summary (Last 24 hours) at 01/01/12 1336 Last data filed at 01/01/12 0546  Gross per 24 hour  Intake      0 ml  Output   1400 ml  Net  -1400 ml   Filed Weights   12/27/11 1313  Weight: 65.772 kg (145 lb)    Exam:  General:  Appears calm and comfortable, speech clear. Eating lunch Cardiovascular: RRR, no murmur, no m/r/g.  Telemetry: SR/SB, no arrhythmias  Respiratory: CTA bilaterally, no w/r/r. Normal respiratory effort. Psychiatric:  Grossly  normal mood and affect. Speech fluent and appropriate.  Data Reviewed: Basic Metabolic Panel:  Lab 12/29/11 1610 12/27/11 1816 12/27/11 1454 12/27/11 1438  NA 138 -- 141 137  K 4.1 -- 4.4 4.2  CL 102 -- 104 101  CO2 28 -- -- 28  GLUCOSE 98 -- 92 95  BUN 21 --  29* 27*  CREATININE 1.16 1.44* 1.90* 1.73*  CALCIUM 9.1 -- -- 9.4  MG -- -- -- --  PHOS -- -- -- --   Liver Function Tests:  Lab 12/27/11 1438  AST 18  ALT 7  ALKPHOS 50  BILITOT 0.4  PROT 6.9  ALBUMIN 3.0*   CBC:  Lab 12/27/11 1816 12/27/11 1454 12/27/11 1438  WBC 3.2* -- 3.3*  NEUTROABS -- -- 1.7  HGB 12.8* 13.9 12.7*  HCT 41.6 41.0 39.4  MCV 89.3 -- 89.1  PLT 143* -- 143*   Cardiac Enzymes:  Lab 12/27/11 1438  CKTOTAL --  CKMB --  CKMBINDEX --  TROPONINI <0.30   CBG:  Lab 12/27/11 2311 12/27/11 1353  GLUCAP 85 82    Recent Results (from the past 240 hour(s))  MRSA PCR SCREENING     Status: Abnormal   Collection Time   12/27/11  8:42 PM      Component Value Range Status Comment   MRSA by PCR POSITIVE (*) NEGATIVE Final      Studies: No results found.  Scheduled Meds:    . allopurinol  300 mg Oral q morning - 10a  . amLODipine  10 mg Oral Daily  . aspirin  300 mg Rectal Daily   Or  . aspirin  325 mg Oral Daily  . atorvastatin  20 mg Oral q1800  . Chlorhexidine Gluconate Cloth  6 each Topical Q0600  . clopidogrel  75 mg Oral q morning - 10a  . divalproex  500 mg Oral BID  . enoxaparin (LOVENOX) injection  40 mg Subcutaneous Q24H  . escitalopram  10 mg Oral q morning - 10a  . hydrALAZINE  50 mg Oral BID  . meclizine  25 mg Oral BID  . mupirocin ointment  1 application Nasal BID   Continuous Infusions:   Principal Problem:  *Acute ischemic stroke Active Problems:  Parkinson disease  Left arm weakness  Accelerated hypertension  Dehydration  Seizure  Dementia     Brendia Sacks, MD  [facility] Team 4  Pager [phone] If 7PM-7AM, please contact night-coverage at [email or web address], password [facility] 01/01/2012, 1:36 PM  LOS: 5 days

## 2012-01-01 NOTE — Discharge Summary (Addendum)
Physician Discharge Summary  Makale Pindell WUJ:[id number] DOB: 19-Dec-1921 DOA: 12/27/2011  PCP: Pola Corn, MD  Admit date: 12/27/2011 Discharge date: 01/01/2012  Recommendations for Outpatient Follow-up:  1. Followup recent stroke, hypertension  2. Follow recent seizure, Depakote adjusted, please check Depakote level December 31- January 2 3. Skilled nursing facility for rehabilitation   Follow-up Information    Follow up with Pola Corn, MD. Schedule an appointment as soon as possible for a visit in 10 days. (for hospital follow-up)    Contact information:   [address] [place] [address] [phone]       Follow up with Gates Rigg, MD. Schedule an appointment as soon as possible for a visit in 2 months. (for stroke follow-up)    Contact information:   [address], [address] [facility] [place] [address] [phone]         Discharge Diagnoses:  1. Acute stroke 2. Seizure 3. Hypertension 4. Acute renal failure  5. Parkinson's disease  6. Dementia  Discharge Condition: Improved  Disposition: Skilled nursing facility   Diet recommendation:  Heart healthy diet, thin liquids.  Filed Weights   12/27/11 1313  Weight: 65.772 kg (145 lb)   History of present illness:  78 y.o. male was eating lunch later with son who noticed left arm paralysis. EMS called. Patient has dementia and Parkinsons disease. Has been living in [facility] for past couple of years. Last admitted to [facility] in November 2013. At that time he was admitted slurred speech, weakness, confusion (has dementia) with subacute small infarct posterior right corona radiata on scan. On plavix and baby aspirin as outpatient. Patient has been on depakote but this is for agitation. less than 6 weeks from previous stroke; therefore not tPA candidate  Hospital Course:  Mr. Maclellan was admitted for acute CVA workup secondary to left arm weakness. Patient was found  to have positive infarct on the MRI. MRI showed 2 cm acute infarct of the right motor strip and surrounding white matter. MRA of the head without contrast was stable . Carotid Dopplers were done last month on 11/19/2011 which hadn't shown no significant extracranial carotid artery stenosis. 2-D echo showed EF of 55% with normal wall motion, LA mild dilation. Patient  continued on  PlavixAn aspirin was increased to 325 mg daily . Physical therapy recommended skilled nursing facility for continued rehabilitation. Patient passed a swallow study and has been tolerating heart healthy diet. He will followup with Dr. Pearlean Brownie in 2 months. On the day of anticipated discharge 12/27 patient had a seizure. Neurology increased the patient's Depakote and he has been seizure free since.   1. Acute stroke--thought to be secondary to small vessel disease. Aspirin 325 mg daily and continue Plavix per neurology. Carotid Dopplers were done in 11 2013 showed no significant extra cranial carotid artery stenosis. No new recommendations from neurology.  2. Seizure--status post stroke. stable. Depakote adjusted per neurology. Monitor. Followup Depakote  in next few days  3. Hypertension-- stable.  Continue Norvasc, hydralazine  4. Acute renal failure--resolved  5. Parkinson's disease  6. Dementia  Consultants:  Neurology   Physical/occupational therapy: SNF   Speech therapy: Skilled nursing facility.   Procedures:  12/26 echocardiogram--Left ventricle: The cavity size was normal. Wall thickness was increased in a pattern of severe LVH. Systolic function was normal. The estimated ejection fraction was in the range of 55% to 60%. Wall motion was normal; there were no regional wall motion abnormalities.  Discharge Instructions  Discharge Orders    Future Appointments: Provider: Department: Dept Phone: Center:   03/20/2012 12:00 PM [facility] [phone] [place]   03/21/2012 1:00  PM Lurline Hare, MD [facility] RAD[facility]ATION ONCOLOGY [phone] None     Future Orders Please Complete By Expires   Diet - low sodium heart healthy      Increase activity slowly          Medication List     As of 01/01/2012  1:47 PM    STOP taking these medications         aspirin EC 81 MG tablet      TAKE these medications         allopurinol 300 MG tablet   Commonly known as: ZYLOPRIM   Take 300 mg by mouth every morning.      amLODipine 10 MG tablet   Commonly known as: NORVASC   Take 1 tablet (10 mg total) by mouth daily.      aspirin 325 MG tablet   Take 1 tablet (325 mg total) by mouth daily.      clopidogrel 75 MG tablet   Commonly known as: PLAVIX   Take 75 mg by mouth every morning.      divalproex 250 MG DR tablet   Commonly known as: DEPAKOTE   Take 2 tablets (500 mg total) by mouth 2 (two) times daily.      escitalopram 10 MG tablet   Commonly known as: LEXAPRO   Take 10 mg by mouth every morning.      hydrALAZINE 50 MG tablet   Commonly known as: APRESOLINE   Take 1 tablet (50 mg total) by mouth 2 (two) times daily.      meclizine 50 MG tablet   Commonly known as: ANTIVERT   Take 25 mg by mouth 2 (two) times daily.      simvastatin 40 MG tablet   Commonly known as: ZOCOR   Take 40 mg by mouth at bedtime.      TAB-A-VITE PO   Take 1 tablet by mouth every morning.         The results of significant diagnostics from this hospitalization (including imaging, microbiology, ancillary and laboratory) are listed below for reference.    Significant Diagnostic Studies: Ct Head Wo Contrast  12/29/2011  *RADIOLOGY REPORT*  Clinical Data: Seizure activity  CT HEAD WITHOUT CONTRAST  Technique:  Contiguous axial images were obtained from the base of the skull through the vertex without contrast.  Comparison: Brain MRI 12/28/2011  Findings: There is a cortical infarction in the high right frontal lobe along the motor strip (image 25)  which is demonstrated on comparison MRI.   No associated hemorrhage.  Remote infarction in the more anterior left frontal lobe.  There is extensive periventricular white matter hypodensities.  There is ventriculomegaly similar to prior.  Extensive white matter disease in the basal ganglia and deep white matter tracts.  No intracranial hemorrhage.  No midline shift or mass effect.  IMPRESSION:  1.  Small right to  frontal cortical infarction is now evident.  2.   No evidence of mass effect or hemorrhage.  3.  Extensive microvascular disease and ventriculomegaly is unchanged.   Original Report Authenticated By: Genevive Bi, M.D.    Ct Head Wo Contrast  12/27/2011  *RADIOLOGY REPORT*  Clinical Data: Code stroke, left arm paralysis  CT HEAD WITHOUT CONTRAST  Technique:  Contiguous axial images were obtained  from the base of the skull through the vertex without contrast.  Comparison: MRI 11/18/2011 and head CT 11/18/2011  Findings: Marked diffuse cortical volume loss persists with proportional ventricular prominence.  Left frontoparietal encephalomalacia again noted.  Remote right caudate and bilateral external capsule lacunar infarcts are noted.  No midline shift.  No acute hemorrhage, acute infarction, or mass lesion is seen.  Mild ethmoid and left maxillary mucoperiosteal thickening.  IMPRESSION: Stable findings as above, no acute intracranial finding.  Mild sinusitis.  Results called to Dr. Amada Jupiter by Dr. Chilton Si on 12/27/2011 at 1:09 p.m.   Original Report Authenticated By: Christiana Pellant, M.D.    Mr Brain Wo Contrast  12/28/2011  *RADIOLOGY REPORT*  Clinical Data:  78 year old male with prior stroke.  Left upper extremity weakness.  Comparison: Brain MRI and MRA 11/18/2011 and earlier.  MRI HEAD WITHOUT CONTRAST  Technique: Multiplanar, multiecho pulse sequences of the brain and surrounding structures were obtained according to standard protocol without intravenous contrast.  Findings: Approximately 2  cm confluent cortically based infarct at the right superior frontal gyrus, motor strip.  Subcortical white matter extension.  Occasional other scattered small foci of ischemia in the nearby right parietal lobe .  Mild associated cytotoxic edema.  No mass effect or hemorrhage.  Faint residual diffusion abnormality at the site of the smaller November white matter infarct.  Diffusion elsewhere is within normal limits.  Major intracranial vascular flow voids are stable.  Stable gray and white matter signal elsewhere.  Remote superior left MCA infarct with encephalomalacia, a similar sized the today's lesion.  Chronic small vessel disease in the pons.  Stable visualized cervical spine, negative except for evidence of previous ACDF beginning at C3.  Normal bone marrow signal. Stable ventricle size and configuration.  Negative pituitary and cervicomedullary junction.  Stable orbit soft tissues.  Decreased mild paranasal sinus mucosal thickening.  Mastoids are clear.  Negative scalp soft tissues.  IMPRESSION: 1.  2 cm acute infarct of the right motor strip and surrounding white matter.  No mass effect or hemorrhage. 2.  Interval expected evolution of the small nearby right corona radiata infarct from November.  Underlying chronic ischemia. 3.  MRA findings are below.  MRA HEAD WITHOUT CONTRAST  Technique: Angiographic images of the Circle of Willis were obtained using MRA technique without  intravenous contrast.  Findings: Stable antegrade flow in the posterior circulation supplied by the distal right vertebral artery.  Preserved bilateral PICA flow.  No significant flow in the distal left vertebral artery, unchanged and on the previous study appeared related to functional termination of that vessel in the left PICA.  Stable basilar artery without stenosis.  SCA and PCA origins are patent and stable.  Fetal type left PCA origin.  Visualized bilateral PCA branches are stable.  Motion artifact affecting the ICA siphons.   Stable antegrade flow in the anterior circulation.  No definite ICA stenosis.  Carotid termini remain patent.  MCA and ACA origins are stable.  Anterior communicating artery is diminutive.  Visualized bilateral ACA branches are stable and within normal limits.  The visualized bilateral MCA branches are stable with mild irregularity compatible with atherosclerosis.  IMPRESSION:  Stable intracranial MRA since November. No right MCA focal stenosis or major branch occlusion.   Original Report Authenticated By: Erskine Speed, M.D.      Microbiology: Recent Results (from the past 240 hour(s))  MRSA PCR SCREENING     Status: Abnormal   Collection Time   12/27/11  8:42 PM      Component Value Range Status Comment   MRSA by PCR POSITIVE (*) NEGATIVE Final      Labs: Basic Metabolic Panel:  Lab 12/29/11 6213 12/27/11 1816 12/27/11 1454 12/27/11 1438  NA 138 -- 141 137  K 4.1 -- 4.4 4.2  CL 102 -- 104 101  CO2 28 -- -- 28  GLUCOSE 98 -- 92 95  BUN 21 -- 29* 27*  CREATININE 1.16 1.44* 1.90* 1.73*  CALCIUM 9.1 -- -- 9.4  MG -- -- -- --  PHOS -- -- -- --   Liver Function Tests:  Lab 12/27/11 1438  AST 18  ALT 7  ALKPHOS 50  BILITOT 0.4  PROT 6.9  ALBUMIN 3.0*   CBC:  Lab 12/27/11 1816 12/27/11 1454 12/27/11 1438  WBC 3.2* -- 3.3*  NEUTROABS -- -- 1.7  HGB 12.8* 13.9 12.7*  HCT 41.6 41.0 39.4  MCV 89.3 -- 89.1  PLT 143* -- 143*   Cardiac Enzymes:  Lab 12/27/11 1438  CKTOTAL --  CKMB --  CKMBINDEX --  TROPONINI <0.30   CBG:  Lab 12/27/11 2311 12/27/11 1353  GLUCAP 85 82    Principal Problem:  *Acute ischemic stroke Active Problems:  Parkinson disease  Left arm weakness  Accelerated hypertension  Dehydration  Seizure  Dementia   Time coordinating discharge:35 minutes   Signed:  Brendia Sacks, MD [facility] 01/01/2012, 1:47 PM

## 2012-03-20 ENCOUNTER — Ambulatory Visit (HOSPITAL_COMMUNITY): Payer: Medicare Other

## 2012-03-21 ENCOUNTER — Telehealth: Payer: Self-pay | Admitting: *Deleted

## 2012-03-21 ENCOUNTER — Ambulatory Visit: Payer: Medicare Other | Admitting: Radiation Oncology

## 2012-03-21 NOTE — Telephone Encounter (Signed)
CALLED PATIENT TO ASK QUESTION ABOUT MISSING SCAN, LVM FOR A RETURN CALL.

## 2012-03-21 NOTE — Telephone Encounter (Signed)
XXXX 

## 2012-05-01 ENCOUNTER — Ambulatory Visit: Payer: Self-pay | Admitting: Neurology

## 2013-04-29 ENCOUNTER — Emergency Department (HOSPITAL_COMMUNITY): Payer: Medicare Other

## 2013-04-29 ENCOUNTER — Emergency Department (HOSPITAL_COMMUNITY)
Admission: EM | Admit: 2013-04-29 | Discharge: 2013-04-29 | Disposition: A | Discharge status: Home / Self Care | Payer: Medicare Other | Attending: Emergency Medicine | Admitting: Emergency Medicine

## 2013-04-29 ENCOUNTER — Encounter (HOSPITAL_COMMUNITY): Payer: Self-pay | Admitting: Emergency Medicine

## 2013-04-29 DIAGNOSIS — I2510 Atherosclerotic heart disease of native coronary artery without angina pectoris: Secondary | ICD-10-CM | POA: Insufficient documentation

## 2013-04-29 DIAGNOSIS — Z87448 Personal history of other diseases of urinary system: Secondary | ICD-10-CM | POA: Insufficient documentation

## 2013-04-29 DIAGNOSIS — F0281 Dementia in other diseases classified elsewhere with behavioral disturbance: Secondary | ICD-10-CM | POA: Insufficient documentation

## 2013-04-29 DIAGNOSIS — G20 Parkinson's disease: Secondary | ICD-10-CM | POA: Insufficient documentation

## 2013-04-29 DIAGNOSIS — I10 Essential (primary) hypertension: Secondary | ICD-10-CM | POA: Insufficient documentation

## 2013-04-29 DIAGNOSIS — F02818 Dementia in other diseases classified elsewhere, unspecified severity, with other behavioral disturbance: Secondary | ICD-10-CM | POA: Insufficient documentation

## 2013-04-29 DIAGNOSIS — R944 Abnormal results of kidney function studies: Secondary | ICD-10-CM | POA: Insufficient documentation

## 2013-04-29 DIAGNOSIS — G20A1 Parkinson's disease without dyskinesia, without mention of fluctuations: Secondary | ICD-10-CM | POA: Insufficient documentation

## 2013-04-29 DIAGNOSIS — Z8673 Personal history of transient ischemic attack (TIA), and cerebral infarction without residual deficits: Secondary | ICD-10-CM | POA: Insufficient documentation

## 2013-04-29 DIAGNOSIS — R4182 Altered mental status, unspecified: Secondary | ICD-10-CM

## 2013-04-29 DIAGNOSIS — Z85118 Personal history of other malignant neoplasm of bronchus and lung: Secondary | ICD-10-CM | POA: Insufficient documentation

## 2013-04-29 DIAGNOSIS — Z7982 Long term (current) use of aspirin: Secondary | ICD-10-CM | POA: Insufficient documentation

## 2013-04-29 DIAGNOSIS — M109 Gout, unspecified: Secondary | ICD-10-CM | POA: Insufficient documentation

## 2013-04-29 DIAGNOSIS — Z9889 Other specified postprocedural states: Secondary | ICD-10-CM | POA: Insufficient documentation

## 2013-04-29 DIAGNOSIS — I209 Angina pectoris, unspecified: Secondary | ICD-10-CM | POA: Insufficient documentation

## 2013-04-29 DIAGNOSIS — Z87891 Personal history of nicotine dependence: Secondary | ICD-10-CM | POA: Insufficient documentation

## 2013-04-29 DIAGNOSIS — Z79899 Other long term (current) drug therapy: Secondary | ICD-10-CM | POA: Insufficient documentation

## 2013-04-29 DIAGNOSIS — Z7902 Long term (current) use of antithrombotics/antiplatelets: Secondary | ICD-10-CM | POA: Insufficient documentation

## 2013-04-29 DIAGNOSIS — Z9861 Coronary angioplasty status: Secondary | ICD-10-CM | POA: Insufficient documentation

## 2013-04-29 DIAGNOSIS — I959 Hypotension, unspecified: Secondary | ICD-10-CM | POA: Insufficient documentation

## 2013-04-29 LAB — COMPREHENSIVE METABOLIC PANEL
ALT: <5 U/L (ref 0–53)
AST: 15 U/L (ref 0–37)
Albumin: 2.9 g/dL — ABNORMAL LOW (ref 3.5–5.2)
Alkaline Phosphatase: 53 U/L (ref 39–117)
BUN: 39 mg/dL — ABNORMAL HIGH (ref 6–23)
CALCIUM: 9.7 mg/dL (ref 8.4–10.5)
CO2: 28 meq/L (ref 19–32)
CREATININE: 1.89 mg/dL — AB (ref 0.50–1.35)
Chloride: 103 mEq/L (ref 96–112)
GFR calc non Af Amer: 30 mL/min — ABNORMAL LOW (ref >90)
GFR, EST AFRICAN AMERICAN: 35 mL/min — AB (ref >90)
Glucose, Bld: 97 mg/dL (ref 70–99)
Potassium: 4.2 mEq/L (ref 3.7–5.3)
Sodium: 143 mEq/L (ref 137–147)
Total Bilirubin: 0.3 mg/dL (ref 0.3–1.2)
Total Protein: 7.5 g/dL (ref 6.0–8.3)

## 2013-04-29 LAB — URINALYSIS, ROUTINE W REFLEX MICROSCOPIC
Bilirubin Urine: NEGATIVE
Glucose, UA: NEGATIVE mg/dL
Ketones, ur: NEGATIVE mg/dL
LEUKOCYTES UA: NEGATIVE
NITRITE: NEGATIVE
PROTEIN: NEGATIVE mg/dL
Specific Gravity, Urine: 1.018 (ref 1.005–1.030)
UROBILINOGEN UA: 1 mg/dL (ref 0.0–1.0)
pH: 6 (ref 5.0–8.0)

## 2013-04-29 LAB — CBC
HEMATOCRIT: 36.8 % — AB (ref 39.0–52.0)
Hemoglobin: 11.6 g/dL — ABNORMAL LOW (ref 13.0–17.0)
MCH: 27.9 pg (ref 26.0–34.0)
MCHC: 31.5 g/dL (ref 30.0–36.0)
MCV: 88.5 fL (ref 78.0–100.0)
PLATELETS: 185 10*3/uL (ref 150–400)
RBC: 4.16 MIL/uL — AB (ref 4.22–5.81)
RDW: 15 % (ref 11.5–15.5)
WBC: 3.6 10*3/uL — AB (ref 4.0–10.5)

## 2013-04-29 LAB — I-STAT CG4 LACTIC ACID, ED: LACTIC ACID, VENOUS: 1.48 mmol/L (ref 0.5–2.2)

## 2013-04-29 LAB — URINE MICROSCOPIC-ADD ON

## 2013-04-29 LAB — I-STAT TROPONIN, ED: Troponin i, poc: 0.01 ng/mL (ref 0.00–0.08)

## 2013-04-29 MED ORDER — SODIUM CHLORIDE 0.9 % IV BOLUS (SEPSIS)
500.0000 mL | Freq: Once | INTRAVENOUS | Status: AC
  Administered 2013-04-29: 500 mL via INTRAVENOUS

## 2013-04-29 NOTE — ED Notes (Signed)
Patient transported to CT 

## 2013-04-29 NOTE — ED Notes (Signed)
Results of lactic acid given to primary nurse Gerald Stabs

## 2013-04-29 NOTE — ED Notes (Signed)
Placed to [facility] for transportation back home

## 2013-04-29 NOTE — ED Notes (Signed)
PT from [facility], sent for eval of altered mental status and hypotension. Per EMS, pt initial bp was 70/p, pt was woke by facility staff around 730 pm, staff noticed pt "not normal" around 8 am. Pt BP increased with trendelenberg to 199/61 by EMS. Pt more responsive, opens eyes to verbal stiumli and moving extremities at this time.

## 2013-04-29 NOTE — ED Provider Notes (Signed)
CSN: [id number]     Arrival date & time 04/29/13  5329 History   First MD Initiated Contact with Patient 04/29/13 [phone]     Chief Complaint  Patient presents with  . Altered Mental Status  . Hypotension     (Consider location/radiation/quality/duration/timing/severity/associated sxs/prior Treatment) HPI  This is an 80 year old male with history of hypertension, stroke, Parkinson's disease, coronary artery disease, dementia who presents with altered mental status and reported hypotension. Per the patient's son, he received a call from [facility] this morning that his father was eating breakfast. He appear to be keeping his food in his mouth and staring out blankly. Blood pressure was noted by EMS to be 70/p. This improved with Trendelenburg to 199/61. Currently patient is normotensive at 128/64. Patient is largely noncontributory to history taking. He is oriented x2 which is reported at his baseline. The son states that recently he has been getting occupational therapy.  Also the son reports that the patient has had recent changes in his speech pattern and that is not new this morning.  They deny any recent illnesses. The patient denies any pain.  Level 5 caveat  Past Medical History  Diagnosis Date  . Lung cancer dx'd 2006    surg only  . Hypertension   . Stroke   . Parkinson disease   . Coronary artery disease   . Gout   . Dementia   . Angina   . Shortness of breath   . TIA (transient ischemic attack)   . Renal disorder Chronic Kidney disease   Past Surgical History  Procedure Laterality Date  . Lasik    . Coronary angioplasty with stent placement    . Lobectomy     History reviewed. No pertinent family history. History  Substance Use Topics  . Smoking status: Former Research scientist (life sciences)  . Smokeless tobacco: Never Used  . Alcohol Use: No    Review of Systems  Unable to perform ROS: Dementia      Allergies  Review of patient's allergies indicates no known allergies.  Home  Medications   Prior to Admission medications   Medication Sig Start Date End Date Taking? Authorizing Provider  allopurinol (ZYLOPRIM) 300 MG tablet Take 300 mg by mouth every morning.    Yes Historical Provider, MD  aspirin 81 MG chewable tablet Chew 81 mg by mouth daily.   Yes Historical Provider, MD  carbidopa-levodopa (SINEMET IR) 25-100 MG per tablet Take 1 tablet by mouth 3 (three) times daily.   Yes Historical Provider, MD  clopidogrel (PLAVIX) 75 MG tablet Take 75 mg by mouth every morning.    Yes Historical Provider, MD  divalproex (DEPAKOTE SPRINKLE) 125 MG capsule Take 125 mg by mouth 3 (three) times daily.   Yes Historical Provider, MD  escitalopram (LEXAPRO) 10 MG tablet Take 10 mg by mouth every morning.    Yes Historical Provider, MD  furosemide (LASIX) 20 MG tablet Take 10 mg by mouth daily.   Yes Historical Provider, MD  hydrALAZINE (APRESOLINE) 50 MG tablet Take 1 tablet (50 mg total) by mouth 2 (two) times daily. 01/01/12  Yes Samuella Cota, MD  HYDROcodone-acetaminophen (NORCO/VICODIN) 5-325 MG per tablet Take 1 tablet by mouth every 8 (eight) hours as needed for moderate pain.   Yes Historical Provider, MD  lisinopril (PRINIVIL,ZESTRIL) 5 MG tablet Take 5 mg by mouth daily.   Yes Historical Provider, MD  meclizine (ANTIVERT) 25 MG tablet Take 12.5 mg by mouth 2 (two) times daily.  Yes Historical Provider, MD  Multiple Vitamin (TAB-A-VITE PO) Take 1 tablet by mouth every morning.   Yes Historical Provider, MD  Nutritional Supplements (ENSURE COMPLETE PO) Take 1 Can by mouth daily. At 2pm between meals   Yes Historical Provider, MD  simvastatin (ZOCOR) 20 MG tablet Take 20 mg by mouth daily.   Yes Historical Provider, MD   BP 169/75  Pulse 70  Temp(Src) 97.8 F (36.6 C) (Oral)  Resp 16  SpO2 99% Physical Exam  Nursing note and vitals reviewed. Constitutional: No distress.  Elderly  HENT:  Head: Normocephalic and atraumatic.  Mucous membranes dry, patient 34  old food  Eyes: EOM are normal. Pupils are equal, round, and reactive to light.  Neck: Neck supple.  Cardiovascular: Normal rate, regular rhythm and normal heart sounds.   Pulmonary/Chest: Effort normal and breath sounds normal. No respiratory distress. He has no wheezes.  Abdominal: Soft. Bowel sounds are normal. There is no tenderness. There is no rebound.  Musculoskeletal: He exhibits no edema.  Lymphadenopathy:    He has no cervical adenopathy.  Neurological: He is alert.  Oriented x2, follows simple commands, able to name and repeat, moves all 4 extremities  Skin: Skin is warm and dry.    ED Course  Procedures (including critical care time) Labs Review Labs Reviewed  CBC - Abnormal; Notable for the following:    WBC 3.6 (*)    RBC 4.16 (*)    Hemoglobin 11.6 (*)    HCT 36.8 (*)    All other components within normal limits  COMPREHENSIVE METABOLIC PANEL - Abnormal; Notable for the following:    BUN 39 (*)    Creatinine, Ser 1.89 (*)    Albumin 2.9 (*)    GFR calc non Af Amer 30 (*)    GFR calc Af Amer 35 (*)    All other components within normal limits  URINALYSIS, ROUTINE W REFLEX MICROSCOPIC - Abnormal; Notable for the following:    Hgb urine dipstick SMALL (*)    All other components within normal limits  URINE MICROSCOPIC-ADD ON - Abnormal; Notable for the following:    Bacteria, UA FEW (*)    Casts HYALINE CASTS (*)    All other components within normal limits  URINE CULTURE  I-STAT TROPOININ, ED  I-STAT CG4 LACTIC ACID, ED    Imaging Review Dg Wrist Complete Left  04/29/2013   CLINICAL DATA:  Left wrist pain with tenderness to touch.  EXAM: LEFT WRIST - COMPLETE 3+ VIEW  COMPARISON:  None.  FINDINGS: There is no acute fracture or dislocation. Is diffuse osteopenia with cystic degenerative changes throughout the carpal bones with slight osteoarthritis of the first carpal metacarpal joint as well as osteoarthritis of the radiocarpal joint. There is a slight ulnar  plus variance. There is soft tissue swelling around the wrist with suggestion of an effusion. There is suggestion of some bony erosions. The possibility of gout should be considered.  IMPRESSION: No acute osseous abnormality. Arthritic changes with cysts and erosions and a probable radiocarpal joint effusion. Inflammatory arthritis should be considered.   Electronically Signed   By: Rozetta Nunnery M.D.   On: 04/29/2013 10:47   Ct Head Wo Contrast  04/29/2013   CLINICAL DATA:  ALTERED MENTAL STATUS HYPOTENSION  EXAM: CT HEAD WITHOUT CONTRAST  TECHNIQUE: Contiguous axial images were obtained from the base of the skull through the vertex without intravenous contrast.  COMPARISON:  CT HEAD W/O CM dated 12/29/2011  FINDINGS: Stable  chronic areas of remote infarction posterior vertex right frontal lobe lateral anterior vertex left frontal lobe. No extra-axial fluid collection nor acute hemorrhage. Global age-appropriate atrophy. Diffuse areas of low attenuation within the subcortical, deep, periventricular white matter regions, basal ganglial regions, and thalami consistent with small small vessel white matter ischaemia. There is no evidence of mass effect. Stable ventriculomegaly. The visualized paranasal sinuses and mastoid air cells are patent.  IMPRESSION: Chronic and involutional changes without acute abnormalities.   Electronically Signed   By: Margaree Mackintosh M.D.   On: 04/29/2013 10:33   Dg Chest Portable 1 View  04/29/2013   CLINICAL DATA:  Altered mental status. Shortness of breath. Hypotension. History of lung cancer and lobectomy.  EXAM: PORTABLE CHEST - 1 VIEW  COMPARISON:  12/28/2011  FINDINGS: Heart size and pulmonary vascularity are normal. Postsurgical changes in the left lung are stable. Left main pulmonary artery is chronically prominent. There is stable scarring in the right midzone.  Chronic enlargement of the right lobe of the thyroid gland with slight tracheal deviation to the left, unchanged  since 05/28/2008.  IMPRESSION: No acute abnormalities.   Electronically Signed   By: Rozetta Nunnery M.D.   On: 04/29/2013 10:30     EKG Interpretation   Date/Time:  Tuesday April 29 2013 09:27:26 EDT Ventricular Rate:  67 PR Interval:  270 QRS Duration: 159 QT Interval:  461 QTC Calculation: 487 R Axis:   -75 Text Interpretation:  Sinus rhythm Prolonged PR interval RBBB and LAFB  Similar to prior Confirmed by HORTON  MD, [place] ([id number]) on 04/29/2013  9:30:04 AM      MDM   Final diagnoses:  Altered mental status   Patient presents with reported altered mental status and hypotension. Blood pressure here within normal limits. Son is at the bedside. He states that his father is close to his baseline. He is nontoxic and nonfocal on exam.  Workup is largely unremarkable. Lactate is normal. No signs of infection. Creatinine is at baseline; however BUN is mildly elevated at 39. This may be contributing to patient's current change. Patient was given 500 cc of fluid. On recheck, patient son states that he is at his baseline. CT head is negative. After discussion with the son, he feels comfortable with that returning to his living facility. At this time there are no signs of stroke, infection, or trauma.  After history, exam, and medical workup I feel the patient has been appropriately medically screened and is safe for discharge home. Pertinent diagnoses were discussed with the patient. Patient was given return precautions.    Merryl Hacker, MD 04/30/13 [phone]

## 2013-04-29 NOTE — ED Notes (Signed)
PT MOVED TO [facility] C TO AWAIT PTR TO TRANSPORT HIM BACK TO [facility]

## 2013-04-29 NOTE — Discharge Instructions (Signed)
Confusion Confusion is the inability to think with your usual speed or clarity. Confusion may come on quickly or slowly over time. How quickly the confusion comes on depends on the cause. Confusion can be due to any number of causes. CAUSES   Concussion, head injury, or head trauma.  Seizures.  Stroke.  Fever.  Senility.  Heightened emotional states like rage or terror.  Mental illness in which the person loses the ability to determine what is real and what is not (hallucinations).  Infections.  Toxic effects from alcohol, drugs, or prescription medicines.  Dehydration and an imbalance of salts in the body (electrolytes).  Lack of sleep.  Low blood sugar (diabetes).  Low levels of oxygen (for example from chronic lung disorders).  Drug interactions or other medication side effects.  Nutritional deficiencies, especially niacin, thiamine, vitamin C, or vitamin B.  Sudden drop in body temperature (hypothermia).  Illness in the elderly. Constipation can result in confusion. An elderly person who is hospitalized may become confused due to change in daily routine. SYMPTOMS  People often describe their thinking as cloudy or unclear when they are confused. Confusion can also include feeling disoriented. That means you are unaware of where or who you are. You may also not know what the date or time is. If confused, you may also have difficulty paying attention, remembering and making decisions. Some people also act aggressively when they are confused.  DIAGNOSIS  The medical evaluation of confusion may include:  Blood and urine tests.  X-rays.  Brain and nervous system tests.  Analyzing your brain waves (electroencphalogram or EEG).  A special X-ray (MRI) of your head or other special studies. Your physician will ask questions such as:  Do you get days and nights mixed up?  Are you awake during regular sleep times?  Do you have trouble recognizing people?  Do you  know where you are?  Do you know the date and time?  Does the confusion come and go?  Is the confusion quickly getting worse?  Has there been a recent illness?  Has there been a recent head injury?  Are you diabetic?  Do you have a lung disorder?  What medication are you taking?  Have you taken drugs or alcohol? TREATMENT  An admission to the hospital may not be needed, but a confused person should not be left alone. Stay with a family member or friend until the confusion clears. Avoid alcohol, pain relievers or sedative drugs until you have fully recovered. Do not drive until your caregiver says it is okay. HOME CARE INSTRUCTIONS What family and friends can do:  To find out if someone is confused ask him or her their name, age, and the date. If the person is unsure or answers incorrectly, he or she is confused.  Always introduce yourself, no matter how well the person knows you.  Often remind the person of his or her location.  Place a calendar and clock near the confused person.  Talk about current events and plans for the day.  Try to keep the environment calm, quiet and peaceful.  Make sure the patient keeps follow up appointments with their physician. PREVENTION  Ways to prevent confusion:  Avoid alcohol.  Eat a balanced diet.  Get enough sleep.  Do not become isolated. Spend time with other people and make plans for your days.  Keep careful watch on your blood sugar levels if you are diabetic. SEEK IMMEDIATE MEDICAL CARE IF:   You develop  severe headaches, repeated vomiting, seizures, blackouts or slurred speech.  There is increasing confusion, weakness, numbness, restlessness or personality changes.  You develop a loss of balance, have marked dizziness, feel uncoordinated or fall.  You have delusions, hallucinations or develop severe anxiety.  Your family members think you need to be rechecked. Document Released: 01/27/2004 Document Revised:  03/13/2011 Document Reviewed: 09/24/2007 [facility] Patient Information 2014 [place], [place].

## 2013-04-30 LAB — URINE CULTURE

## 2013-05-31 ENCOUNTER — Emergency Department (HOSPITAL_COMMUNITY): Payer: Medicare Other

## 2013-05-31 ENCOUNTER — Encounter (HOSPITAL_COMMUNITY): Payer: Self-pay | Admitting: Emergency Medicine

## 2013-05-31 ENCOUNTER — Inpatient Hospital Stay (HOSPITAL_COMMUNITY)
Admission: EM | Admit: 2013-05-31 | Discharge: 2013-06-05 | DRG: 179 | Disposition: A | Discharge status: Hospice | Payer: Medicare Other | Attending: Internal Medicine | Admitting: Internal Medicine

## 2013-05-31 DIAGNOSIS — Z7982 Long term (current) use of aspirin: Secondary | ICD-10-CM

## 2013-05-31 DIAGNOSIS — I2510 Atherosclerotic heart disease of native coronary artery without angina pectoris: Secondary | ICD-10-CM | POA: Diagnosis present

## 2013-05-31 DIAGNOSIS — R29898 Other symptoms and signs involving the musculoskeletal system: Secondary | ICD-10-CM

## 2013-05-31 DIAGNOSIS — G40909 Epilepsy, unspecified, not intractable, without status epilepticus: Secondary | ICD-10-CM | POA: Diagnosis present

## 2013-05-31 DIAGNOSIS — R0602 Shortness of breath: Secondary | ICD-10-CM

## 2013-05-31 DIAGNOSIS — Z85118 Personal history of other malignant neoplasm of bronchus and lung: Secondary | ICD-10-CM

## 2013-05-31 DIAGNOSIS — Z515 Encounter for palliative care: Secondary | ICD-10-CM

## 2013-05-31 DIAGNOSIS — Z7902 Long term (current) use of antithrombotics/antiplatelets: Secondary | ICD-10-CM

## 2013-05-31 DIAGNOSIS — R509 Fever, unspecified: Secondary | ICD-10-CM | POA: Diagnosis present

## 2013-05-31 DIAGNOSIS — Z66 Do not resuscitate: Secondary | ICD-10-CM | POA: Diagnosis present

## 2013-05-31 DIAGNOSIS — Z79899 Other long term (current) drug therapy: Secondary | ICD-10-CM

## 2013-05-31 DIAGNOSIS — I129 Hypertensive chronic kidney disease with stage 1 through stage 4 chronic kidney disease, or unspecified chronic kidney disease: Secondary | ICD-10-CM | POA: Diagnosis present

## 2013-05-31 DIAGNOSIS — R4182 Altered mental status, unspecified: Secondary | ICD-10-CM | POA: Diagnosis present

## 2013-05-31 DIAGNOSIS — N179 Acute kidney failure, unspecified: Secondary | ICD-10-CM

## 2013-05-31 DIAGNOSIS — R569 Unspecified convulsions: Secondary | ICD-10-CM

## 2013-05-31 DIAGNOSIS — R319 Hematuria, unspecified: Secondary | ICD-10-CM | POA: Diagnosis present

## 2013-05-31 DIAGNOSIS — M109 Gout, unspecified: Secondary | ICD-10-CM | POA: Diagnosis present

## 2013-05-31 DIAGNOSIS — Z87891 Personal history of nicotine dependence: Secondary | ICD-10-CM

## 2013-05-31 DIAGNOSIS — D638 Anemia in other chronic diseases classified elsewhere: Secondary | ICD-10-CM | POA: Diagnosis present

## 2013-05-31 DIAGNOSIS — G20A1 Parkinson's disease without dyskinesia, without mention of fluctuations: Secondary | ICD-10-CM | POA: Diagnosis present

## 2013-05-31 DIAGNOSIS — I639 Cerebral infarction, unspecified: Secondary | ICD-10-CM

## 2013-05-31 DIAGNOSIS — R1311 Dysphagia, oral phase: Secondary | ICD-10-CM | POA: Diagnosis present

## 2013-05-31 DIAGNOSIS — J690 Pneumonitis due to inhalation of food and vomit: Secondary | ICD-10-CM | POA: Diagnosis present

## 2013-05-31 DIAGNOSIS — E860 Dehydration: Secondary | ICD-10-CM

## 2013-05-31 DIAGNOSIS — G20 Parkinson's disease: Secondary | ICD-10-CM | POA: Diagnosis present

## 2013-05-31 DIAGNOSIS — C3490 Malignant neoplasm of unspecified part of unspecified bronchus or lung: Secondary | ICD-10-CM

## 2013-05-31 DIAGNOSIS — Z9861 Coronary angioplasty status: Secondary | ICD-10-CM

## 2013-05-31 DIAGNOSIS — I10 Essential (primary) hypertension: Secondary | ICD-10-CM

## 2013-05-31 DIAGNOSIS — F0390 Unspecified dementia without behavioral disturbance: Secondary | ICD-10-CM | POA: Diagnosis present

## 2013-05-31 DIAGNOSIS — N189 Chronic kidney disease, unspecified: Secondary | ICD-10-CM | POA: Diagnosis present

## 2013-05-31 DIAGNOSIS — G459 Transient cerebral ischemic attack, unspecified: Secondary | ICD-10-CM

## 2013-05-31 DIAGNOSIS — D649 Anemia, unspecified: Secondary | ICD-10-CM | POA: Diagnosis present

## 2013-05-31 LAB — URINALYSIS, ROUTINE W REFLEX MICROSCOPIC
Bilirubin Urine: NEGATIVE
GLUCOSE, UA: NEGATIVE mg/dL
KETONES UR: NEGATIVE mg/dL
LEUKOCYTES UA: NEGATIVE
Nitrite: NEGATIVE
PROTEIN: 30 mg/dL — AB
Specific Gravity, Urine: 1.023 (ref 1.005–1.030)
Urobilinogen, UA: 1 mg/dL (ref 0.0–1.0)
pH: 6 (ref 5.0–8.0)

## 2013-05-31 LAB — URINE MICROSCOPIC-ADD ON

## 2013-05-31 LAB — COMPREHENSIVE METABOLIC PANEL
ALT: 9 U/L (ref 0–53)
AST: 17 U/L (ref 0–37)
Albumin: 3.3 g/dL — ABNORMAL LOW (ref 3.5–5.2)
Alkaline Phosphatase: 63 U/L (ref 39–117)
BUN: 33 mg/dL — ABNORMAL HIGH (ref 6–23)
CALCIUM: 9.6 mg/dL (ref 8.4–10.5)
CO2: 28 mEq/L (ref 19–32)
CREATININE: 1.45 mg/dL — AB (ref 0.50–1.35)
Chloride: 100 mEq/L (ref 96–112)
GFR calc Af Amer: 48 mL/min — ABNORMAL LOW (ref >90)
GFR, EST NON AFRICAN AMERICAN: 41 mL/min — AB (ref >90)
GLUCOSE: 111 mg/dL — AB (ref 70–99)
Potassium: 4.3 mEq/L (ref 3.7–5.3)
Sodium: 139 mEq/L (ref 137–147)
Total Bilirubin: 0.3 mg/dL (ref 0.3–1.2)
Total Protein: 8.1 g/dL (ref 6.0–8.3)

## 2013-05-31 LAB — PROTIME-INR
INR: 1.01 (ref 0.00–1.49)
Prothrombin Time: 13.1 seconds (ref 11.6–15.2)

## 2013-05-31 LAB — VALPROIC ACID LEVEL: Valproic Acid Lvl: 26.4 ug/mL — ABNORMAL LOW (ref 50.0–100.0)

## 2013-05-31 LAB — DIFFERENTIAL
Basophils Absolute: 0 10*3/uL (ref 0.0–0.1)
Basophils Relative: 0 % (ref 0–1)
EOS PCT: 3 % (ref 0–5)
Eosinophils Absolute: 0.1 10*3/uL (ref 0.0–0.7)
LYMPHS ABS: 0.8 10*3/uL (ref 0.7–4.0)
Lymphocytes Relative: 14 % (ref 12–46)
MONO ABS: 0.7 10*3/uL (ref 0.1–1.0)
Monocytes Relative: 12 % (ref 3–12)
Neutro Abs: 3.8 10*3/uL (ref 1.7–7.7)
Neutrophils Relative %: 71 % (ref 43–77)

## 2013-05-31 LAB — CBC
HEMATOCRIT: 34.8 % — AB (ref 39.0–52.0)
Hemoglobin: 10.8 g/dL — ABNORMAL LOW (ref 13.0–17.0)
MCH: 27.6 pg (ref 26.0–34.0)
MCHC: 31 g/dL (ref 30.0–36.0)
MCV: 88.8 fL (ref 78.0–100.0)
Platelets: 201 10*3/uL (ref 150–400)
RBC: 3.92 MIL/uL — AB (ref 4.22–5.81)
RDW: 16.1 % — ABNORMAL HIGH (ref 11.5–15.5)
WBC: 5.3 10*3/uL (ref 4.0–10.5)

## 2013-05-31 LAB — I-STAT TROPONIN, ED: Troponin i, poc: 0.05 ng/mL (ref 0.00–0.08)

## 2013-05-31 LAB — CBG MONITORING, ED: Glucose-Capillary: 97 mg/dL (ref 70–99)

## 2013-05-31 LAB — I-STAT CG4 LACTIC ACID, ED: Lactic Acid, Venous: 2.19 mmol/L (ref 0.5–2.2)

## 2013-05-31 LAB — PRO B NATRIURETIC PEPTIDE: Pro B Natriuretic peptide (BNP): 1473 pg/mL — ABNORMAL HIGH (ref 0–450)

## 2013-05-31 LAB — APTT: aPTT: 30 seconds (ref 24–37)

## 2013-05-31 MED ORDER — VANCOMYCIN HCL IN DEXTROSE 1-5 GM/200ML-% IV SOLN
1000.0000 mg | Freq: Once | INTRAVENOUS | Status: AC
  Administered 2013-05-31: 1000 mg via INTRAVENOUS
  Filled 2013-05-31: qty 200

## 2013-05-31 MED ORDER — SODIUM CHLORIDE 0.9 % IV BOLUS (SEPSIS)
500.0000 mL | Freq: Once | INTRAVENOUS | Status: AC
  Administered 2013-05-31: 500 mL via INTRAVENOUS

## 2013-05-31 MED ORDER — PIPERACILLIN-TAZOBACTAM 3.375 G IVPB
3.3750 g | Freq: Three times a day (TID) | INTRAVENOUS | Status: DC
  Administered 2013-06-01 – 2013-06-03 (×8): 3.375 g via INTRAVENOUS
  Filled 2013-05-31 (×10): qty 50

## 2013-05-31 MED ORDER — PIPERACILLIN-TAZOBACTAM 3.375 G IVPB 30 MIN
3.3750 g | Freq: Once | INTRAVENOUS | Status: AC
  Administered 2013-05-31: 3.375 g via INTRAVENOUS
  Filled 2013-05-31: qty 50

## 2013-05-31 MED ORDER — VANCOMYCIN HCL 500 MG IV SOLR
500.0000 mg | INTRAVENOUS | Status: DC
  Administered 2013-06-01 – 2013-06-02 (×2): 500 mg via INTRAVENOUS
  Filled 2013-05-31 (×2): qty 500

## 2013-05-31 MED ORDER — SODIUM CHLORIDE 0.9 % IV SOLN
INTRAVENOUS | Status: DC
  Administered 2013-06-01: via INTRAVENOUS

## 2013-05-31 NOTE — ED Provider Notes (Signed)
CSN: [id number]     Arrival date & time 05/31/13  2057 History   First MD Initiated Contact with Patient 05/31/13 2101     Chief Complaint  Patient presents with  . Altered Mental Status  . Shortness of Breath    @EDPCLEARED @ (Consider location/radiation/quality/duration/timing/severity/associated sxs/prior Treatment) Patient is a 80 y.o. male presenting with altered mental status and shortness of breath. The history is provided by the nursing home, the EMS personnel and a relative. The history is limited by the condition of the patient.  Altered Mental Status Shortness of Breath  level V caveat applies to his altered mental status.  Patient brought in by EMS initially as a code stroke. Seen by myself and neurology. Patient's had multiple strokes in the past but there was needing evident as being new. In discussion with family members and nursing home she is at a greater concern was for shortness of breath. At that maybe he had pneumonia. They were concerned about stroke. Patient does have a lesser degree of his alertness and a decrease in his mental status compared to baseline. He does have a history of dementia. Patient has not walked now for several months. Patient is a full code. He is from [facility]. Dr. Reymundo Poll is his doctor.  Past Medical History  Diagnosis Date  . Lung cancer dx'd 2006    surg only  . Hypertension   . Stroke   . Parkinson disease   . Coronary artery disease   . Gout   . Dementia   . Angina   . Shortness of breath   . TIA (transient ischemic attack)   . Renal disorder Chronic Kidney disease   Past Surgical History  Procedure Laterality Date  . Lasik    . Coronary angioplasty with stent placement    . Lobectomy     History reviewed. No pertinent family history. History  Substance Use Topics  . Smoking status: Former Research scientist (life sciences)  . Smokeless tobacco: Never Used  . Alcohol Use: No    Review of Systems  Unable to perform  ROS Respiratory: Positive for shortness of breath.    level V caveat applies due to to his altered mental status.    Allergies  Review of patient's allergies indicates no known allergies.  Home Medications   Prior to Admission medications   Medication Sig Start Date End Date Taking? Authorizing Provider  allopurinol (ZYLOPRIM) 300 MG tablet Take 300 mg by mouth every morning.     Historical Provider, MD  aspirin 81 MG chewable tablet Chew 81 mg by mouth daily.    Historical Provider, MD  carbidopa-levodopa (SINEMET IR) 25-100 MG per tablet Take 1 tablet by mouth 3 (three) times daily.    Historical Provider, MD  clopidogrel (PLAVIX) 75 MG tablet Take 75 mg by mouth every morning.     Historical Provider, MD  divalproex (DEPAKOTE SPRINKLE) 125 MG capsule Take 125 mg by mouth 3 (three) times daily.    Historical Provider, MD  escitalopram (LEXAPRO) 10 MG tablet Take 10 mg by mouth every morning.     Historical Provider, MD  furosemide (LASIX) 20 MG tablet Take 10 mg by mouth daily.    Historical Provider, MD  hydrALAZINE (APRESOLINE) 50 MG tablet Take 1 tablet (50 mg total) by mouth 2 (two) times daily. 01/01/12   Samuella Cota, MD  HYDROcodone-acetaminophen (NORCO/VICODIN) 5-325 MG per tablet Take 1 tablet by mouth every 8 (eight) hours as needed for moderate pain.  Historical Provider, MD  lisinopril (PRINIVIL,ZESTRIL) 5 MG tablet Take 5 mg by mouth daily.    Historical Provider, MD  meclizine (ANTIVERT) 25 MG tablet Take 12.5 mg by mouth 2 (two) times daily.    Historical Provider, MD  Multiple Vitamin (TAB-A-VITE PO) Take 1 tablet by mouth every morning.    Historical Provider, MD  Nutritional Supplements (ENSURE COMPLETE PO) Take 1 Can by mouth daily. At 2pm between meals    Historical Provider, MD  simvastatin (ZOCOR) 20 MG tablet Take 20 mg by mouth daily.    Historical Provider, MD   BP 178/81  Pulse 97  Temp(Src) 101.5 F (38.6 C) (Rectal)  Resp 21  SpO2 99% Physical  Exam  Nursing note and vitals reviewed. Constitutional: No distress.  HENT:  Head: Normocephalic and atraumatic.  Mucous membranes slightly dry.  Eyes: Conjunctivae are normal. Pupils are equal, round, and reactive to light.  Neck: Normal range of motion.  Cardiovascular: Regular rhythm.   No murmur heard. Tachycardic  Pulmonary/Chest: Effort normal and breath sounds normal. No respiratory distress. He has no wheezes. He has no rales.  Abdominal: Soft. There is no tenderness.  Musculoskeletal: He exhibits no edema.  Neurological:  Awake minimal verbalization. Moving all extremities.  Skin: Skin is warm. No rash noted.    ED Course  Procedures (including critical care time) Labs Review Labs Reviewed  CBC - Abnormal; Notable for the following:    RBC 3.92 (*)    Hemoglobin 10.8 (*)    HCT 34.8 (*)    RDW 16.1 (*)    All other components within normal limits  COMPREHENSIVE METABOLIC PANEL - Abnormal; Notable for the following:    Glucose, Bld 111 (*)    BUN 33 (*)    Creatinine, Ser 1.45 (*)    Albumin 3.3 (*)    GFR calc non Af Amer 41 (*)    GFR calc Af Amer 48 (*)    All other components within normal limits  URINALYSIS, ROUTINE W REFLEX MICROSCOPIC - Abnormal; Notable for the following:    APPearance CLOUDY (*)    Hgb urine dipstick LARGE (*)    Protein, ur 30 (*)    All other components within normal limits  PRO B NATRIURETIC PEPTIDE - Abnormal; Notable for the following:    Pro B Natriuretic peptide (BNP) 1473.0 (*)    All other components within normal limits  URINE MICROSCOPIC-ADD ON - Abnormal; Notable for the following:    Squamous Epithelial / LPF FEW (*)    All other components within normal limits  CULTURE, BLOOD (ROUTINE X 2)  CULTURE, BLOOD (ROUTINE X 2)  URINE CULTURE  PROTIME-INR  APTT  DIFFERENTIAL  CBG MONITORING, ED  I-STAT TROPOININ, ED  I-STAT CG4 LACTIC ACID, ED   Results for orders placed during the hospital encounter of 05/31/13   PROTIME-INR      Result Value Ref Range   Prothrombin Time 13.1  11.6 - 15.2 seconds   INR 1.01  0.00 - 1.49  APTT      Result Value Ref Range   aPTT 30  24 - 37 seconds  CBC      Result Value Ref Range   WBC 5.3  4.0 - 10.5 K/uL   RBC 3.92 (*) 4.22 - 5.81 MIL/uL   Hemoglobin 10.8 (*) 13.0 - 17.0 g/dL   HCT 34.8 (*) 39.0 - 52.0 %   MCV 88.8  78.0 - 100.0 fL   MCH 27.6  26.0 - 34.0 pg   MCHC 31.0  30.0 - 36.0 g/dL   RDW 16.1 (*) 11.5 - 15.5 %   Platelets 201  150 - 400 K/uL  DIFFERENTIAL      Result Value Ref Range   Neutrophils Relative % 71  43 - 77 %   Neutro Abs 3.8  1.7 - 7.7 K/uL   Lymphocytes Relative 14  12 - 46 %   Lymphs Abs 0.8  0.7 - 4.0 K/uL   Monocytes Relative 12  3 - 12 %   Monocytes Absolute 0.7  0.1 - 1.0 K/uL   Eosinophils Relative 3  0 - 5 %   Eosinophils Absolute 0.1  0.0 - 0.7 K/uL   Basophils Relative 0  0 - 1 %   Basophils Absolute 0.0  0.0 - 0.1 K/uL  COMPREHENSIVE METABOLIC PANEL      Result Value Ref Range   Sodium 139  137 - 147 mEq/L   Potassium 4.3  3.7 - 5.3 mEq/L   Chloride 100  96 - 112 mEq/L   CO2 28  19 - 32 mEq/L   Glucose, Bld 111 (*) 70 - 99 mg/dL   BUN 33 (*) 6 - 23 mg/dL   Creatinine, Ser 1.45 (*) 0.50 - 1.35 mg/dL   Calcium 9.6  8.4 - 10.5 mg/dL   Total Protein 8.1  6.0 - 8.3 g/dL   Albumin 3.3 (*) 3.5 - 5.2 g/dL   AST 17  0 - 37 U/L   ALT 9  0 - 53 U/L   Alkaline Phosphatase 63  39 - 117 U/L   Total Bilirubin 0.3  0.3 - 1.2 mg/dL   GFR calc non Af Amer 41 (*) >90 mL/min   GFR calc Af Amer 48 (*) >90 mL/min  URINALYSIS, ROUTINE W REFLEX MICROSCOPIC      Result Value Ref Range   Color, Urine YELLOW  YELLOW   APPearance CLOUDY (*) CLEAR   Specific Gravity, Urine 1.023  1.005 - 1.030   pH 6.0  5.0 - 8.0   Glucose, UA NEGATIVE  NEGATIVE mg/dL   Hgb urine dipstick LARGE (*) NEGATIVE   Bilirubin Urine NEGATIVE  NEGATIVE   Ketones, ur NEGATIVE  NEGATIVE mg/dL   Protein, ur 30 (*) NEGATIVE mg/dL   Urobilinogen, UA 1.0  0.0 -  1.0 mg/dL   Nitrite NEGATIVE  NEGATIVE   Leukocytes, UA NEGATIVE  NEGATIVE  PRO B NATRIURETIC PEPTIDE      Result Value Ref Range   Pro B Natriuretic peptide (BNP) 1473.0 (*) 0 - 450 pg/mL  URINE MICROSCOPIC-ADD ON      Result Value Ref Range   Squamous Epithelial / LPF FEW (*) RARE   WBC, UA 0-2  <3 WBC/hpf   RBC / HPF 21-50  <3 RBC/hpf   Bacteria, UA RARE  RARE   Urine-Other LESS THAN 10 mL OF URINE SUBMITTED    CBG MONITORING, ED      Result Value Ref Range   Glucose-Capillary 97  70 - 99 mg/dL  I-STAT TROPOININ, ED      Result Value Ref Range   Troponin i, poc 0.05  0.00 - 0.08 ng/mL   Comment 3           I-STAT CG4 LACTIC ACID, ED      Result Value Ref Range   Lactic Acid, Venous 2.19  0.5 - 2.2 mmol/L   Results for orders placed during the hospital encounter of 05/31/13  PROTIME-INR      Result Value Ref Range   Prothrombin Time 13.1  11.6 - 15.2 seconds   INR 1.01  0.00 - 1.49  APTT      Result Value Ref Range   aPTT 30  24 - 37 seconds  CBC      Result Value Ref Range   WBC 5.3  4.0 - 10.5 K/uL   RBC 3.92 (*) 4.22 - 5.81 MIL/uL   Hemoglobin 10.8 (*) 13.0 - 17.0 g/dL   HCT 34.8 (*) 39.0 - 52.0 %   MCV 88.8  78.0 - 100.0 fL   MCH 27.6  26.0 - 34.0 pg   MCHC 31.0  30.0 - 36.0 g/dL   RDW 16.1 (*) 11.5 - 15.5 %   Platelets 201  150 - 400 K/uL  DIFFERENTIAL      Result Value Ref Range   Neutrophils Relative % 71  43 - 77 %   Neutro Abs 3.8  1.7 - 7.7 K/uL   Lymphocytes Relative 14  12 - 46 %   Lymphs Abs 0.8  0.7 - 4.0 K/uL   Monocytes Relative 12  3 - 12 %   Monocytes Absolute 0.7  0.1 - 1.0 K/uL   Eosinophils Relative 3  0 - 5 %   Eosinophils Absolute 0.1  0.0 - 0.7 K/uL   Basophils Relative 0  0 - 1 %   Basophils Absolute 0.0  0.0 - 0.1 K/uL  COMPREHENSIVE METABOLIC PANEL      Result Value Ref Range   Sodium 139  137 - 147 mEq/L   Potassium 4.3  3.7 - 5.3 mEq/L   Chloride 100  96 - 112 mEq/L   CO2 28  19 - 32 mEq/L   Glucose, Bld 111 (*) 70 - 99 mg/dL    BUN 33 (*) 6 - 23 mg/dL   Creatinine, Ser 1.45 (*) 0.50 - 1.35 mg/dL   Calcium 9.6  8.4 - 10.5 mg/dL   Total Protein 8.1  6.0 - 8.3 g/dL   Albumin 3.3 (*) 3.5 - 5.2 g/dL   AST 17  0 - 37 U/L   ALT 9  0 - 53 U/L   Alkaline Phosphatase 63  39 - 117 U/L   Total Bilirubin 0.3  0.3 - 1.2 mg/dL   GFR calc non Af Amer 41 (*) >90 mL/min   GFR calc Af Amer 48 (*) >90 mL/min  URINALYSIS, ROUTINE W REFLEX MICROSCOPIC      Result Value Ref Range   Color, Urine YELLOW  YELLOW   APPearance CLOUDY (*) CLEAR   Specific Gravity, Urine 1.023  1.005 - 1.030   pH 6.0  5.0 - 8.0   Glucose, UA NEGATIVE  NEGATIVE mg/dL   Hgb urine dipstick LARGE (*) NEGATIVE   Bilirubin Urine NEGATIVE  NEGATIVE   Ketones, ur NEGATIVE  NEGATIVE mg/dL   Protein, ur 30 (*) NEGATIVE mg/dL   Urobilinogen, UA 1.0  0.0 - 1.0 mg/dL   Nitrite NEGATIVE  NEGATIVE   Leukocytes, UA NEGATIVE  NEGATIVE  PRO B NATRIURETIC PEPTIDE      Result Value Ref Range   Pro B Natriuretic peptide (BNP) 1473.0 (*) 0 - 450 pg/mL  URINE MICROSCOPIC-ADD ON      Result Value Ref Range   Squamous Epithelial / LPF FEW (*) RARE   WBC, UA 0-2  <3 WBC/hpf   RBC / HPF 21-50  <3 RBC/hpf   Bacteria, UA RARE  RARE  Urine-Other LESS THAN 10 mL OF URINE SUBMITTED    CBG MONITORING, ED      Result Value Ref Range   Glucose-Capillary 97  70 - 99 mg/dL  I-STAT TROPOININ, ED      Result Value Ref Range   Troponin i, poc 0.05  0.00 - 0.08 ng/mL   Comment 3           I-STAT CG4 LACTIC ACID, ED      Result Value Ref Range   Lactic Acid, Venous 2.19  0.5 - 2.2 mmol/L     Imaging Review Ct Head (brain) Wo Contrast  05/31/2013   CLINICAL DATA:  80 year old male code stroke. aphasia, found with altered mental status. Initial encounter.  EXAM: CT HEAD WITHOUT CONTRAST  TECHNIQUE: Contiguous axial images were obtained from the base of the skull through the vertex without intravenous contrast.  COMPARISON:  04/29/2013 and earlier.  FINDINGS: Stable parotid  space calcifications. Stable scalp and orbits soft tissues. Mild mucosal thickening in the right maxillary sinus is new along with a small fluid level. Other Visualized paranasal sinuses and mastoids are clear. No acute osseous abnormality identified.  Calcified atherosclerosis at the skull base. Stable cerebral volume. Mild ventriculomegaly and chronic bilateral MCA cortical encephalomalacia. Widespread hypodensity in the thalami I compatible with advanced chronic small vessel ischemia. Less pronounced but similar are hypodensity in the pons. No suspicious intracranial vascular hyperdensity. No evidence of cortically based acute infarction identified. No acute intracranial hemorrhage identified. No midline shift, mass effect, or evidence of intracranial mass lesion.  IMPRESSION: Chronic ischemic disease appears stable. No acute intracranial abnormality.   Electronically Signed   By: Lars Pinks M.D.   On: 05/31/2013 21:16   Dg Chest Port 1 View  05/31/2013   CLINICAL DATA:  Weakness, fever  EXAM: PORTABLE CHEST - 1 VIEW  COMPARISON:  Chest radiograph 04/29/2013  FINDINGS: Stable enlarged cardiac silhouette. There is chronic atelectasis in the right middle lobe. Some volume loss in the left hemi thorax. Normal pulmonary vasculature.  IMPRESSION: No change from prior.  No acute findings.   Electronically Signed   By: Suzy Bouchard M.D.   On: 05/31/2013 21:46     EKG Interpretation   Date/Time:  Saturday May 31 2013 21:16:49 EDT Ventricular Rate:  102 PR Interval:  207 QRS Duration: 144 QT Interval:  362 QTC Calculation: 471 R Axis:   -76 Text Interpretation:  Sinus tachycardia Left atrial enlargement RBBB and  LAFB Artifact in lead(s) I II III aVR aVL aVF V1 V2 V3 Artifact No  significant change since last tracing Confirmed by Shavana Calder  MD, Nathan Moctezuma  [phone]) on 05/31/2013 9:58:58 PM      CRITICAL CARE Performed by: Fredia Sorrow Total critical care time: 30 Critical care time was exclusive  of separately billable procedures and treating other patients. Critical care was necessary to treat or prevent imminent or life-threatening deterioration. Critical care was time spent personally by me on the following activities: development of treatment plan with patient and/or surrogate as well as nursing, discussions with consultants, evaluation of patient's response to treatment, examination of patient, obtaining history from patient or surrogate, ordering and performing treatments and interventions, ordering and review of laboratory studies, ordering and review of radiographic studies, pulse oximetry and re-evaluation of patient's condition.      MDM   Final diagnoses:  Fever  Altered mental status    Meds nursing the home patient noted to have some shortness of breath. The patient's  workup here centered around altered mental status and fever. Patient also with some periods of tachycardia. Lactic acid elevated above 2 but less than 4. Chest x-ray negative for pneumonia. Blood cultures are pending urinalysis negative for UTI. Patient treated empirically with septic antibiotics Zosyn and Vanco. As stated blood cultures are pending. Patient's head CT without acute findings. Patient brought in by EMS for concerns for code stroke and was originally seen by neurology as a code stroke. However we to spoke with the nursing home their main concern was shortness of breath and a fever. That was the same for the family members. Exact etiology of the source of fever is not clear however patient will require admission. Patient is a full code. Patient is a nursing home resident of the [facility] and Dr. Mallie Mussel trip is his doctor at the nursing home. I think that officially makes him an unassigned admission. The patient treated with Zosyn and Vanco stated above. Patient given some fluid boluses. Patient not hypotensive febrile and some intermittent tachycardia. Patient has a markedly elevated BNP so had not  hit him with high by himself fluid.    Fredia Sorrow, MD 05/31/13 [phone]

## 2013-05-31 NOTE — Progress Notes (Signed)
ANTIBIOTIC CONSULT NOTE - INITIAL  Pharmacy Consult for Vancomycin/Zosyn Indication: rule out sepsis  No Known Allergies  ~66 kg  Vital Signs: Temp: 101.5 F (38.6 C) (05/30 2222) Temp src: Rectal (05/30 2130) BP: 178/81 mmHg (05/30 2233) Pulse Rate: 97 (05/30 2233)  Labs:  Recent Labs  05/31/13 2102  WBC 5.3  HGB 10.8*  PLT 201  CREATININE 1.45*   Medical History: Past Medical History  Diagnosis Date  . Lung cancer dx'd 2006    surg only  . Hypertension   . Stroke   . Parkinson disease   . Coronary artery disease   . Gout   . Dementia   . Angina   . Shortness of breath   . TIA (transient ischemic attack)   . Renal disorder Chronic Kidney disease   Assessment: 80 y/o from SNF with fever/congestion, WBC wnl, Scr 1.45, other labs as above.   Goal of Therapy:  Vancomycin trough level 15-20 mcg/ml  Plan:  -Vancomycin 500 mg IV q24h, cautious dosing with low weight/bump in SCr, may increase dose if renal function improves -Zosyn 3.375G IV q8h to be infused over 4 hours -Trend WBC, temp, renal function  -Drug levels as indicated   Narda Bonds 05/31/2013,10:43 PM

## 2013-05-31 NOTE — ED Notes (Signed)
Pt is here by EMS from [facility] for altered mental status, aphasia, difficulty following commands, and blank stare. Pt has hx of stroke in the past with some residual left sided weakness, and contracture to left arm. Pt was last seen normal at 8pm according to staff.

## 2013-05-31 NOTE — Consult Note (Signed)
Neurology Consultation Reason for Consult: Altered mental status Referring Physician: Rogene Houston, S  CC: Shortness of breath  History is obtained from: Patient, family, nursing home  HPI: Christopher Cook is a 80 y.o. male with a history of dementia as well as multiple strokes who was in his normal state of health earlier today. This evening, it is noticed that he was having more congestion, had a fever of 100.8, and was acting a little sleep year and more confused than typical and therefore EMS was called to transport him to the emergency room for evaluation. EMS felt like he wasn't speaking as much as he should be and therefore activated a code stroke with concern for aphasia.  On my exam, he is confused, but is able to interact and tell me his name. He is able to follow commands well.  In the ER, he hasn't been noted to have a fever of 101.5.  LKW: 8 PM tpa given?: no, not stroke, close to baseline    ROS: Unable to assess secondary to patient's altered mental status.    Past Medical History  Diagnosis Date  . Lung cancer dx'd 2006    surg only  . Hypertension   . Stroke   . Parkinson disease   . Coronary artery disease   . Gout   . Dementia   . Angina   . Shortness of breath   . TIA (transient ischemic attack)   . Renal disorder Chronic Kidney disease    Family History: Unable to assess secondary to patient's altered mental status.    Social History: Tob: Unable to assess secondary to patient's altered mental status.    Exam: Current vital signs: BP 178/81  Pulse 97  Temp(Src) 101.5 F (38.6 C) (Rectal)  Resp 21  SpO2 99% Vital signs in last 24 hours: Temp:  [101.5 F (38.6 C)] 101.5 F (38.6 C) (05/30 2222) Pulse Rate:  [95-97] 97 (05/30 2233) Resp:  [20-21] 21 (05/30 2233) BP: (165-178)/(71-86) 178/81 mmHg (05/30 2233) SpO2:  [99 %] 99 % (05/30 2233)  General: In bed, appears slightly short of breath CV: Normal rate Mental Status: Patient is awake,  alert, oriented to person only  He is able to answer simple questions and follow simple commands, he is able to repeat. Per his daughter in law he has difficulty with speech from previous strokes, that is he sometimes has trouble "getting his words together". Cranial Nerves: II: Blinks to threat bilaterally. Pupils are equal, round, and reactive to light.  Discs are difficult to visualize. III,IV, VI: EOMI without ptosis or diploplia.  V: Facial sensation is symmetric to temperature VII: Facial movement with left weakness VIII: hearing is intact to voice X: Uvula elevates symmetrically XI: Shoulder shrug is symmetric. XII: tongue is midline without atrophy or fasciculations.  Motor: Tone is increased in the left. As well as right leg. He has a spastic left hemiparesis, but is able to lift his leg out of the bed. He also has some increased tone in his right leg and is also able to lift leg out of bed, but just barely. He appears to have good strength in his right arm. Sensory: Sensation is diminished on the left Deep Tendon Reflexes: 2+ on the right and 3+ on the left biceps and patellae.  Cerebellar: Unable to assess secondary to patient's altered mental status.  Gait: Unable to assess secondary to patient's altered mental status.     I have reviewed labs in epic and the results  pertinent to this consultation are: Mildly elevated creatinine  I have reviewed the images obtained: CT head-multiple old strokes, no acute findings  Impression: 80 year old male with a history of dementia and altered mental status in the setting of fever and congestion. I suspect that his mild increasing confusion is secondary to some underlying physiological stressor such a mild infection. I think that the likelihood of this being stroke is low, but if the patient does not return to baseline with treatment of other conditions, then further workup may be needed.  Also, he has a history of seizures and is on a  low dose of Depakote.  Recommendations: 1) Depakote level 2) would only perform MRI if he continues to have problems after treatment of underlying cause of fever   Roland Rack, MD [facility] [phone]  If 7pm- 7am, please page neurology on call as listed in [place].

## 2013-05-31 NOTE — ED Notes (Signed)
Foley inserted w/o temp

## 2013-05-31 NOTE — ED Notes (Signed)
Family requested to be kept informed on pt. condition and room assignment. Son's number - [phone]

## 2013-05-31 NOTE — ED Notes (Signed)
Pt unable to urinate in urinal at this time. Urinal still in place to catch urine when pt urinates.

## 2013-05-31 NOTE — Progress Notes (Signed)
Responded to call received from charge nurse regarding the arrival of a patient presenting with stroke like symptoms. Upon arrival to ED, patient present in [facility]. Patient alert, able to verbalize his name and follow simple commands. Delay in verbal responses noted. Left arm contracted. Initial NIHHS score 11 as noted in documentation, however assessment limited due to patient's prior history of CVA as well as dementia. Dr Leonel Ramsay present in [place] and proceeded to ED room assignment for further patient assessment. Family members present in room as well. Code stroke cancelled at 2130 due to updated information received from family members. Patient sent from skilled nursing facility with complaints of fever, confusion and congestion. Patient's assigned nurse present at bedside and provided with update.

## 2013-05-31 NOTE — ED Notes (Signed)
Per facility pt was sent out due to congestion, confusion, and sob.

## 2013-06-01 DIAGNOSIS — R509 Fever, unspecified: Secondary | ICD-10-CM

## 2013-06-01 DIAGNOSIS — R29898 Other symptoms and signs involving the musculoskeletal system: Secondary | ICD-10-CM

## 2013-06-01 DIAGNOSIS — F0390 Unspecified dementia without behavioral disturbance: Secondary | ICD-10-CM

## 2013-06-01 DIAGNOSIS — R569 Unspecified convulsions: Secondary | ICD-10-CM

## 2013-06-01 DIAGNOSIS — R0602 Shortness of breath: Secondary | ICD-10-CM | POA: Diagnosis present

## 2013-06-01 DIAGNOSIS — R4182 Altered mental status, unspecified: Secondary | ICD-10-CM

## 2013-06-01 LAB — CBC
HEMATOCRIT: 31.5 % — AB (ref 39.0–52.0)
Hemoglobin: 9.8 g/dL — ABNORMAL LOW (ref 13.0–17.0)
MCH: 27.3 pg (ref 26.0–34.0)
MCHC: 31.1 g/dL (ref 30.0–36.0)
MCV: 87.7 fL (ref 78.0–100.0)
Platelets: 177 10*3/uL (ref 150–400)
RBC: 3.59 MIL/uL — ABNORMAL LOW (ref 4.22–5.81)
RDW: 16 % — AB (ref 11.5–15.5)
WBC: 6.1 10*3/uL (ref 4.0–10.5)

## 2013-06-01 LAB — BASIC METABOLIC PANEL
BUN: 27 mg/dL — AB (ref 6–23)
CHLORIDE: 105 meq/L (ref 96–112)
CO2: 27 mEq/L (ref 19–32)
CREATININE: 1.29 mg/dL (ref 0.50–1.35)
Calcium: 8.9 mg/dL (ref 8.4–10.5)
GFR calc Af Amer: 55 mL/min — ABNORMAL LOW (ref >90)
GFR, EST NON AFRICAN AMERICAN: 47 mL/min — AB (ref >90)
Glucose, Bld: 94 mg/dL (ref 70–99)
Potassium: 4.3 mEq/L (ref 3.7–5.3)
Sodium: 141 mEq/L (ref 137–147)

## 2013-06-01 LAB — MRSA PCR SCREENING: MRSA by PCR: NEGATIVE

## 2013-06-01 MED ORDER — ACETAMINOPHEN 325 MG PO TABS: 650.0000 mg | ORAL_TABLET | Freq: Four times a day (QID) | ORAL | Status: DC | PRN

## 2013-06-01 MED ORDER — LISINOPRIL 5 MG PO TABS
5.0000 mg | ORAL_TABLET | Freq: Every day | ORAL | Status: DC
Start: 2013-06-01 — End: 2013-06-05
  Administered 2013-06-01 – 2013-06-05 (×5): 5 mg via ORAL
  Filled 2013-06-01 (×6): qty 1

## 2013-06-01 MED ORDER — ACETAMINOPHEN 650 MG RE SUPP
650.0000 mg | Freq: Four times a day (QID) | RECTAL | Status: DC | PRN
  Administered 2013-06-01: 650 mg via RECTAL
  Filled 2013-06-01: qty 1

## 2013-06-01 MED ORDER — ONDANSETRON HCL 4 MG/2ML IJ SOLN: 4.0000 mg | Freq: Four times a day (QID) | INTRAMUSCULAR | Status: DC | PRN

## 2013-06-01 MED ORDER — ASPIRIN 81 MG PO CHEW
81.0000 mg | CHEWABLE_TABLET | Freq: Every day | ORAL | Status: DC
  Administered 2013-06-01 – 2013-06-03 (×3): 81 mg via ORAL
  Filled 2013-06-01 (×3): qty 1

## 2013-06-01 MED ORDER — BIOTENE DRY MOUTH MT LIQD
15.0000 mL | Freq: Two times a day (BID) | OROMUCOSAL | Status: DC
  Administered 2013-06-01 – 2013-06-05 (×9): 15 mL via OROMUCOSAL

## 2013-06-01 MED ORDER — ESCITALOPRAM OXALATE 10 MG PO TABS
10.0000 mg | ORAL_TABLET | Freq: Every morning | ORAL | Status: DC
  Administered 2013-06-01 – 2013-06-05 (×5): 10 mg via ORAL
  Filled 2013-06-01 (×5): qty 1

## 2013-06-01 MED ORDER — CARBIDOPA-LEVODOPA 25-100 MG PO TABS
0.5000 | ORAL_TABLET | Freq: Two times a day (BID) | ORAL | Status: DC
  Administered 2013-06-01 – 2013-06-05 (×8): 0.5 via ORAL
  Filled 2013-06-01 (×11): qty 0.5

## 2013-06-01 MED ORDER — HYDROCODONE-ACETAMINOPHEN 5-325 MG PO TABS: 1.0000 | ORAL_TABLET | Freq: Three times a day (TID) | ORAL | Status: DC | PRN

## 2013-06-01 MED ORDER — SODIUM CHLORIDE 0.9 % IV SOLN: INTRAVENOUS | Status: DC

## 2013-06-01 MED ORDER — DIVALPROEX SODIUM 125 MG PO CPSP
375.0000 mg | ORAL_CAPSULE | Freq: Three times a day (TID) | ORAL | Status: DC
  Administered 2013-06-01 – 2013-06-05 (×13): 375 mg via ORAL
  Filled 2013-06-01 (×16): qty 3

## 2013-06-01 MED ORDER — SODIUM CHLORIDE 0.9 % IV SOLN
INTRAVENOUS | Status: AC
  Administered 2013-06-01 (×2): via INTRAVENOUS

## 2013-06-01 MED ORDER — MECLIZINE HCL 12.5 MG PO TABS
12.5000 mg | ORAL_TABLET | Freq: Two times a day (BID) | ORAL | Status: DC | PRN
  Filled 2013-06-01: qty 1

## 2013-06-01 MED ORDER — ONDANSETRON HCL 4 MG PO TABS: 4.0000 mg | ORAL_TABLET | Freq: Four times a day (QID) | ORAL | Status: DC | PRN

## 2013-06-01 MED ORDER — CLOPIDOGREL BISULFATE 75 MG PO TABS
75.0000 mg | ORAL_TABLET | Freq: Every day | ORAL | Status: DC
  Administered 2013-06-01 – 2013-06-03 (×3): 75 mg via ORAL
  Filled 2013-06-01 (×4): qty 1

## 2013-06-01 MED ORDER — ALLOPURINOL 300 MG PO TABS
300.0000 mg | ORAL_TABLET | Freq: Every morning | ORAL | Status: DC
  Administered 2013-06-01 – 2013-06-05 (×5): 300 mg via ORAL
  Filled 2013-06-01 (×5): qty 1

## 2013-06-01 MED ORDER — HYDRALAZINE HCL 20 MG/ML IJ SOLN
5.0000 mg | Freq: Four times a day (QID) | INTRAMUSCULAR | Status: DC | PRN
  Administered 2013-06-01: 5 mg via INTRAVENOUS
  Administered 2013-06-02: 12:00:00 via INTRAVENOUS
  Administered 2013-06-03 – 2013-06-04 (×2): 5 mg via INTRAVENOUS
  Filled 2013-06-01 (×4): qty 1

## 2013-06-01 NOTE — Progress Notes (Signed)
Scheduled a.m. PO medications given with bedside swallow eval.Dilyn Osoria,RN5/31/2015 9:22 AM

## 2013-06-01 NOTE — H&P (Signed)
PCP:   Patricia Nettle, MD   Chief Complaint:  Ams, confusion  HPI: 80 yo male h/o dementia, htn, ckd, seizure disorder lives in SNF comes in with sob and fever.  More confused than usual.  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  He is at his baseline right now.  He denies any pain.  He knows he is at the hospital but does not know the year.  His family was concerned after a visit today and so wanted him to come to ED for evaluation.  Initially it was thought he was a code stroke, but this was canceled once evaluated by neurology team.  History obtained from ED staff.    Review of Systems:  Unobtainable from patient, family not present  Past Medical History: Past Medical History  Diagnosis Date  . Lung cancer dx'd 2006    surg only  . Hypertension   . Stroke   . Parkinson disease   . Coronary artery disease   . Gout   . Dementia   . Angina   . Shortness of breath   . TIA (transient ischemic attack)   . Renal disorder Chronic Kidney disease   Past Surgical History  Procedure Laterality Date  . Lasik    . Coronary angioplasty with stent placement    . Lobectomy      Medications: Prior to Admission medications   Medication Sig Start Date End Date Taking? Authorizing Provider  allopurinol (ZYLOPRIM) 300 MG tablet Take 300 mg by mouth every morning.    Yes Historical Provider, MD  aspirin 81 MG chewable tablet Chew 81 mg by mouth daily.   Yes Historical Provider, MD  carbidopa-levodopa (SINEMET IR) 25-100 MG per tablet Take 0.5 tablets by mouth 2 (two) times daily.    Yes Historical Provider, MD  clopidogrel (PLAVIX) 75 MG tablet Take 75 mg by mouth every morning.    Yes Historical Provider, MD  escitalopram (LEXAPRO) 10 MG tablet Take 10 mg by mouth every morning.    Yes Historical Provider, MD  furosemide (LASIX) 20 MG tablet Take 10 mg by mouth daily.   Yes Historical Provider, MD  HYDROcodone-acetaminophen (NORCO/VICODIN) 5-325 MG per tablet Take 1 tablet by  mouth every 8 (eight) hours as needed for moderate pain.   Yes Historical Provider, MD  lisinopril (PRINIVIL,ZESTRIL) 5 MG tablet Take 5 mg by mouth daily.   Yes Historical Provider, MD  meclizine (ANTIVERT) 25 MG tablet Take 12.5 mg by mouth 2 (two) times daily as needed for dizziness.    Yes Historical Provider, MD    Allergies:  No Known Allergies  Social History:  reports that he has quit smoking. He has never used smokeless tobacco. He reports that he does not drink alcohol or use illicit drugs.  Family History: History reviewed. No pertinent family history.  Physical Exam: Filed Vitals:   05/31/13 2233 05/31/13 2245 05/31/13 2315 05/31/13 2345  BP: 178/81 121/76 161/64 148/61  Pulse: 97 92 85 81  Temp:      TempSrc:      Resp: 21 19 21 18   SpO2: 99% 99% 100% 100%   General appearance: alert, cooperative and no distress Head: Normocephalic, without obvious abnormality, atraumatic Eyes: negative Nose: Nares normal. Septum midline. Mucosa normal. No drainage or sinus tenderness. Neck: no JVD and supple, symmetrical, trachea midline Lungs: clear to auscultation bilaterally Heart: regular rate and rhythm, S1, S2 normal, no murmur, click, rub or gallop Abdomen: soft, non-tender; bowel sounds  normal; no masses,  no organomegaly Extremities: extremities normal, atraumatic, no cyanosis or edema Pulses: 2+ and symmetric Skin: Skin color, texture, turgor normal. No rashes or lesions Neurologic: Grossly normal x lue contracted    Labs on Admission:   Recent Labs  05/31/13 2102  NA 139  K 4.3  CL 100  CO2 28  GLUCOSE 111*  BUN 33*  CREATININE 1.45*  CALCIUM 9.6    Recent Labs  05/31/13 2102  AST 17  ALT 9  ALKPHOS 63  BILITOT 0.3  PROT 8.1  ALBUMIN 3.3*    Recent Labs  05/31/13 2102  WBC 5.3  NEUTROABS 3.8  HGB 10.8*  HCT 34.8*  MCV 88.8  PLT 201   Radiological Exams on Admission: Ct Head (brain) Wo Contrast  05/31/2013   CLINICAL DATA:   80 year old male code stroke. aphasia, found with altered mental status. Initial encounter.  EXAM: CT HEAD WITHOUT CONTRAST  TECHNIQUE: Contiguous axial images were obtained from the base of the skull through the vertex without intravenous contrast.  COMPARISON:  04/29/2013 and earlier.  FINDINGS: Stable parotid space calcifications. Stable scalp and orbits soft tissues. Mild mucosal thickening in the right maxillary sinus is new along with a small fluid level. Other Visualized paranasal sinuses and mastoids are clear. No acute osseous abnormality identified.  Calcified atherosclerosis at the skull base. Stable cerebral volume. Mild ventriculomegaly and chronic bilateral MCA cortical encephalomalacia. Widespread hypodensity in the thalami I compatible with advanced chronic small vessel ischemia. Less pronounced but similar are hypodensity in the pons. No suspicious intracranial vascular hyperdensity. No evidence of cortically based acute infarction identified. No acute intracranial hemorrhage identified. No midline shift, mass effect, or evidence of intracranial mass lesion.  IMPRESSION: Chronic ischemic disease appears stable. No acute intracranial abnormality.   Electronically Signed   By: Lars Pinks M.D.   On: 05/31/2013 21:16   Dg Chest Port 1 View  05/31/2013   CLINICAL DATA:  Weakness, fever  EXAM: PORTABLE CHEST - 1 VIEW  COMPARISON:  Chest radiograph 04/29/2013  FINDINGS: Stable enlarged cardiac silhouette. There is chronic atelectasis in the right middle lobe. Some volume loss in the left hemi thorax. Normal pulmonary vasculature.  IMPRESSION: No change from prior.  No acute findings.   Electronically Signed   By: Suzy Bouchard M.D.   On: 05/31/2013 21:46    Assessment/Plan  80 yo male with delerium/ams from fever of unclear etiology  Principal Problem:   Altered mental status-  Fever, cxr and ua are unrevealing.  Cover broadly with iv vanc and zosyn.  Possibly d/c tom back to SNF as this could  be managed by the skilled nursing facility.  Active Problems:  Stable unless o/w noted   Parkinson disease   Dementia   Fever   SOB (shortness of breath)  Full code.  Rachal A Shanon Brow 06/01/2013, 12:18 AM

## 2013-06-01 NOTE — Progress Notes (Addendum)
On call physician paged to physician not returning page. Physician made aware of pt's BP, possible NPO for failing stroke swallow screen, and possible need for speech eval. Physician state monitor BP and call back if increase.

## 2013-06-01 NOTE — Progress Notes (Signed)
Subjective: No recurrence of seizure activity reported. No adverse clinical events otherwise, reported  Objective: Current vital signs: BP 151/70  Pulse 89  Temp(Src) 99.9 F (37.7 C) (Oral)  Resp 18  Ht 5\' 8"  (1.727 m)  Wt 70.308 kg (155 lb)  BMI 23.57 kg/m2  SpO2 93%  Neurologic Exam: Alert and confused. Verbal responses to questions were incoherent, for the most part. Extraocular movements were full and conjugate. Patient was able to move his right upper extremity and both lower extremities well. He appear to have flexion contractures of his left upper extremity at the elbow and to a lesser extent at the wrist, with markedly reduced range of motion. Muscle tone was increased throughout, markedly so and left upper extremity with moderate otherwise. Deep tendon reflexes were trace to 1+ in upper extremities and absent lower extremities. He had moderately active frontal release signs.  Medications: I have reviewed the patient's current medications.  Assessment/Plan: 80 year old male with severe dementia, Parkinson's disease and stroke who presented with focal motor seizures in addition to increased confusion and fever. Depakote has been increased from 125 mg 3 times a day to 375 mg 3 times a day. Patient is apparently back to his baseline mental status and functioning otherwise.  Recommend no changes in current management. No further neurodiagnostic studies are indicated at this point. I will plan to see him on an as-needed basis following this visit.  C.R. Nicole Kindred, MD [facility] [phone]  06/01/2013  12:23 PM

## 2013-06-01 NOTE — Evaluation (Signed)
Clinical/Bedside Swallow Evaluation Patient Details  Name: Christopher Cook MRN: [id number] Date of Birth: Sep 23, 1921  Today's Date: 06/01/2013 Time: 0900-0930 SLP Time Calculation (min): 30 min  Past Medical History:  Past Medical History  Diagnosis Date  . Lung cancer dx'd 2006    surg only  . Hypertension   . Stroke   . Parkinson disease   . Coronary artery disease   . Gout   . Dementia   . Angina   . Shortness of breath   . TIA (transient ischemic attack)   . Renal disorder Chronic Kidney disease   Past Surgical History:  Past Surgical History  Procedure Laterality Date  . Lasik    . Coronary angioplasty with stent placement    . Lobectomy     HPI:  80 yo male h/o dementia, htn, ckd, Parkinsons disease, TIA, seizure disorder lives in SNF comes in with sob and fever of unknown etiology.cxr and ua are unrevealing. More confused than usual. 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    Assessment / Plan / Recommendation Clinical Impression  Patient presents with overt indication of aspiration with all consistencies thinner than pureed solids/pudding thick, despite max SLP cueing for controlled bolus sizes, cues for throat clearing, cough. Per son, patient on a pureed diet with thin liquids prior to admission with no  notable difficulties. Unclear if this is patients baseline function given h/o dementia, Parkinson's with aspiration possibly accounting for development of fevers or if swallowing function has declined as a result of acute illness. Discussed with MD. Recommend continued NPO except meds crushed in puree with f/u at bedside 6/1 for diagnostic treatment to determine potential to advance diet vs need for instrumental study following 24 hours of ABX treatment. RN made aware.     Aspiration Risk  Severe    Diet Recommendation NPO;NPO except meds   Medication Administration: Crushed with puree    Other  Recommendations Oral Care Recommendations:  (QID, ? oral  thrush, RN aware)   Follow Up Recommendations  Skilled Nursing facility    Frequency and Duration min 3x week  2 weeks   Pertinent Vitals/Pain n/a        Swallow Study    General HPI: 80 yo male h/o dementia, htn, ckd, Parkinsons disease, TIA, seizure disorder lives in SNF comes in with sob and fever of unknown etiology.cxr and ua are unrevealing. More confused than usual. 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  Type of Study: Bedside swallow evaluation Previous Swallow Assessment: MBS noted 05/26/2005-no report found Diet Prior to this Study: NPO Temperature Spikes Noted: No Respiratory Status: Nasal cannula History of Recent Intubation: No Behavior/Cognition: Alert;Cooperative;Pleasant mood;Confused;Requires cueing Oral Cavity - Dentition: Edentulous Self-Feeding Abilities: Needs assist Patient Positioning: Upright in bed Baseline Vocal Quality: Low vocal intensity (pitch breaks) Volitional Cough: Weak Volitional Swallow: Unable to elicit    Oral/Motor/Sensory Function Overall Oral Motor/Sensory Function: Impaired (generalized weakness)   Ice Chips Ice chips: Impaired Presentation: Spoon Pharyngeal Phase Impairments: Decreased hyoid-laryngeal movement (multiple swallows)   Thin Liquid Thin Liquid: Impaired Presentation: Cup;Self Fed;Straw Pharyngeal  Phase Impairments: Suspected delayed Swallow;Decreased hyoid-laryngeal movement;Multiple swallows;Wet Vocal Quality;Throat Clearing - Delayed    Nectar Thick Nectar Thick Liquid: Impaired Presentation: Cup;Self Fed Pharyngeal Phase Impairments: Suspected delayed Swallow;Multiple swallows;Wet Vocal Quality;Throat Clearing - Delayed   Honey Thick Honey Thick Liquid: Not tested   Puree Puree: Within functional limits Presentation: Spoon   Solid   GO Functional Assessment Tool Used: skilled  clinical judgement Functional Limitations: Swallowing Swallow Current Status [phone]): At least 60 percent but less than 80  percent impaired, limited or restricted Swallow Goal Status [phone]): At least 40 percent but less than 60 percent impaired, limited or restricted  Solid: Not tested (per son, baseline diet is pureed solids, thin liquids )      Christopher Jost MA, CCC-SLP [phone]  Christopher Cook Christopher Cook 06/01/2013,9:38 AM

## 2013-06-01 NOTE — Progress Notes (Signed)
Pt arrive to unit in no s/s of distress. Pt oriented to self. Pt introduced to room and unit. Fissure on butt crack - cleansed and foam applied. Calloused heels. NS infusing at 75. Whiteboard updated. VS stable. BP elevated - MD made aware. Report received from ED nurse prior to pt's arrival. Callbell within reach. Will continue to monitor. Bed alarm set.

## 2013-06-01 NOTE — Progress Notes (Signed)
Pt alert and oriented to self. Poor historian. Unable to complete admission documentation at this time.

## 2013-06-01 NOTE — Progress Notes (Signed)
On call physician paged for pt's BP - prn med ordered. Med not stocked in pyxis - pharmacy notified. Dayshift nurse will admin med when arrive to floor.

## 2013-06-01 NOTE — Plan of Care (Signed)
Problem: SLP Dysphagia Goals Goal: Patient will demonstrate readiness for PO's Patient will demonstrate readiness for PO's and/or instrumental swallow study as evidenced by: As evidenced by ability to consume liquids trials without overt indication of aspiration with max cueing/assist

## 2013-06-01 NOTE — Progress Notes (Signed)
Physician paged for NPO orders due to pt failing the stroke swallow screen in the ED.

## 2013-06-01 NOTE — Progress Notes (Signed)
Patient seen and examined this morning, agree with H&P. - continue broad spectrum Abx, no clear source of infection as CXR clear, UA without leukocytes/nitrite  - discussed with SLP today and she expressed concerns for ongoing aspiration - repeat evaluation tomorrow. ?aspiration pneumonitis. If cultures negative by tomorrow and CXR without new infiltrate consider d/c antibiotics.  - keep NPO except crushed pills, IVF - repeat CXR after hydration, ordered for 6/1 am. - based on progress/repeat imaging SLP evaluation ?palliative care discussions.  Costin M. Cruzita Lederer, MD [facility] [phone]

## 2013-06-01 NOTE — Progress Notes (Addendum)
Pt's urine blood tinged with small clots in catheter bag. MD notified.   Md verbal to keep catheter in place and monitor for increased bleeding or clots.

## 2013-06-02 ENCOUNTER — Inpatient Hospital Stay (HOSPITAL_COMMUNITY): Payer: Medicare Other

## 2013-06-02 DIAGNOSIS — N179 Acute kidney failure, unspecified: Secondary | ICD-10-CM

## 2013-06-02 DIAGNOSIS — J690 Pneumonitis due to inhalation of food and vomit: Principal | ICD-10-CM

## 2013-06-02 LAB — BASIC METABOLIC PANEL
BUN: 22 mg/dL (ref 6–23)
CHLORIDE: 104 meq/L (ref 96–112)
CO2: 25 mEq/L (ref 19–32)
CREATININE: 1.44 mg/dL — AB (ref 0.50–1.35)
Calcium: 8.8 mg/dL (ref 8.4–10.5)
GFR, EST AFRICAN AMERICAN: 48 mL/min — AB (ref >90)
GFR, EST NON AFRICAN AMERICAN: 42 mL/min — AB (ref >90)
Glucose, Bld: 77 mg/dL (ref 70–99)
Potassium: 4.1 mEq/L (ref 3.7–5.3)
Sodium: 140 mEq/L (ref 137–147)

## 2013-06-02 LAB — URINE CULTURE
Colony Count: NO GROWTH
Culture: NO GROWTH

## 2013-06-02 LAB — CBC
HCT: 28.5 % — ABNORMAL LOW (ref 39.0–52.0)
Hemoglobin: 8.7 g/dL — ABNORMAL LOW (ref 13.0–17.0)
MCH: 27.6 pg (ref 26.0–34.0)
MCHC: 30.5 g/dL (ref 30.0–36.0)
MCV: 90.5 fL (ref 78.0–100.0)
Platelets: 169 10*3/uL (ref 150–400)
RBC: 3.15 MIL/uL — ABNORMAL LOW (ref 4.22–5.81)
RDW: 16.7 % — AB (ref 11.5–15.5)
WBC: 6.1 10*3/uL (ref 4.0–10.5)

## 2013-06-02 MED ORDER — RESOURCE THICKENUP CLEAR PO POWD
ORAL | Status: DC | PRN
  Filled 2013-06-02: qty 125

## 2013-06-02 MED ORDER — PNEUMOCOCCAL VAC POLYVALENT 25 MCG/0.5ML IJ INJ
0.5000 mL | INJECTION | INTRAMUSCULAR | Status: DC
  Filled 2013-06-02: qty 0.5

## 2013-06-02 MED ORDER — HEPARIN SODIUM (PORCINE) 5000 UNIT/ML IJ SOLN
5000.0000 [IU] | Freq: Three times a day (TID) | INTRAMUSCULAR | Status: DC
  Administered 2013-06-02 – 2013-06-03 (×3): 5000 [IU] via SUBCUTANEOUS
  Filled 2013-06-02 (×6): qty 1

## 2013-06-02 MED ORDER — SODIUM CHLORIDE 0.9 % IV SOLN
INTRAVENOUS | Status: DC
  Administered 2013-06-02: 75 mL/h via INTRAVENOUS
  Administered 2013-06-02: 23:00:00 via INTRAVENOUS

## 2013-06-02 NOTE — Progress Notes (Signed)
Thank you for consulting the Palliative Medicine Team at [facility] to meet your patient's and family's needs.   The reason that you asked [place] to see your patient is  For Clarification of GOC and options  We have scheduled your patient for a meeting: 06-03-13 at 9983 am with Vinie Sill NP  The Surrogate decision make is:  Son/Winford Deines and Grand-daughter/Cherisse Ricketson   Other family members that need to be present:   Cherisse to gather multiple family members  Your patient is able/unable to participate: unable  Wadie Lessen NP  Palliative Medicine Team Team Phone # [phone] Pager [phone]

## 2013-06-02 NOTE — Procedures (Signed)
Objective Swallowing Evaluation: Modified Barium Swallowing Study  Patient Details  Name: Christopher Cook MRN: [id number] Date of Birth: 1921-01-24  Today's Date: 06/02/2013 Time: 1330-1400 SLP Time Calculation (min): 30 min  Past Medical History:  Past Medical History  Diagnosis Date  . Lung cancer dx'd 2006    surg only  . Hypertension   . Stroke   . Parkinson disease   . Coronary artery disease   . Gout   . Dementia   . Angina   . Shortness of breath   . TIA (transient ischemic attack)   . Renal disorder Chronic Kidney disease   Past Surgical History:  Past Surgical History  Procedure Laterality Date  . Lasik    . Coronary angioplasty with stent placement    . Lobectomy     HPI:  80 yo male h/o dementia, htn, ckd, Parkinsons disease, TIA, seizure disorder lives in SNF comes in with sob and fever of unknown etiology.cxr and ua are unrevealing. More confused than usual. 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      Assessment / Plan / Recommendation Clinical Impression  Dysphagia Diagnosis: Moderate oral phase dysphagia;Moderate pharyngeal phase dysphagia Clinical impression: Pt presents with a moderate oral dysphagia with reduced awareness of boluses; requries visual and tactile cues to appropriately accept PO. There is anterior spillage with teaspoon and cup sip trials as well as slow, pulping tranist of bolus to pharynx with significant premature spillage. Swallow is delayed and liquids are silently penetrated and aspirated before/during the swallow depending on bolus size. Base of tongue is weak and epiglottic deflection is reduced due to structural barrier (cervical hardware narrowing pharyngeal space), causing incopmlete airway closure. Pt could not fully tuck chin (? reduced ROM due to ACDF).  Pt was able to tolerate 1/2 teaspoon boluses of nectar with a cues throat clear and second swallow to clear vallecular residuals. Pts Granddaughter present for education via  monitor and written instruciton.     Treatment Recommendation  Therapy as outlined in treatment plan below    Diet Recommendation Dysphagia 1 (Puree);Nectar-thick liquid   Liquid Administration via: Spoon Medication Administration: Crushed with puree Supervision: Staff to assist with self feeding;Full supervision/cueing for compensatory strategies Compensations: Slow rate;Small sips/bites;Multiple dry swallows after each bite/sip;Clear throat after each swallow Postural Changes and/or Swallow Maneuvers: Seated upright 90 degrees;Upright 30-60 min after meal    Other  Recommendations Oral Care Recommendations: Oral care BID Other Recommendations: Order thickener from pharmacy   Follow Up Recommendations  Skilled Nursing facility    Frequency and Duration min 2x/week  2 weeks   Pertinent Vitals/Pain NA    SLP Swallow Goals     General HPI: 80 yo male h/o dementia, htn, ckd, Parkinsons disease, TIA, seizure disorder lives in SNF comes in with sob and fever of unknown etiology.cxr and ua are unrevealing. More confused than usual. 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  Type of Study: Modified Barium Swallowing Study Reason for Referral: Objectively evaluate swallowing function Previous Swallow Assessment: MBS noted 05/26/2005-no report found Diet Prior to this Study: NPO Temperature Spikes Noted: No Respiratory Status: Nasal cannula History of Recent Intubation: No Behavior/Cognition: Alert;Cooperative;Pleasant mood;Confused;Requires cueing Oral Cavity - Dentition: Edentulous Oral Motor / Sensory Function: Impaired - see Bedside swallow eval (generalized weakness) Self-Feeding Abilities: Needs assist Patient Positioning: Upright in chair Baseline Vocal Quality: Low vocal intensity Volitional Cough: Weak Volitional Swallow: Able to elicit Anatomy:  (cervical fusion hardware at C3/4?) Pharyngeal Secretions:  Not observed secondary MBS    Reason for Referral  Objectively evaluate swallowing function   Oral Phase Oral Preparation/Oral Phase Oral Phase: Impaired Oral - Nectar Oral - Nectar Teaspoon: Delayed oral transit;Right anterior bolus loss;Weak lingual manipulation;Reduced posterior propulsion Oral - Nectar Cup: Delayed oral transit;Right anterior bolus loss;Weak lingual manipulation;Reduced posterior propulsion Oral - Nectar Straw: Delayed oral transit;Right anterior bolus loss;Weak lingual manipulation;Reduced posterior propulsion Oral - Thin Oral - Thin Teaspoon: Delayed oral transit;Right anterior bolus loss;Weak lingual manipulation;Reduced posterior propulsion Oral - Thin Cup: Delayed oral transit;Right anterior bolus loss;Weak lingual manipulation;Reduced posterior propulsion Oral - Thin Straw: Delayed oral transit;Right anterior bolus loss;Weak lingual manipulation;Reduced posterior propulsion Oral - Solids Oral - Puree: Delayed oral transit   Pharyngeal Phase Pharyngeal Phase Pharyngeal Phase: Impaired Pharyngeal - Nectar Pharyngeal - Nectar Teaspoon: Premature spillage to pyriform sinuses;Delayed swallow initiation;Reduced epiglottic inversion;Reduced airway/laryngeal closure;Reduced tongue base retraction;Penetration/Aspiration during swallow;Penetration/Aspiration before swallow;Pharyngeal residue - valleculae Penetration/Aspiration details (nectar teaspoon): Material enters airway, remains ABOVE vocal cords and not ejected out;Material does not enter airway Pharyngeal - Nectar Cup: Premature spillage to pyriform sinuses;Delayed swallow initiation;Reduced epiglottic inversion;Reduced airway/laryngeal closure;Reduced tongue base retraction;Penetration/Aspiration during swallow;Penetration/Aspiration before swallow;Pharyngeal residue - valleculae;Trace aspiration Penetration/Aspiration details (nectar cup): Material enters airway, CONTACTS cords and not ejected out Pharyngeal - Nectar Straw: Premature spillage to pyriform  sinuses;Delayed swallow initiation;Reduced epiglottic inversion;Reduced airway/laryngeal closure;Reduced tongue base retraction;Penetration/Aspiration during swallow;Penetration/Aspiration before swallow;Pharyngeal residue - valleculae;Trace aspiration Penetration/Aspiration details (nectar straw): Material enters airway, CONTACTS cords and not ejected out Pharyngeal - Thin Pharyngeal - Thin Teaspoon: Premature spillage to pyriform sinuses;Delayed swallow initiation;Reduced epiglottic inversion;Reduced airway/laryngeal closure;Reduced tongue base retraction;Penetration/Aspiration during swallow;Penetration/Aspiration before swallow;Pharyngeal residue - valleculae;Trace aspiration Penetration/Aspiration details (thin teaspoon): Material enters airway, CONTACTS cords and not ejected out Pharyngeal - Thin Cup: Premature spillage to pyriform sinuses;Delayed swallow initiation;Reduced epiglottic inversion;Reduced airway/laryngeal closure;Reduced tongue base retraction;Penetration/Aspiration during swallow;Penetration/Aspiration before swallow;Pharyngeal residue - valleculae;Trace aspiration Penetration/Aspiration details (thin cup): Material enters airway, passes BELOW cords without attempt by patient to eject out (silent aspiration) Pharyngeal - Thin Straw: Premature spillage to pyriform sinuses;Delayed swallow initiation;Reduced epiglottic inversion;Reduced airway/laryngeal closure;Reduced tongue base retraction;Penetration/Aspiration during swallow;Penetration/Aspiration before swallow;Pharyngeal residue - valleculae;Trace aspiration Penetration/Aspiration details (thin straw): Material enters airway, passes BELOW cords without attempt by patient to eject out (silent aspiration) Pharyngeal - Solids Pharyngeal - Puree: Delayed swallow initiation;Reduced epiglottic inversion;Reduced tongue base retraction;Pharyngeal residue - valleculae  Cervical Esophageal Phase    GO    Cervical Esophageal  Phase Cervical Esophageal Phase: Impaired Cervical Esophageal Phase - Comment Cervical Esophageal Comment: appearance of mild hesitation of PO at mid esophageal column        Herbie Baltimore, MA CCC-SLP [phone]  Christopher Cook 06/02/2013, 2:56 PM

## 2013-06-02 NOTE — Progress Notes (Signed)
Speech Language Pathology Treatment: Dysphagia  Patient Details Name: Christopher Cook MRN: [id number] DOB: 10-Oct-1921 Today's Date: 06/02/2013 Time: 6579-0383 SLP Time Calculation (min): 13 min  Assessment / Plan / Recommendation Clinical Impression  Pt demonstrates persistent evidence of dysphagia with thin liquids and possibly even puree. There are delayed, weak multiple swallows, immediate slight throat clearing and delayed strong cough, indicative of partially silent aspiration. Recommend MBS to determine safety with PO. Will complete today.    HPI HPI: 80 yo male h/o dementia, htn, ckd, Parkinsons disease, TIA, seizure disorder lives in SNF comes in with sob and fever of unknown etiology.cxr and ua are unrevealing. More confused than usual. 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    Pertinent Vitals NA  SLP Plan  MBS    Recommendations Diet recommendations: NPO (except meds) Medication Administration: Crushed with puree              Oral Care Recommendations: Oral care Q4 per protocol Follow up Recommendations: Skilled Nursing facility Plan: Christopher Cook, [place] CCC-SLP Christopher Cook 06/02/2013, 9:21 AM

## 2013-06-02 NOTE — Progress Notes (Signed)
Patient seen and examined. Agree with assessment and plan outlined.  Mental status appears to be improving as patient is more oriented.  MBS shows moderate oral phase dysphagia. Recommended dys level 1 diet ( patient was started on this diet at ALF since 2 weeks). Family want no CODE BLUE and agree with meeting palliative care for further Gordon. Meeting scheduled for am. Continue antibiotics for now.

## 2013-06-02 NOTE — Progress Notes (Signed)
PROGRESS NOTE  Christopher Cook CHE:[id number] DOB: 30-Sep-1921 DOA: 05/31/2013 PCP: Patricia Nettle, MD  Assessment/Plan:  Aspiration / HCAP Fever of 101.4 overnight. Patient found to be aspirating on Speech Eval.   MBS ordered. On vanc / Zosyn IV.  After 3 days of IV antibiotics will de-escalate. Blood culture shows no growth to date. Dr. Clementeen Graham had an indepth conversation with multiple family members who agree to DNR status and palliative medicine consult.  Focal Motor Seizures Neurology consulted.   Depakote increased. No further seizure activity. CT Head negative for acute abnormality.  Altered mental status With baseline severe dementia.   Family at bedside.  Normocytic Anemia Hgb declining.  No frank bleeding. Anemia of Chronic disease?   Dementia Severe.  History of Stroke Continue Plavix.   HTN Hydralazine IV while NPO for MBS. Will restart home BP meds when able.   Parkinsons Continue Sinemet when able to take POs.      DVT Prophylaxis:  Heparin Code Status: DNR Family Communication: Family meeting. Disposition Plan: To SNF when able.     Consultants:  Palliative     Antibiotics: Anti-infectives   Start     Dose/Rate Route Frequency Ordered Stop   06/01/13 2330  vancomycin (VANCOCIN) 500 mg in sodium chloride 0.9 % 100 mL IVPB     500 mg 100 mL/hr over 60 Minutes Intravenous Every 24 hours 05/31/13 2253     06/01/13 0600  piperacillin-tazobactam (ZOSYN) IVPB 3.375 g     3.375 g 12.5 mL/hr over 240 Minutes Intravenous 3 times per day 05/31/13 2253     05/31/13 2230  piperacillin-tazobactam (ZOSYN) IVPB 3.375 g     3.375 g 100 mL/hr over 30 Minutes Intravenous  Once 05/31/13 2227 05/31/13 2346   05/31/13 2230  vancomycin (VANCOCIN) IVPB 1000 mg/200 mL premix     1,000 mg 200 mL/hr over 60 Minutes Intravenous  Once 05/31/13 2227 06/01/13 0012        HPI/Subjective: Denies pain.  Speech unintelligible.  Objective: Filed  Vitals:   06/02/13 0038 06/02/13 0501 06/02/13 1104 06/02/13 1138  BP:  130/69 176/77 176/77  Pulse:  70    Temp: 99.8 F (37.7 C) 98.5 F (36.9 C)    TempSrc: Oral Oral    Resp:  18    Height:      Weight:      SpO2:  96%      Intake/Output Summary (Last 24 hours) at 06/02/13 1248 Last data filed at 06/02/13 0350  Gross per 24 hour  Intake   3.38 ml  Output   1100 ml  Net -1096.62 ml   Filed Weights   06/01/13 0146  Weight: 70.308 kg (155 lb)    Exam: General: Well developed, well nourished, NAD, severely demented. Does not make eye contact. HEENT:  Anicteic Sclera, MMM. No pharyngeal erythema or exudates  Neck: Supple, no JVD, no masses  Cardiovascular: RRR, S1 S2 auscultated, no rubs, murmurs or gallops.   Respiratory: Clear to auscultation bilaterally with equal chest rise.  Decreased breath sounds on the right. Abdomen: Soft, nontender, nondistended, + bowel sounds  Extremities: warm dry without cyanosis clubbing or edema.  Neuro: AAOx3, cranial nerves grossly intact. Strength 5/5 in upper and lower extremities  Skin: Without rashes exudates or nodules.          Data Reviewed: Basic Metabolic Panel:  Recent Labs Lab 05/31/13 2102 06/01/13 0710 06/02/13 0736  NA 139 141 140  K 4.3 4.3 4.1  CL 100 105 104  CO2 28 27 25   GLUCOSE 111* 94 77  BUN 33* 27* 22  CREATININE 1.45* 1.29 1.44*  CALCIUM 9.6 8.9 8.8   Liver Function Tests:  Recent Labs Lab 05/31/13 2102  AST 17  ALT 9  ALKPHOS 63  BILITOT 0.3  PROT 8.1  ALBUMIN 3.3*   CBC:  Recent Labs Lab 05/31/13 2102 06/01/13 0710 06/02/13 0736  WBC 5.3 6.1 6.1  NEUTROABS 3.8  --   --   HGB 10.8* 9.8* 8.7*  HCT 34.8* 31.5* 28.5*  MCV 88.8 87.7 90.5  PLT 201 177 169   BNP (last 3 results)  Recent Labs  05/31/13 2124  PROBNP 1473.0*   CBG:  Recent Labs Lab 05/31/13 2115  GLUCAP 97    Recent Results (from the past 240 hour(s))  CULTURE, BLOOD (ROUTINE X 2)     Status: None    Collection Time    05/31/13  9:45 PM      Result Value Ref Range Status   Specimen Description BLOOD RIGHT ANTECUBITAL   Final   Special Requests BOTTLES DRAWN AEROBIC AND ANAEROBIC 5CC EA   Final   Culture  Setup Time     Final   Value: 06/01/2013 03:42     Performed at [facility]   Culture     Final   Value:        BLOOD CULTURE RECEIVED NO GROWTH TO DATE CULTURE WILL BE HELD FOR 5 DAYS BEFORE ISSUING A FINAL NEGATIVE REPORT     Performed at [facility]   Report Status PENDING   Incomplete  URINE CULTURE     Status: None   Collection Time    05/31/13 10:33 PM      Result Value Ref Range Status   Specimen Description URINE, CLEAN CATCH   Final   Special Requests NONE   Final   Culture  Setup Time     Final   Value: 05/31/2013 23:16     Performed at [facility]     Final   Value: NO GROWTH     Performed at [facility]   Culture     Final   Value: NO GROWTH     Performed at [facility]   Report Status 06/02/2013 FINAL   Final  CULTURE, BLOOD (ROUTINE X 2)     Status: None   Collection Time    05/31/13 10:38 PM      Result Value Ref Range Status   Specimen Description BLOOD RIGHT WRIST   Final   Special Requests BOTTLES DRAWN AEROBIC ONLY 1CC   Final   Culture  Setup Time     Final   Value: 06/01/2013 03:42     Performed at [facility]   Culture     Final   Value:        BLOOD CULTURE RECEIVED NO GROWTH TO DATE CULTURE WILL BE HELD FOR 5 DAYS BEFORE ISSUING A FINAL NEGATIVE REPORT     Performed at [facility]   Report Status PENDING   Incomplete  MRSA PCR SCREENING     Status: None   Collection Time    06/01/13  1:44 AM      Result Value Ref Range Status   MRSA by PCR NEGATIVE  NEGATIVE Final   Comment:            The GeneXpert MRSA Assay (FDA  approved for NASAL specimens     only), is one component of a     comprehensive MRSA colonization     surveillance program. It is not      intended to diagnose MRSA     infection nor to guide or     monitor treatment for     MRSA infections.     Studies: Ct Head (brain) Wo Contrast  05/31/2013   CLINICAL DATA:  80 year old male code stroke. aphasia, found with altered mental status. Initial encounter.  EXAM: CT HEAD WITHOUT CONTRAST  TECHNIQUE: Contiguous axial images were obtained from the base of the skull through the vertex without intravenous contrast.  COMPARISON:  04/29/2013 and earlier.  FINDINGS: Stable parotid space calcifications. Stable scalp and orbits soft tissues. Mild mucosal thickening in the right maxillary sinus is new along with a small fluid level. Other Visualized paranasal sinuses and mastoids are clear. No acute osseous abnormality identified.  Calcified atherosclerosis at the skull base. Stable cerebral volume. Mild ventriculomegaly and chronic bilateral MCA cortical encephalomalacia. Widespread hypodensity in the thalami I compatible with advanced chronic small vessel ischemia. Less pronounced but similar are hypodensity in the pons. No suspicious intracranial vascular hyperdensity. No evidence of cortically based acute infarction identified. No acute intracranial hemorrhage identified. No midline shift, mass effect, or evidence of intracranial mass lesion.  IMPRESSION: Chronic ischemic disease appears stable. No acute intracranial abnormality.   Electronically Signed   By: Lars Pinks M.D.   On: 05/31/2013 21:16   Dg Chest Port 1 View  06/02/2013   CLINICAL DATA:  Shortness of breath, weakness.  EXAM: PORTABLE CHEST - 1 VIEW  COMPARISON:  05/31/2013  FINDINGS: Low lung volumes. Increasing left lower lobe atelectasis or infiltrate. Postoperative changes on the left. Right mid lung predominately platelike density, likely atelectasis or scarring. Mild cardiomegaly. No acute bony abnormality.  IMPRESSION: Low lung volumes with increasing left lower lobe atelectasis or infiltrate.   Electronically Signed   By: Rolm Baptise  M.D.   On: 06/02/2013 08:10   Dg Chest Port 1 View  05/31/2013   CLINICAL DATA:  Weakness, fever  EXAM: PORTABLE CHEST - 1 VIEW  COMPARISON:  Chest radiograph 04/29/2013  FINDINGS: Stable enlarged cardiac silhouette. There is chronic atelectasis in the right middle lobe. Some volume loss in the left hemi thorax. Normal pulmonary vasculature.  IMPRESSION: No change from prior.  No acute findings.   Electronically Signed   By: Suzy Bouchard M.D.   On: 05/31/2013 21:46    Scheduled Meds: . allopurinol  300 mg Oral q morning - 10a  . antiseptic oral rinse  15 mL Mouth Rinse BID  . aspirin  81 mg Oral Daily  . carbidopa-levodopa  0.5 tablet Oral BID  . clopidogrel  75 mg Oral Q breakfast  . divalproex  375 mg Oral 3 times per day  . escitalopram  10 mg Oral q morning - 10a  . heparin subcutaneous  5,000 Units Subcutaneous 3 times per day  . lisinopril  5 mg Oral Daily  . piperacillin-tazobactam (ZOSYN)  IV  3.375 g Intravenous 3 times per day  . [START ON 06/03/2013] pneumococcal 23 valent vaccine  0.5 mL Intramuscular Tomorrow-1000  . vancomycin  500 mg Intravenous Q24H   Continuous Infusions:   Principal Problem:   Altered mental status Active Problems:   Parkinson disease   Dementia   Fever   SOB (shortness of breath)   Aspiration pneumonia    Bobby Rumpf  [place], PA-C [facility] Pager [phone]. If 7PM-7AM, please contact night-coverage at [email or web address], password [facility] 06/02/2013, 12:48 PM  LOS: 2 days

## 2013-06-02 NOTE — Progress Notes (Signed)
INITIAL NUTRITION ASSESSMENT  DOCUMENTATION CODES Per approved criteria  -Not Applicable   INTERVENTION: - Diet advancement per SLP - Recommend Ensure Complete daily once diet advanced to appropriate consistency - RD to continue to monitor   NUTRITION DIAGNOSIS: Inadequate oral intake related to inability to eat as evidenced by NPO.   Goal: Advance diet as tolerated to diet per SLP  Monitor:  Weights, labs, diet advancement  Reason for Assessment: Malnutrition  80 y.o. male  Admitting Dx: Altered mental status  ASSESSMENT: Pt with h/o dementia, htn, ckd, seizure disorder lives in SNF comes in with sob and fever. More confused than usual. 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 SLP following. Pt discussed during multidisciplinary rounds.   -Pt alone in room, asleep, did not appear cachetic -Per conversation with [facility] staff, pt on puree/nectar thick diet and was eating 75-100% of meals with stable weight and getting Ensure once/day -Staff reports sometime pt could feed himself, other times he would need help    Height: Ht Readings from Last 1 Encounters:  06/01/13 5\' 8"  (1.727 m)    Weight: Wt Readings from Last 1 Encounters:  06/01/13 155 lb (70.308 kg)    Ideal Body Weight: 154 lbs   % Ideal Body Weight: 101%  Wt Readings from Last 10 Encounters:  06/01/13 155 lb (70.308 kg)  12/27/11 145 lb (65.772 kg)  11/18/11 155 lb (70.308 kg)  01/30/11 160 lb (72.576 kg)  12/29/10 164 lb 6.4 oz (74.571 kg)    Usual Body Weight: 155 lbs  % Usual Body Weight: 100%  BMI:  Body mass index is 23.57 kg/(m^2).  Estimated Nutritional Needs: Kcal: 4818-5631 Protein: 85-105g Fluid: 1.7-1.9L/day   Skin: Fissure on buttocks   Diet Order: NPO  EDUCATION NEEDS: -No education needs identified at this time   Intake/Output Summary (Last 24 hours) at 06/02/13 1212 Last data filed at 06/02/13 0350  Gross per 24 hour  Intake   3.38 ml  Output    1100 ml  Net -1096.62 ml    Last BM: 5/31  Labs:   Recent Labs Lab 05/31/13 2102 06/01/13 0710 06/02/13 0736  NA 139 141 140  K 4.3 4.3 4.1  CL 100 105 104  CO2 28 27 25   BUN 33* 27* 22  CREATININE 1.45* 1.29 1.44*  CALCIUM 9.6 8.9 8.8  GLUCOSE 111* 94 77    CBG (last 3)   Recent Labs  05/31/13 2115  GLUCAP 97    Scheduled Meds: . allopurinol  300 mg Oral q morning - 10a  . antiseptic oral rinse  15 mL Mouth Rinse BID  . aspirin  81 mg Oral Daily  . carbidopa-levodopa  0.5 tablet Oral BID  . clopidogrel  75 mg Oral Q breakfast  . divalproex  375 mg Oral 3 times per day  . escitalopram  10 mg Oral q morning - 10a  . lisinopril  5 mg Oral Daily  . piperacillin-tazobactam (ZOSYN)  IV  3.375 g Intravenous 3 times per day  . [START ON 06/03/2013] pneumococcal 23 valent vaccine  0.5 mL Intramuscular Tomorrow-1000  . vancomycin  500 mg Intravenous Q24H    Continuous Infusions:   Past Medical History  Diagnosis Date  . Lung cancer dx'd 2006    surg only  . Hypertension   . Stroke   . Parkinson disease   . Coronary artery disease   . Gout   . Dementia   . Angina   .  Shortness of breath   . TIA (transient ischemic attack)   . Renal disorder Chronic Kidney disease    Past Surgical History  Procedure Laterality Date  . Lasik    . Coronary angioplasty with stent placement    . Lobectomy      Mikey College MS, RD, LDN [phone] Pager [phone] Weekend/After Hours Pager

## 2013-06-03 DIAGNOSIS — G20 Parkinson's disease: Secondary | ICD-10-CM

## 2013-06-03 DIAGNOSIS — Z515 Encounter for palliative care: Secondary | ICD-10-CM

## 2013-06-03 LAB — CBC
HCT: 27.3 % — ABNORMAL LOW (ref 39.0–52.0)
HEMOGLOBIN: 8.5 g/dL — AB (ref 13.0–17.0)
MCH: 27.6 pg (ref 26.0–34.0)
MCHC: 31.1 g/dL (ref 30.0–36.0)
MCV: 88.6 fL (ref 78.0–100.0)
PLATELETS: 160 10*3/uL (ref 150–400)
RBC: 3.08 MIL/uL — AB (ref 4.22–5.81)
RDW: 16.5 % — ABNORMAL HIGH (ref 11.5–15.5)
WBC: 4.4 10*3/uL (ref 4.0–10.5)

## 2013-06-03 LAB — BASIC METABOLIC PANEL
BUN: 20 mg/dL (ref 6–23)
CALCIUM: 8.7 mg/dL (ref 8.4–10.5)
CO2: 27 mEq/L (ref 19–32)
Chloride: 106 mEq/L (ref 96–112)
Creatinine, Ser: 1.32 mg/dL (ref 0.50–1.35)
GFR calc Af Amer: 53 mL/min — ABNORMAL LOW (ref >90)
GFR, EST NON AFRICAN AMERICAN: 46 mL/min — AB (ref >90)
Glucose, Bld: 95 mg/dL (ref 70–99)
POTASSIUM: 4 meq/L (ref 3.7–5.3)
SODIUM: 141 meq/L (ref 137–147)

## 2013-06-03 MED ORDER — TRAMADOL HCL 50 MG PO TABS
50.0000 mg | ORAL_TABLET | Freq: Three times a day (TID) | ORAL | Status: DC
  Administered 2013-06-03 – 2013-06-05 (×6): 50 mg via ORAL
  Filled 2013-06-03 (×6): qty 1

## 2013-06-03 MED ORDER — SORBITOL 70 % SOLN
30.0000 mL | Freq: Every day | Status: DC | PRN
Start: 2013-06-03 — End: 2013-06-05

## 2013-06-03 MED ORDER — AMOXICILLIN-POT CLAVULANATE ER 1000-62.5 MG PO TB12
2.0000 | ORAL_TABLET | Freq: Two times a day (BID) | ORAL | Status: DC
  Administered 2013-06-03 – 2013-06-05 (×4): 2 via ORAL
  Filled 2013-06-03 (×5): qty 2

## 2013-06-03 NOTE — Progress Notes (Signed)
ANTIBIOTIC CONSULT NOTE - Follow-up  Pharmacy Consult for Vancomycin/Zosyn Indication: rule out sepsis  No Known Allergies  ~66 kg  Vital Signs: Temp: 99.8 F (37.7 C) (06/02 0616) Temp src: Oral (06/02 0616) BP: 147/74 mmHg (06/02 0957) Pulse Rate: 65 (06/02 0616)  Labs:  Recent Labs  06/01/13 0710 06/02/13 0736 06/03/13 0634  WBC 6.1 6.1 4.4  HGB 9.8* 8.7* 8.5*  PLT 177 169 160  CREATININE 1.29 1.44* 1.32   Assessment: 45 yom presented with AMS from SNF. He was started on broad-spectrum antibiotics for possible sepsis with unclear source. Pt is afebrile, WBC is WNL. Scr has been variable. Today it is down to 1.32. Planning palliative care meeting today to determine goals of care.   Vanc 5/30>> Zosyn 5/30>>  5/30 Blood - NGTD 5/30 Urine - NEG  Goal of Therapy:  Vancomycin trough level 15-20 mcg/ml  Plan:  1. Continue antibiotics as ordered 2. F/u renal fxn, C&S, clinical status and trough at SS 3. F/u results of palliative care meeting to determine LOT and possible de-escalation of antibiotics  Salome Arnt, PharmD, BCPS Pager # [phone] 06/03/2013 10:11 AM

## 2013-06-03 NOTE — Progress Notes (Addendum)
RN paged secondary to blood tinged fluid coming from the urethral meatus surrounding the Foley catheter. This NP reviewed chart and pt has a hx of hematuria this hospitalization. His anti coagulation medications were discontinued. Foley removed and will monitor UO. Can in and out cath prn and replace Foley in am if needed. Check H/H.  Clance Boll, NP [facility] 2am update: RN reported a moderately sized blood clot expelled from penis with urination x 1. H/H stable. Pt vitals stable. Will continue to monitor. Should pt decline, will speak to family about tests, action plan since palliative care has been consulted and pt is for discharge to Hospice today. KJKG, NP

## 2013-06-03 NOTE — Consult Note (Signed)
Patient Christopher Cook      DOB: [birth year]      Christopher Cook     Consult Note from the Palliative Medicine Team at [facility] [place] Requested by:  Christopher Burn, PA     PCP: Christopher Nettle, MD Reason for Consultation: [place] and options.     Phone [phone]  Assessment of patients Current state: Christopher Cook is a 80 yo male from [facility] assisted living admitted with altered mental status and found to have pneumonia. PMH significant for seizures, dementia, stroke, Parkinson's, lung cancer, CAD, shortness of breath. Now with dysphagia and high risk for recurrent aspiration pneumonia.   I met today with son, Christopher Cook (son HCPOA), Christopher Cook (daughter from [place]), and Christopher Cook (granddaughter). Family tells me that [name] was wheelchair bound, unable to walk, confused at times, and required pureed diet the past few weeks. He also had problems toileting - they said he knew mostly when he needed to void/BM but didn't always have time to get help for the restroom. We discussed his overall poor prognosis and the matter of when he next aspirates. We discussed comfort care in this case (oxygen, nebs, roxanol for dyspnea). Family tells me they do not want him coming back and forth to the hospital. We completed MOST form: DNR, comfort measures, no transfer to hospital, consider antibiotics, consider IV fluids for defined trial period, and no feeding tube. Prognosis is poor at weeks to months. Also discussed that we could consider hospice facility if he further declines over the next 1-2 days but for now a plan for SNF with palliative/hospice. I will continue to follow and support.   Goals of Care: 1.  Code Status: DNR   2. Scope of Treatment: 1. Vital Signs: yes  2. Respiratory/Oxygen: yes -  for comfort 3. Nutritional Support/Tube Feeds: no 4. Antibiotics: yes - continue as needed for now 5. IVF: continue as needed for now   4. Disposition: SNF with palliative or hospice  (hospice eligible). Possible hospice facility if decline.    3. Symptom Management:   1. Pain: Tramadol every 8 hours prn.  2. Bowel Regimen: Sorbitol prn.  3. Fever: Acetaminophen prn.  4. Nausea/Vomiting: Ondansetron prn.   4. Psychosocial: Emotional support provided to patient and family.    Patient Documents Completed or Given: Document Given Completed  Advanced Directives Pkt    MOST  yes  DNR  yes  Gone from [facility]    Hard Choices      Brief HPI: 80 yo male failure to thrive and progressive health decline with aspiration.    ROS: + pain left arm (h/o arthritis - pain not new), denies nausea/constipation/anxiety    PMH:  Past Medical History  Diagnosis Date  . Lung cancer dx'd 2006    surg only  . Hypertension   . Stroke   . Parkinson disease   . Coronary artery disease   . Gout   . Dementia   . Angina   . Shortness of breath   . TIA (transient ischemic attack)   . Renal disorder Chronic Kidney disease     PSH: Past Surgical History  Procedure Laterality Date  . Lasik    . Coronary angioplasty with stent placement    . Lobectomy     I have reviewed the FH and SH and  If appropriate update it with new information. No Known Allergies Scheduled Meds: . allopurinol  300 mg Oral q morning - 10a  .  antiseptic oral rinse  15 mL Mouth Rinse BID  . aspirin  81 mg Oral Daily  . carbidopa-levodopa  0.5 tablet Oral BID  . clopidogrel  75 mg Oral Q breakfast  . divalproex  375 mg Oral 3 times per day  . escitalopram  10 mg Oral q morning - 10a  . heparin subcutaneous  5,000 Units Subcutaneous 3 times per day  . lisinopril  5 mg Oral Daily  . piperacillin-tazobactam (ZOSYN)  IV  3.375 g Intravenous 3 times per day  . pneumococcal 23 valent vaccine  0.5 mL Intramuscular Tomorrow-1000  . vancomycin  500 mg Intravenous Q24H   Continuous Infusions: . sodium chloride 75 mL/hr at 06/02/13 2252   PRN Meds:.acetaminophen, acetaminophen, hydrALAZINE,  HYDROcodone-acetaminophen, meclizine, ondansetron (ZOFRAN) IV, ondansetron, RESOURCE THICKENUP CLEAR    BP 147/74  Pulse 65  Temp(Src) 99.8 F (37.7 C) (Oral)  Resp 18  Ht _0  (1.727 m)  Wt 70.308 kg (155 lb)  BMI 23.57 kg/m2  SpO2 98%   PPS: 30%   Intake/Output Summary (Last 24 hours) at 06/03/13 1040 Last data filed at 06/03/13 0900  Gross per 24 hour  Intake 809.75 ml  Output    875 ml  Net -65.25 ml   LBM: 06/01/13                         Physical Exam:  General: NAD, frail, elderly, thin HEENT: + temporal muscle wasting, no JVD, moist mucous membranes Chest: CTA throughout, shallow respirations, no labored breathing, symmetric CVS: RRR, S1 S2 Abdomen: Soft, NT, ND, +BS Ext: MAE, very weak lower extremities, warm to touch Neuro: Alert, oriented to person, mostly confused  Labs: CBC    Component Value Date/Time   WBC 4.4 06/03/2013 0634   WBC 2.4* 12/22/2010 0919   RBC 3.08* 06/03/2013 0634   RBC 4.50 12/22/2010 0919   HGB 8.5* 06/03/2013 0634   HGB 12.5* 12/22/2010 0919   HCT 27.3* 06/03/2013 0634   HCT 39.3 12/22/2010 0919   PLT 160 06/03/2013 0634   PLT 156 12/22/2010 0919   MCV 88.6 06/03/2013 0634   MCV 87.3 12/22/2010 0919   MCH 27.6 06/03/2013 0634   MCH 27.8 12/22/2010 0919   MCHC 31.1 06/03/2013 0634   MCHC 31.8* 12/22/2010 0919   RDW 16.5* 06/03/2013 0634   RDW 15.7* 12/22/2010 0919   LYMPHSABS 0.8 05/31/2013 2102   LYMPHSABS 0.7* 12/22/2010 0919   MONOABS 0.7 05/31/2013 2102   MONOABS 0.4 12/22/2010 0919   EOSABS 0.1 05/31/2013 2102   EOSABS 0.3 12/22/2010 0919   BASOSABS 0.0 05/31/2013 2102   BASOSABS 0.0 12/22/2010 0919    BMET    Component Value Date/Time   NA 141 06/03/2013 0634   NA 135 03/14/2010 1022   K 4.0 06/03/2013 0634   K 4.3 03/14/2010 1022   CL 106 06/03/2013 0634   CL 98 03/14/2010 1022   CO2 27 06/03/2013 0634   CO2 29 03/14/2010 1022   GLUCOSE 95 06/03/2013 0634   GLUCOSE 107 03/14/2010 1022   BUN 20 06/03/2013 0634   BUN 16 03/14/2010 1022    CREATININE 1.32 06/03/2013 0634   CREATININE 1.7* 03/14/2010 1022   CALCIUM 8.7 06/03/2013 0634   CALCIUM 9.5 03/14/2010 1022   GFRNONAA 46* 06/03/2013 0634   GFRAA 53* 06/03/2013 0634    CMP     Component Value Date/Time   NA 141 06/03/2013 0634   NA 135 03/14/2010  1022   K 4.0 06/03/2013 0634   K 4.3 03/14/2010 1022   CL 106 06/03/2013 0634   CL 98 03/14/2010 1022   CO2 27 06/03/2013 0634   CO2 29 03/14/2010 1022   GLUCOSE 95 06/03/2013 0634   GLUCOSE 107 03/14/2010 1022   BUN 20 06/03/2013 0634   BUN 16 03/14/2010 1022   CREATININE 1.32 06/03/2013 0634   CREATININE 1.7* 03/14/2010 1022   CALCIUM 8.7 06/03/2013 0634   CALCIUM 9.5 03/14/2010 1022   PROT 8.1 05/31/2013 2102   PROT 7.5 03/14/2010 1022   ALBUMIN 3.3* 05/31/2013 2102   AST 17 05/31/2013 2102   AST 23 03/14/2010 1022   ALT 9 05/31/2013 2102   ALT 17 03/14/2010 1022   ALKPHOS 63 05/31/2013 2102   ALKPHOS 46 03/14/2010 1022   BILITOT 0.3 05/31/2013 2102   BILITOT 0.70 03/14/2010 1022   GFRNONAA 46* 06/03/2013 0634   GFRAA 53* 06/03/2013 0634     Time In Time Out Total Time Spent with Patient Total Overall Time  1000 1115 64mn 757m    Greater than 50%  of this time was spent counseling and coordinating care related to the above assessment and plan.  AlVinie SillNP Palliative Medicine Team Pager # [phone]-F 8a-5p) Team Phone # [phone]/Weekends)

## 2013-06-03 NOTE — Progress Notes (Signed)
[facility] PROGRESS NOTE  Christopher Cook WVP:[id number] DOB: [birth year] DOA: 05/31/2013 PCP: Patricia Nettle, MD  Assessment/Plan:  Aspiration / HCAP Fever of 101.4 on 6/1.  Continues with low grade fever (99.8) on 6/2. Patient found to be aspirating on Speech Eval.   MBS ordered.  Dysphagia 1 diet recommended.  Patient tolerating it well. On vanc / Zosyn IV x 3 days. Will discontinue vanc today and continue Zosyn  Dr. Clementeen Graham had an indepth conversation with multiple family members who agree to DNR status and palliative medicine consult.  Focal Motor Seizures Neurology consulted.   Depakote increased. No further seizure activity. CT Head negative for acute abnormality.  Altered mental status With baseline severe dementia. Patient now alert, responds with some Yes and No as well as unintelligible sounds.    Family at bedside.  Normocytic Anemia Hgb slowly declining.  No frank bleeding. Probably  Mild on-going hematuria combined with IV hydration. Will discontinue aspirin, plavix and heparin.  Dementia Severe.  History of Stroke Plavix discontinued due to hematuria and progression towards palliative care.   HTN Moderately controlled with lisinopril. Lasix currently held.     Parkinsons Continue Sinemet   Palliative Medicine Appreciate consult MOST form completed.  Continue antibiotics Move towards Palliative vs Hospice in the next 24 - 48 hours. Plan to not return to the hospital.   DVT Prophylaxis:  Discontinued due to hematuria.   SCDs. Code Status: DNR Family Communication: Family meeting. Disposition Plan: To SNF when able.     Consultants:  Palliative  Antibiotics: Anti-infectives   Start     Dose/Rate Route Frequency Ordered Stop   06/01/13 2330  vancomycin (VANCOCIN) 500 mg in sodium chloride 0.9 % 100 mL IVPB  Status:  Discontinued     500 mg 100 mL/hr over 60 Minutes Intravenous Every 24 hours 05/31/13 2253 06/03/13 1207   06/01/13  0600  piperacillin-tazobactam (ZOSYN) IVPB 3.375 g     3.375 g 12.5 mL/hr over 240 Minutes Intravenous 3 times per day 05/31/13 2253     05/31/13 2230  piperacillin-tazobactam (ZOSYN) IVPB 3.375 g     3.375 g 100 mL/hr over 30 Minutes Intravenous  Once 05/31/13 2227 05/31/13 2346   05/31/13 2230  vancomycin (VANCOCIN) IVPB 1000 mg/200 mL premix     1,000 mg 200 mL/hr over 60 Minutes Intravenous  Once 05/31/13 2227 06/01/13 0012        HPI/Subjective: Denies pain.  Speech unintelligible.  Objective: Filed Vitals:   06/02/13 1555 06/02/13 2228 06/03/13 0616 06/03/13 0957  BP: 166/81 132/75 144/63 147/74  Pulse: 65 56 65   Temp:  99.5 F (37.5 C) 99.8 F (37.7 C)   TempSrc:  Oral Oral   Resp:  18 18   Height:      Weight:      SpO2:  94% 98%     Intake/Output Summary (Last 24 hours) at 06/03/13 1216 Last data filed at 06/03/13 0900  Gross per 24 hour  Intake 809.75 ml  Output    875 ml  Net -65.25 ml   Filed Weights   06/01/13 0146  Weight: 70.308 kg (155 lb)    Exam: General: Well developed, well nourished, NAD, severely demented. Does not make eye contact. HEENT:  Anicteic Sclera, MMM. No pharyngeal erythema or exudates  Neck: Supple, no JVD, no masses  Cardiovascular: RRR, S1 S2 auscultated, no rubs, murmurs or gallops.   Respiratory: Clear to auscultation bilaterally with equal chest rise.  Decreased breath  sounds on the right. No increased work of breathing. Abdomen: Soft, nontender, nondistended, + bowel sounds  Extremities: warm dry without cyanosis clubbing or edema.  Skin: Without rashes exudates or nodules.     Data Reviewed: Basic Metabolic Panel:  Recent Labs Lab 05/31/13 2102 06/01/13 0710 06/02/13 0736 06/03/13 0634  NA 139 141 140 141  K 4.3 4.3 4.1 4.0  CL 100 105 104 106  CO2 28 27 25 27   GLUCOSE 111* 94 77 95  BUN 33* 27* 22 20  CREATININE 1.45* 1.29 1.44* 1.32  CALCIUM 9.6 8.9 8.8 8.7   Liver Function Tests:  Recent Labs Lab  05/31/13 2102  AST 17  ALT 9  ALKPHOS 63  BILITOT 0.3  PROT 8.1  ALBUMIN 3.3*   CBC:  Recent Labs Lab 05/31/13 2102 06/01/13 0710 06/02/13 0736 06/03/13 0634  WBC 5.3 6.1 6.1 4.4  NEUTROABS 3.8  --   --   --   HGB 10.8* 9.8* 8.7* 8.5*  HCT 34.8* 31.5* 28.5* 27.3*  MCV 88.8 87.7 90.5 88.6  PLT 201 177 169 160   BNP (last 3 results)  Recent Labs  05/31/13 2124  PROBNP 1473.0*   CBG:  Recent Labs Lab 05/31/13 2115  GLUCAP 97    Recent Results (from the past 240 hour(s))  CULTURE, BLOOD (ROUTINE X 2)     Status: None   Collection Time    05/31/13  9:45 PM      Result Value Ref Range Status   Specimen Description BLOOD RIGHT ANTECUBITAL   Final   Special Requests BOTTLES DRAWN AEROBIC AND ANAEROBIC 5CC EA   Final   Culture  Setup Time     Final   Value: 06/01/2013 03:42     Performed at [facility]   Culture     Final   Value:        BLOOD CULTURE RECEIVED NO GROWTH TO DATE CULTURE WILL BE HELD FOR 5 DAYS BEFORE ISSUING A FINAL NEGATIVE REPORT     Performed at [facility]   Report Status PENDING   Incomplete  URINE CULTURE     Status: None   Collection Time    05/31/13 10:33 PM      Result Value Ref Range Status   Specimen Description URINE, CLEAN CATCH   Final   Special Requests NONE   Final   Culture  Setup Time     Final   Value: 05/31/2013 23:16     Performed at [facility]     Final   Value: NO GROWTH     Performed at [facility]   Culture     Final   Value: NO GROWTH     Performed at [facility]   Report Status 06/02/2013 FINAL   Final  CULTURE, BLOOD (ROUTINE X 2)     Status: None   Collection Time    05/31/13 10:38 PM      Result Value Ref Range Status   Specimen Description BLOOD RIGHT WRIST   Final   Special Requests BOTTLES DRAWN AEROBIC ONLY 1CC   Final   Culture  Setup Time     Final   Value: 06/01/2013 03:42     Performed at [facility]   Culture     Final    Value:        BLOOD CULTURE RECEIVED NO GROWTH TO DATE CULTURE WILL BE HELD FOR 5 DAYS  BEFORE ISSUING A FINAL NEGATIVE REPORT     Performed at [facility]   Report Status PENDING   Incomplete  MRSA PCR SCREENING     Status: None   Collection Time    06/01/13  1:44 AM      Result Value Ref Range Status   MRSA by PCR NEGATIVE  NEGATIVE Final   Comment:            The GeneXpert MRSA Assay (FDA     approved for NASAL specimens     only), is one component of a     comprehensive MRSA colonization     surveillance program. It is not     intended to diagnose MRSA     infection nor to guide or     monitor treatment for     MRSA infections.     Studies: Dg Chest Port 1 View  06/02/2013   CLINICAL DATA:  Shortness of breath, weakness.  EXAM: PORTABLE CHEST - 1 VIEW  COMPARISON:  05/31/2013  FINDINGS: Low lung volumes. Increasing left lower lobe atelectasis or infiltrate. Postoperative changes on the left. Right mid lung predominately platelike density, likely atelectasis or scarring. Mild cardiomegaly. No acute bony abnormality.  IMPRESSION: Low lung volumes with increasing left lower lobe atelectasis or infiltrate.   Electronically Signed   By: Rolm Baptise M.D.   On: 06/02/2013 08:10   Dg Swallowing Func-speech Pathology  06/02/2013   Katherene Ponto Deblois, CCC-SLP     06/02/2013  2:59 PM Objective Swallowing Evaluation: Modified Barium Swallowing Study   Patient Details  Name: Christopher Cook MRN: [id number] Date of Birth: 1921-12-28  Today's Date: 06/02/2013 Time: 1330-1400 SLP Time Calculation (min): 30 min  Past Medical History:  Past Medical History  Diagnosis Date  . Lung cancer dx'd 2006    surg only  . Hypertension   . Stroke   . Parkinson disease   . Coronary artery disease   . Gout   . Dementia   . Angina   . Shortness of breath   . TIA (transient ischemic attack)   . Renal disorder Chronic Kidney disease   Past Surgical History:  Past Surgical History  Procedure Laterality Date  . Lasik     . Coronary angioplasty with stent placement    . Lobectomy     HPI:  80 yo male h/o dementia, htn, ckd, Parkinsons disease, TIA,  seizure disorder lives in SNF comes in with sob and fever of  unknown etiology.cxr and ua are unrevealing. More confused than  usual. 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      Assessment / Plan / Recommendation Clinical Impression  Dysphagia Diagnosis: Moderate oral phase dysphagia;Moderate  pharyngeal phase dysphagia Clinical impression: Pt presents with a moderate oral dysphagia  with reduced awareness of boluses; requries visual and tactile  cues to appropriately accept PO. There is anterior spillage with  teaspoon and cup sip trials as well as slow, pulping tranist of  bolus to pharynx with significant premature spillage. Swallow is  delayed and liquids are silently penetrated and aspirated  before/during the swallow depending on bolus size. Base of tongue  is weak and epiglottic deflection is reduced due to structural  barrier (cervical hardware narrowing pharyngeal space), causing  incopmlete airway closure. Pt could not fully tuck chin (?  reduced ROM due to ACDF).  Pt was able to tolerate 1/2 teaspoon  boluses of nectar with a cues throat clear and second  swallow to  clear vallecular residuals. Pts Granddaughter present for  education via monitor and written instruciton.     Treatment Recommendation  Therapy as outlined in treatment plan below    Diet Recommendation Dysphagia 1 (Puree);Nectar-thick liquid   Liquid Administration via: Spoon Medication Administration: Crushed with puree Supervision: Staff to assist with self feeding;Full  supervision/cueing for compensatory strategies Compensations: Slow rate;Small sips/bites;Multiple dry swallows  after each bite/sip;Clear throat after each swallow Postural Changes and/or Swallow Maneuvers: Seated upright 90  degrees;Upright 30-60 min after meal    Other  Recommendations Oral Care Recommendations: Oral care BID Other  Recommendations: Order thickener from pharmacy   Follow Up Recommendations  Skilled Nursing facility    Frequency and Duration min 2x/week  2 weeks   Pertinent Vitals/Pain NA    SLP Swallow Goals     General HPI: 80 yo male h/o dementia, htn, ckd, Parkinsons  disease, TIA, seizure disorder lives in SNF comes in with sob and  fever of unknown etiology.cxr and ua are unrevealing. More  confused than usual. Has h/o old cva with residual left sided  weakness.  Type of Study: Modified Barium Swallowing Study Reason for Referral: Objectively evaluate swallowing function Previous Swallow Assessment: MBS noted 05/26/2005-no report found Diet Prior to this Study: NPO Temperature Spikes Noted: No Respiratory Status: Nasal cannula History of Recent Intubation: No Behavior/Cognition: Alert;Cooperative;Pleasant  mood;Confused;Requires cueing Oral Cavity - Dentition: Edentulous Oral Motor / Sensory Function: Impaired - see Bedside swallow  eval (generalized weakness) Self-Feeding Abilities: Needs assist Patient Positioning: Upright in chair Baseline Vocal Quality: Low vocal intensity Volitional Cough: Weak Volitional Swallow: Able to elicit Anatomy:  (cervical fusion hardware at C3/4?) Pharyngeal Secretions: Not observed secondary MBS    Reason for Referral Objectively evaluate swallowing function   Oral Phase Oral Preparation/Oral Phase Oral Phase: Impaired Oral - Nectar Oral - Nectar Teaspoon: Delayed oral transit;Right anterior bolus  loss;Weak lingual manipulation;Reduced posterior propulsion Oral - Nectar Cup: Delayed oral transit;Right anterior bolus  loss;Weak lingual manipulation;Reduced posterior propulsion Oral - Nectar Straw: Delayed oral transit;Right anterior bolus  loss;Weak lingual manipulation;Reduced posterior propulsion Oral - Thin Oral - Thin Teaspoon: Delayed oral transit;Right anterior bolus  loss;Weak lingual manipulation;Reduced posterior propulsion Oral - Thin Cup: Delayed oral transit;Right anterior bolus   loss;Weak lingual manipulation;Reduced posterior propulsion Oral - Thin Straw: Delayed oral transit;Right anterior bolus  loss;Weak lingual manipulation;Reduced posterior propulsion Oral - Solids Oral - Puree: Delayed oral transit   Pharyngeal Phase Pharyngeal Phase Pharyngeal Phase: Impaired Pharyngeal - Nectar Pharyngeal - Nectar Teaspoon: Premature spillage to pyriform  sinuses;Delayed swallow initiation;Reduced epiglottic  inversion;Reduced airway/laryngeal closure;Reduced tongue base  retraction;Penetration/Aspiration during  swallow;Penetration/Aspiration before swallow;Pharyngeal residue  - valleculae Penetration/Aspiration details (nectar teaspoon): Material enters  airway, remains ABOVE vocal cords and not ejected out;Material  does not enter airway Pharyngeal - Nectar Cup: Premature spillage to pyriform  sinuses;Delayed swallow initiation;Reduced epiglottic  inversion;Reduced airway/laryngeal closure;Reduced tongue base  retraction;Penetration/Aspiration during  swallow;Penetration/Aspiration before swallow;Pharyngeal residue  - valleculae;Trace aspiration Penetration/Aspiration details (nectar cup): Material enters  airway, CONTACTS cords and not ejected out Pharyngeal - Nectar Straw: Premature spillage to pyriform  sinuses;Delayed swallow initiation;Reduced epiglottic  inversion;Reduced airway/laryngeal closure;Reduced tongue base  retraction;Penetration/Aspiration during  swallow;Penetration/Aspiration before swallow;Pharyngeal residue  - valleculae;Trace aspiration Penetration/Aspiration details (nectar straw): Material enters  airway, CONTACTS cords and not ejected out Pharyngeal - Thin Pharyngeal - Thin Teaspoon: Premature spillage to pyriform  sinuses;Delayed swallow initiation;Reduced epiglottic  inversion;Reduced airway/laryngeal closure;Reduced tongue base  retraction;Penetration/Aspiration during  swallow;Penetration/Aspiration  before swallow;Pharyngeal residue  - valleculae;Trace aspiration  Penetration/Aspiration details (thin teaspoon): Material enters  airway, CONTACTS cords and not ejected out Pharyngeal - Thin Cup: Premature spillage to pyriform  sinuses;Delayed swallow initiation;Reduced epiglottic  inversion;Reduced airway/laryngeal closure;Reduced tongue base  retraction;Penetration/Aspiration during  swallow;Penetration/Aspiration before swallow;Pharyngeal residue  - valleculae;Trace aspiration Penetration/Aspiration details (thin cup): Material enters  airway, passes BELOW cords without attempt by patient to eject  out (silent aspiration) Pharyngeal - Thin Straw: Premature spillage to pyriform  sinuses;Delayed swallow initiation;Reduced epiglottic  inversion;Reduced airway/laryngeal closure;Reduced tongue base  retraction;Penetration/Aspiration during  swallow;Penetration/Aspiration before swallow;Pharyngeal residue  - valleculae;Trace aspiration Penetration/Aspiration details (thin straw): Material enters  airway, passes BELOW cords without attempt by patient to eject  out (silent aspiration) Pharyngeal - Solids Pharyngeal - Puree: Delayed swallow initiation;Reduced epiglottic  inversion;Reduced tongue base retraction;Pharyngeal residue -  valleculae  Cervical Esophageal Phase    GO    Cervical Esophageal Phase Cervical Esophageal Phase: Impaired Cervical Esophageal Phase - Comment Cervical Esophageal Comment: appearance of mild hesitation of PO  at mid esophageal column        Herbie Baltimore, MA CCC-SLP [phone]  Katherene Ponto Deblois 06/02/2013, 2:56 PM     Scheduled Meds: . allopurinol  300 mg Oral q morning - 10a  . antiseptic oral rinse  15 mL Mouth Rinse BID  . carbidopa-levodopa  0.5 tablet Oral BID  . divalproex  375 mg Oral 3 times per day  . escitalopram  10 mg Oral q morning - 10a  . lisinopril  5 mg Oral Daily  . piperacillin-tazobactam (ZOSYN)  IV  3.375 g Intravenous 3 times per day  . pneumococcal 23 valent vaccine  0.5 mL Intramuscular Tomorrow-1000  . traMADol  50  mg Oral Q8H   Continuous Infusions: . sodium chloride 75 mL/hr at 06/02/13 2252    Principal Problem:   Altered mental status Active Problems:   Parkinson disease   Dementia   Fever   SOB (shortness of breath)   Aspiration pneumonia    Melton Alar, PA-C [facility] Pager [phone]. If 7PM-7AM, please contact night-coverage at [email or web address], password [facility] 06/03/2013, 12:16 PM  LOS: 3 days

## 2013-06-03 NOTE — Clinical Social Work Psychosocial (Signed)
Clinical Social Work Department BRIEF PSYCHOSOCIAL ASSESSMENT 06/03/2013  Patient:  Christopher Cook, Christopher Cook     Account Number:  [id number]     Admit date:  05/31/2013  Clinical Social Worker:  Lovey Newcomer  Date/Time:  06/03/2013 11:00 AM  Referred by:  Physician  Date Referred:  06/03/2013 Referred for  ALF Placement   Other Referral:   Interview type:  Family Other interview type:   Patient unable to contribute to assessment at this time. CSW interviewed patient's family (son, daughter, and granddaughter) to complete assessment.    PSYCHOSOCIAL DATA Living Status:  FACILITY Admitted from facility:  [facility] Level of care:  Assisted Living Primary support name:  Christopher Cook Primary support relationship to patient:  FAMILY Degree of support available:   Support is very strong.    CURRENT CONCERNS Current Concerns  Post-Acute Placement   Other Concerns:    SOCIAL WORK ASSESSMENT / PLAN CSW met with patient's family, including daughter, son, and granddaughter to complete assessment. Patient's family stated that they were interested in long term SNF placement for patient with palliative services following. Patient's family is struggling with the recent decline in their loved one's health and show great concern in making sure he receives the best care possible. CSW explained the SNF search/placement process and answered family's questions. Family reports that patient has been living at [facility] and has had SNF experience in the past with [facility]. CSW explained the financial aspects of long term SNF placement and encouraged family to consider obtaining Medicaid for patient as his current monthly income (4200) will not be enough to cover long term SNF placement in the long run.    UPDATE 4:00PM: CSW has given family bed offers. Family states that they would like patient to go back to [facility] with hospice services as this  makes the most sense to them. CSW explained that CSW will contact [facility] and send required documentation to facility so they can determine whether or not they can bring patient back at discharge. CSW will followup with facility.   Assessment/plan status:  Psychosocial Support/Ongoing Assessment of Needs Other assessment/ plan:   Complete FL2, Fax to ALF, PASRR   Information/referral to community resources:   CSW contact information and SNF list given to family.    PATIENT'S/FAMILY'S RESPONSE TO PLAN OF CARE: Patient's family is saddened by the recent decline in their loved one's health. Patient's family expresses wanting to get the best possible care for patient. Family plans for patient to return to [facility] with hospice services. Family was engaged in assessment and appreciative of  CSW's visit. CSW will continue to provide support to family and assist with DC needs.       Liz Beach MSW, [place], [place], [phone]

## 2013-06-03 NOTE — Clinical Social Work Placement (Signed)
Clinical Social Work Department CLINICAL SOCIAL WORK PLACEMENT NOTE 06/03/2013  Patient:  Christopher Cook, Christopher Cook  Account Number:  [id number] [place] date:  05/31/2013  Clinical Social Worker:  Kemper Durie, [place]  Date/time:  06/03/2013 11:30 AM  Clinical Social Work is seeking post-discharge placement for this patient at the following level of care:   [place]   (*CSW will update this form in Epic as items are completed)   06/03/2013  Patient/family provided with [place] Department of Clinical Social Work's list of facilities offering this level of care within the geographic area requested by the patient (or if unable, by the patient's family).  06/03/2013  Patient/family informed of their freedom to choose among providers that offer the needed level of care, that participate in Medicare, Medicaid or managed care program needed by the patient, have an available bed and are willing to accept the patient.  06/03/2013  Patient/family informed of [facility]' ownership interest in [facility], as well as of the fact that they are under no obligation to receive care at this facility.  PASARR submitted to EDS on  PASARR number received from [place] on   FL2 transmitted to all facilities in geographic area requested by pt/family on  06/03/2013 FL2 transmitted to all facilities within larger geographic area on   Patient informed that his/her managed care company has contracts with or will negotiate with  certain facilities, including the following:     Patient/family informed of bed offers received:  06/03/2013 Patient chooses bed at  Physician recommends and patient chooses bed at    Patient to be transferred to  on   Patient to be transferred to facility by   The following physician request were entered in Epic:   Additional Comments: Family given bed offers but family has decided that they want patient to return to [facility] with hospice  services.   Liz Beach MSW, [place], [place], [phone]

## 2013-06-03 NOTE — Progress Notes (Signed)
Met c granddaughter and son at bedside-they request patient not be informed of the situation Patient himself is looking pretty good.  tol 70% po and afebrile.  Family reports he looks a lot better. +stool, no wheeze, no chills, no rigors Blowing harsh murmur L2ICs Family wants 'hospice'-they still seem a little torn He has had 3.5 days IV abx-will clarify goc a little more with family ion am and hope for d/c to [facility] in am.   Verneita Griffes, MD [facility] [phone]

## 2013-06-03 NOTE — Progress Notes (Signed)
Noticed patient's foley bag draining serosanguinous fluid. While cleaning the patient, he coughed and a moderate amount of serosanguinous fluid came out of penis. Baltazar Najjar, NP notified. Verbal orders to remove foley given. Will monitor urine output.

## 2013-06-03 NOTE — Evaluation (Signed)
Physical Therapy Evaluation Patient Details Name: Christopher Cook MRN: [id number] DOB: 07/31/1921 Today's Date: 06/03/2013   History of Present Illness  80 yo male h/o dementia, htn, ckd, seizure disorder lives in SNF comes in with sob and fever.  More confused than usual. H/o stroke with L side residual  Clinical Impression  Pt assisted to sitting and standing at the edge of the bed with 2 person total assist. Prior level of function unknown . Pt will benefit from Trial PT to address problems. Note that Palliative consult is pending.    Follow Up Recommendations SNF (or return to ALF if  able to provide  care required.)    Equipment Recommendations  None recommended by PT    Recommendations for Other Services       Precautions / Restrictions Precautions Precautions: Fall Precaution Comments: spiration      Mobility  Bed Mobility Overal bed mobility: +2 for physical assistance;+ 2 for safety/equipment;Needs Assistance Bed Mobility: Supine to Sit;Sit to Supine     Supine to sit: Total assist;+2 for physical assistance;+2 for safety/equipment Sit to supine: Total assist;+2 for physical assistance;+2 for safety/equipment      Transfers Overall transfer level: Needs assistance Equipment used: 2 person hand held assist Transfers: Sit to/from Stand Sit to Stand: From elevated surface;Total assist;+2 physical assistance;+2 safety/equipment         General transfer comment: stood x 2  with 2 persons, listing to the L. pt does bear weight on R leg,   Ambulation/Gait                Stairs            Wheelchair Mobility    Modified Rankin (Stroke Patients Only)       Balance Overall balance assessment: Needs assistance;History of Falls Sitting-balance support: Feet supported;Bilateral upper extremity supported Sitting balance-Leahy Scale: Poor Sitting balance - Comments: pt is able to sit at mid line and does not list   Standing balance support: Bilateral  upper extremity supported;During functional activity Standing balance-Leahy Scale: Zero                               Pertinent Vitals/Pain Difficult to assess.    Home Living Family/patient expects to be discharged to:: ?Assisted living [facility]) Uncertain disposition                 Additional Comments: no family present for Prior function. noted Palliative Care consult    Prior Function Level of Independence: Needs assistance         Comments: pt is unable to clearly respond to questions related to Prior function     Hand Dominance        Extremity/Trunk Assessment   Upper Extremity Assessment: LUE deficits/detail;RUE deficits/detail RUE Deficits / Details: able to hold to back of chair for standing     LUE Deficits / Details: noted decreased ROM hand , shoulder. does not functionally use   Lower Extremity Assessment: RLE deficits/detail;LLE deficits/detail RLE Deficits / Details: pt does bear weight on LE when standing       Communication      Cognition Arousal/Alertness: Awake/alert Behavior During Therapy: Flat affect Overall Cognitive Status: Difficult to assess                      General Comments      Exercises  Assessment/Plan    PT Assessment Patient needs continued PT services (trial, depending on GOC)  PT Diagnosis Generalized weakness;Altered mental status;Hemiplegia non-dominant side   PT Problem List Decreased strength;Decreased activity tolerance;Decreased balance;Decreased knowledge of use of DME;Decreased mobility;Decreased range of motion;Decreased cognition;Impaired sensation;Impaired tone;Decreased coordination  PT Treatment Interventions     PT Goals (Current goals can be found in the Care Plan section) Acute Rehab PT Goals Patient Stated Goal: none stated PT Goal Formulation: Patient unable to participate in goal setting Time For Goal Achievement: 06/17/13 Potential to Achieve Goals:  Fair    Frequency  2 x   Barriers to discharge        Co-evaluation               End of Session   Activity Tolerance: Patient tolerated treatment well Patient left: in bed;with call bell/phone within reach;with bed alarm set Nurse Communication: Need for lift equipment;Mobility status         Time: 1425-1450 PT Time Calculation (min): 25 min   Charges:   PT Evaluation $Initial PT Evaluation Tier I: 1 Procedure PT Treatments $Therapeutic Activity: 23-37 mins   PT G Codes:          Claretha Cooper 06/03/2013, 4:19 PM Tresa Endo PT [phone]

## 2013-06-03 NOTE — Clinical Social Work Note (Signed)
Family has decided on patient returning to [facility] with palliative services if possible as opposed to going to SNF. CSW will send required documents to [facility]. Family has questions about "home with hospice services" that would be offered to patient. CSW has requested assistance from [facility] with this aspect of DC. CSW will follow up with family in the morning.    Liz Beach MSW, [place], [place], [phone]

## 2013-06-03 NOTE — Care Management Note (Addendum)
    Page 1 of 2   06/05/2013     2:13:21 PM CARE MANAGEMENT NOTE 06/05/2013  Patient:  Christopher Cook, Christopher Cook   Account Number:  [id number]  Date Initiated:  06/03/2013  Documentation initiated by:  Tomi Bamberger  Subjective/Objective Assessment:   dx dementia, parkisnon, cva, pna  admit- from [facility].     Action/Plan:   Hospice at ALF   Anticipated DC Date:  06/05/2013   Anticipated DC Plan:  HOME W HOSPICE CARE  In-house referral  Clinical Social Worker      DC Planning Services  CM consult      PAC Choice  HOSPICE   Choice offered to / List presented to:  C-2 HC POA / Guardian   DME arranged  [facility]      DME agency  [place] arranged  HH-1 RN      [facility] agency  [facility]ICE [facility] P[facility] CARE OF [place]   Status of service:  Completed, signed off Medicare Important Message given?  YES (If response is "NO", the following Medicare IM given date fields will be blank) Date Medicare IM given:  06/04/2013 Date Additional Medicare IM given:    Discharge Disposition:  [place]  Per UR Regulation:  Reviewed for med. necessity/level of care/duration of stay  If discussed at [place] of Stay Meetings, dates discussed:    Comments:  06/04/13 [place], BSN [phone] patient will have dme delivered to ALF today, patient for dc 6/4 via ambulance, CSW aware.  Family unavailabe to meet with [facility] RN today at facility due to work commitments but will be able to meet on 6/4.  06/03/13 1628 Tomi Bamberger RN, BSN [phone] spoke with Ferdinand Lango, his cell is [phone], states they would like patient to go back to [facility] with [facility] , referral made to [place] notifed as well as the facility.

## 2013-06-04 LAB — HEMOGLOBIN AND HEMATOCRIT, BLOOD
HCT: 28.3 % — ABNORMAL LOW (ref 39.0–52.0)
Hemoglobin: 8.7 g/dL — ABNORMAL LOW (ref 13.0–17.0)

## 2013-06-04 MED ORDER — DIVALPROEX SODIUM 125 MG PO CPSP: ORAL_CAPSULE | ORAL | Status: AC

## 2013-06-04 MED ORDER — AMOXICILLIN-POT CLAVULANATE ER 1000-62.5 MG PO TB12: 2.0000 | ORAL_TABLET | Freq: Two times a day (BID) | ORAL | Status: AC

## 2013-06-04 MED ORDER — ACETAMINOPHEN 325 MG PO TABS: 650.0000 mg | ORAL_TABLET | Freq: Four times a day (QID) | ORAL | Status: AC | PRN

## 2013-06-04 MED ORDER — LISINOPRIL 10 MG PO TABS: 10.0000 mg | ORAL_TABLET | Freq: Every day | ORAL | Status: AC

## 2013-06-04 MED ORDER — HYDROCODONE-ACETAMINOPHEN 5-325 MG PO TABS: 1.0000 | ORAL_TABLET | Freq: Three times a day (TID) | ORAL | Status: AC | PRN

## 2013-06-04 MED ORDER — RESOURCE THICKENUP CLEAR PO POWD: 1.0000 g | ORAL | Status: AC | PRN

## 2013-06-04 MED ORDER — TRAMADOL HCL 50 MG PO TABS: 50.0000 mg | ORAL_TABLET | Freq: Three times a day (TID) | ORAL | Status: AC

## 2013-06-04 MED ORDER — ONDANSETRON HCL 4 MG PO TABS: 4.0000 mg | ORAL_TABLET | Freq: Four times a day (QID) | ORAL | Status: AC | PRN

## 2013-06-04 NOTE — Progress Notes (Signed)
Addendum:   Discharge delayed.  1 more day needed for medical equipment to be delivered to ALF in preparation to care for Christopher Cook. Imogene Burn, PA-C [facility] Pager: [phone]

## 2013-06-04 NOTE — Consult Note (Signed)
I have reviewed this case with our NP and agree with the Assessment and Plan as stated.  Kyrin Gratz L. Ieisha Gao, MD MBA The Palliative Medicine Team at [place] Team Phone: [phone] Pager: [phone]   

## 2013-06-04 NOTE — Progress Notes (Signed)
Notified by [name], patient and family request services of [facility]) after discharge.  Patient information reviewed with Dr Shelbie Proctor, [facility] physician hospice eligibility confirmed. Spoke with [name] and [name] away from bedside to initiate education related to hospice services, philosophy and team approach to care, they voice good understanding of information provided. They shared that because of previous work commitments, they are unable to be available this afternoon to meet with [facility] assessment nurse and request needed DME be arranged to be delivered and in place today for discharge back to [facility] tommorow by non-emergent transport. This was discussed with CMRN Neoma Laming and CSW Marshell Levan.  *Family hopeful for all DME arrangements in place today and pt to be able to d/c by 12 noon Thursday 06/05/13 to allow for [facility] Assessment RN to see pt and family by 2:30 pm Thursday afternoon *Please send completed GOLD DNR Form back to facility with patient  DME needs discussed with family and [facility] coordinator [name] (contact cell # [phone])   Complete package D: Fully electric hospital bed with AP&P mattress and over-bed table and complete Oxygen package B as pt is on 2LNC O2 continuous has been ordered by [facility] DME manager [name] and [facility] representative will contact [place] on cell # [phone] or, if unable to reach [place]- facility main desk # [phone]   Initial paperwork faxed to [facility]  Please notify [facility] when patient is ready to leave unit at d/c call [phone] (or [phone] if after 5 pm) [facility] information and contact numbers also given to [name] during visit.   Above information shared with [facility] Please call with any questions or concerns   Danton Sewer, RN 06/04/2013, 10:11 AM [facility] Liaison [phone]

## 2013-06-04 NOTE — Clinical Social Work Note (Signed)
DC summary and updated FL2 faxed to ALF for review. CSW waiting for call back from facility to DC patient.    Liz Beach MSW, [place], [place], [phone]

## 2013-06-04 NOTE — Progress Notes (Signed)
Progress Note from the Palliative Medicine Team at [place]: Mr. Andujo is resting comfortably today and seems to be in good spirits. He says his arm feels a little better today (began scheduled tramadol yesterday). Family is talking with hospice RN and they tell me that they will plan on discharge tomorrow hopefully as they have to prepare his room at [facility] to have everything he needs upon discharge. Family has no further questions/concerns at this time.    Objective: No Known Allergies Scheduled Meds: . allopurinol  300 mg Oral q morning - 10a  . amoxicillin-clavulanate  2 tablet Oral Q12H  . antiseptic oral rinse  15 mL Mouth Rinse BID  . carbidopa-levodopa  0.5 tablet Oral BID  . divalproex  375 mg Oral 3 times per day  . escitalopram  10 mg Oral q morning - 10a  . lisinopril  5 mg Oral Daily  . pneumococcal 23 valent vaccine  0.5 mL Intramuscular Tomorrow-1000  . traMADol  50 mg Oral Q8H   Continuous Infusions: . sodium chloride 75 mL/hr at 06/02/13 2252   PRN Meds:.acetaminophen, acetaminophen, hydrALAZINE, HYDROcodone-acetaminophen, meclizine, ondansetron (ZOFRAN) IV, ondansetron, RESOURCE THICKENUP CLEAR, sorbitol  BP 154/69  Pulse 88  Temp(Src) 97.9 F (36.6 C) (Oral)  Resp 20  Ht 5\' 8"  (1.727 m)  Wt 70.308 kg (155 lb)  BMI 23.57 kg/m2  SpO2 97%   PPS: 30% at best     Intake/Output Summary (Last 24 hours) at 06/04/13 1552 Last data filed at 06/04/13 8242  Gross per 24 hour  Intake    100 ml  Output    700 ml  Net   -600 ml      LBM: 06/01/13      Physical Exam:  General: NAD, frail, elderly, thin  HEENT: + temporal muscle wasting, no JVD, moist mucous membranes  Chest: CTA throughout, shallow respirations, no labored breathing, symmetric  CVS: RRR, S1 S2  Abdomen: Soft, NT, ND, +BS  Ext: MAE, very weak lower extremities, warm to touch  Neuro: Alert, oriented to person, mostly confused    Labs: CBC    Component Value Date/Time    WBC 4.4 06/03/2013 0634   WBC 2.4* 12/22/2010 0919   RBC 3.08* 06/03/2013 0634   RBC 4.50 12/22/2010 0919   HGB 8.5* 06/03/2013 0634   HGB 12.5* 12/22/2010 0919   HCT 27.3* 06/03/2013 0634   HCT 39.3 12/22/2010 0919   PLT 160 06/03/2013 0634   PLT 156 12/22/2010 0919   MCV 88.6 06/03/2013 0634   MCV 87.3 12/22/2010 0919   MCH 27.6 06/03/2013 0634   MCH 27.8 12/22/2010 0919   MCHC 31.1 06/03/2013 0634   MCHC 31.8* 12/22/2010 0919   RDW 16.5* 06/03/2013 0634   RDW 15.7* 12/22/2010 0919   LYMPHSABS 0.8 05/31/2013 2102   LYMPHSABS 0.7* 12/22/2010 0919   MONOABS 0.7 05/31/2013 2102   MONOABS 0.4 12/22/2010 0919   EOSABS 0.1 05/31/2013 2102   EOSABS 0.3 12/22/2010 0919   BASOSABS 0.0 05/31/2013 2102   BASOSABS 0.0 12/22/2010 0919    BMET    Component Value Date/Time   NA 141 06/03/2013 0634   NA 135 03/14/2010 1022   K 4.0 06/03/2013 0634   K 4.3 03/14/2010 1022   CL 106 06/03/2013 0634   CL 98 03/14/2010 1022   CO2 27 06/03/2013 0634   CO2 29 03/14/2010 1022   GLUCOSE 95 06/03/2013 0634   GLUCOSE 107 03/14/2010 1022   BUN 20  06/03/2013 0634   BUN 16 03/14/2010 1022   CREATININE 1.32 06/03/2013 0634   CREATININE 1.7* 03/14/2010 1022   CALCIUM 8.7 06/03/2013 0634   CALCIUM 9.5 03/14/2010 1022   GFRNONAA 46* 06/03/2013 0634   GFRAA 53* 06/03/2013 0634    CMP     Component Value Date/Time   NA 141 06/03/2013 0634   NA 135 03/14/2010 1022   K 4.0 06/03/2013 0634   K 4.3 03/14/2010 1022   CL 106 06/03/2013 0634   CL 98 03/14/2010 1022   CO2 27 06/03/2013 0634   CO2 29 03/14/2010 1022   GLUCOSE 95 06/03/2013 0634   GLUCOSE 107 03/14/2010 1022   BUN 20 06/03/2013 0634   BUN 16 03/14/2010 1022   CREATININE 1.32 06/03/2013 0634   CREATININE 1.7* 03/14/2010 1022   CALCIUM 8.7 06/03/2013 0634   CALCIUM 9.5 03/14/2010 1022   PROT 8.1 05/31/2013 2102   PROT 7.5 03/14/2010 1022   ALBUMIN 3.3* 05/31/2013 2102   AST 17 05/31/2013 2102   AST 23 03/14/2010 1022   ALT 9 05/31/2013 2102   ALT 17 03/14/2010 1022   ALKPHOS 63 05/31/2013 2102    ALKPHOS 46 03/14/2010 1022   BILITOT 0.3 05/31/2013 2102   BILITOT 0.70 03/14/2010 1022   GFRNONAA 46* 06/03/2013 0634   GFRAA 53* 06/03/2013 0634    Assessment and Plan: 1. Code Status: DNR 2. Symptom Control:  1. Pain: Tramadol every 8 hours prn. Vicodin prn.  2. Bowel Regimen: Sorbitol prn.  3. Fever: Acetaminophen prn.  4. Nausea/Vomiting: Ondansetron prn.  3. Psycho/Social: Emotional support provided to patient and family.  4. Disposition: SNF with hospice.     Time In Time Out Total Time Spent with Patient Total Overall Time  0930 0950 45min 69min    Greater than 50%  of this time was spent counseling and coordinating care related to the above assessment and plan.  Vinie Sill, NP Palliative Medicine Team Pager # [phone] (M-F 8a-5p) Team Phone # [phone] (Nights/Weekends)

## 2013-06-04 NOTE — Discharge Summary (Addendum)
Physician Discharge Summary  Christopher Cook YIR:[id number] DOB: Jun 08, 1921 DOA: 05/31/2013  PCP: Patricia Nettle, MD  Admit date: 05/31/2013 Discharge date: 06/05/2013  Time spent: 50 minutes  Recommendations for Outpatient Follow-up:  1. Discharge to [place] with Hospice support 2. Please see Pink MOST form (Medical scope of treatment) on the chart.  Family does not want patient to return to the hospital. 3. Aspiration precautions.  Puree diet.  Give medications in puree. 4. Per Hospice:  Recommend Half bed rails.  Assist with turn and position.  Patient with significant weakness 5. Check BP.  Lisinopril increased.  Lasix stopped.  May need adjustment.  Discharge Diagnoses:  Principal Problem:   Altered mental status Active Problems:   Parkinson disease   Dementia   Fever   SOB (shortness of breath)   Aspiration pneumonia   Palliative care encounter   Discharge Condition: stable.  Diet recommendation: dysphagia 1  Filed Weights   06/01/13 0146  Weight: 70.308 kg (155 lb)    History of present illness:  80 yo male h/o dementia, htn, ckd, seizure disorder lives in SNF comes in with sob and fever. More confused than usual. 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 He is at his baseline right now. He denies any pain. He knows he is at the hospital but does not know the year. His family was concerned after a visit today and so wanted him to come to ED for evaluation. Initially it was thought he was a code stroke, but this was canceled once evaluated by neurology team.  Patient was found to have significant PNA on cxr.  Hospital Course:  Aspiration / HCAP  Fever of 101.4 on 6/1. Continued with low grade fever (99.8) on 6/2. Currently Afebrile at the time of discharge. Patient found to be aspirating on Speech Eval. MBS ordered. Dysphagia 1 diet recommended. Patient tolerating it well.  On vanc / Zosyn IV x 3.5 days.  Then progressed to Augmentin.  Will  discharge with a total antibiotic course of 10 days. Unfortunately patient with likely continue to aspirate.  Family aware.  Patient is now a DNR on Hospice.  Focal Motor Seizures  Neurology consulted. Depakote increased to 375 mg TID. No further seizure activity.  CT Head negative for acute abnormality.   Altered mental status  With baseline severe dementia.  Patient now alert, responds with some Yes and No as well as unintelligible sounds.   Normocytic Anemia with mild hematuria Hgb slowly declining. No frank bleeding. Probably Mild on-going hematuria combined with IV hydration.  Will discontinue aspirin, plavix and heparin.   HGb 8.5 at the time of discharge.    Dementia  Severe.   History of Stroke  Plavix discontinued due to hematuria and progression towards Hospice care.   HTN  Moderately controlled with lisinopril.  Lasix discontinued.  Parkinsons  Continue Sinemet   Palliative Medicine Appreciate consult  MOST form completed.  See Goals of care below. Discharge to ALF with hospice support. Plan to not return to the hospital.   Goals of Care:  1. Code Status: DNR  2. Scope of Treatment:  1. Respiratory/Oxygen: yes - for comfort 2. Nutritional Support/Tube Feeds: no 3. Antibiotics: yes - continue as needed for now  3. Disposition: ALF with hospice (hospice eligible). Possible hospice facility if decline.  4. Symptom Management:  1. Pain: Tramadol every 8 hours prn.  2. Bowel Regimen: Sorbitol prn.  3. Fever: Acetaminophen prn.  4. Nausea/Vomiting: Ondansetron prn.  Procedures:  Modified Barium Swallow study completed.  Consultations:  Neurology  Palliative  Discharge Exam: Filed Vitals:   06/05/13 1038  BP: 140/76  Pulse:   Temp:   Resp:    General: Well developed, well nourished, NAD, severely demented. Does not make eye contact. Tries to respond to questions. HEENT: Anicteic Sclera, MMM. No pharyngeal erythema or exudates  Neck: Supple,  no JVD, no masses  Cardiovascular: RRR, S1 S2 auscultated, no rubs, murmurs or gallops.  Respiratory: Clear to auscultation bilaterally with equal chest rise. Decreased breath sounds on the right. No increased work of breathing.  Abdomen: Soft, nontender, nondistended, + bowel sounds  Extremities: warm dry without cyanosis clubbing or edema.  Skin: Without rashes exudates or nodules.     Discharge Instructions       Discharge Instructions   Diet general    Complete by:  As directed   Dysphagia 1 diet (Puree)            Medication List    STOP taking these medications       allopurinol 300 MG tablet  Commonly known as:  ZYLOPRIM     aspirin 81 MG chewable tablet     clopidogrel 75 MG tablet  Commonly known as:  PLAVIX     furosemide 20 MG tablet  Commonly known as:  LASIX      TAKE these medications       acetaminophen 325 MG tablet  Commonly known as:  TYLENOL  Take 2 tablets (650 mg total) by mouth every 6 (six) hours as needed for mild pain (or Fever >/= 101).     amoxicillin-clavulanate 1000-62.5 MG per tablet  Commonly known as:  AUGMENTIN XR  Take 2 tablets by mouth every 12 (twelve) hours.     carbidopa-levodopa 25-100 MG per tablet  Commonly known as:  SINEMET IR  Take 0.5 tablets by mouth 2 (two) times daily.     divalproex 125 MG capsule  Commonly known as:  DEPAKOTE SPRINKLE  3 capsules every 8 hours.     escitalopram 10 MG tablet  Commonly known as:  LEXAPRO  Take 10 mg by mouth every morning.     HYDROcodone-acetaminophen 5-325 MG per tablet  Commonly known as:  NORCO/VICODIN  Take 1 tablet by mouth every 8 (eight) hours as needed for moderate pain.     lisinopril 10 MG tablet  Commonly known as:  PRINIVIL,ZESTRIL  Take 1 tablet (10 mg total) by mouth daily.     meclizine 25 MG tablet  Commonly known as:  ANTIVERT  Take 12.5 mg by mouth 2 (two) times daily as needed for dizziness.     ondansetron 4 MG tablet  Commonly known as:   ZOFRAN  Take 1 tablet (4 mg total) by mouth every 6 (six) hours as needed for nausea.     RESOURCE THICKENUP CLEAR Powd  Take 1 g by mouth as needed.     traMADol 50 MG tablet  Commonly known as:  ULTRAM  Take 1 tablet (50 mg total) by mouth every 8 (eight) hours.       No Known Allergies    The results of significant diagnostics from this hospitalization (including imaging, microbiology, ancillary and laboratory) are listed below for reference.    Significant Diagnostic Studies: Ct Head (brain) Wo Contrast  05/31/2013   CLINICAL DATA:  80 year old male code stroke. aphasia, found with altered mental status. Initial encounter.  EXAM: CT HEAD WITHOUT CONTRAST  TECHNIQUE: Contiguous  axial images were obtained from the base of the skull through the vertex without intravenous contrast.  COMPARISON:  04/29/2013 and earlier.  FINDINGS: Stable parotid space calcifications. Stable scalp and orbits soft tissues. Mild mucosal thickening in the right maxillary sinus is new along with a small fluid level. Other Visualized paranasal sinuses and mastoids are clear. No acute osseous abnormality identified.  Calcified atherosclerosis at the skull base. Stable cerebral volume. Mild ventriculomegaly and chronic bilateral MCA cortical encephalomalacia. Widespread hypodensity in the thalami I compatible with advanced chronic small vessel ischemia. Less pronounced but similar are hypodensity in the pons. No suspicious intracranial vascular hyperdensity. No evidence of cortically based acute infarction identified. No acute intracranial hemorrhage identified. No midline shift, mass effect, or evidence of intracranial mass lesion.  IMPRESSION: Chronic ischemic disease appears stable. No acute intracranial abnormality.   Electronically Signed   By: Lars Pinks M.D.   On: 05/31/2013 21:16   Dg Chest Port 1 View  06/02/2013   CLINICAL DATA:  Shortness of breath, weakness.  EXAM: PORTABLE CHEST - 1 VIEW  COMPARISON:   05/31/2013  FINDINGS: Low lung volumes. Increasing left lower lobe atelectasis or infiltrate. Postoperative changes on the left. Right mid lung predominately platelike density, likely atelectasis or scarring. Mild cardiomegaly. No acute bony abnormality.  IMPRESSION: Low lung volumes with increasing left lower lobe atelectasis or infiltrate.   Electronically Signed   By: Rolm Baptise M.D.   On: 06/02/2013 08:10   Dg Chest Port 1 View  05/31/2013   CLINICAL DATA:  Weakness, fever  EXAM: PORTABLE CHEST - 1 VIEW  COMPARISON:  Chest radiograph 04/29/2013  FINDINGS: Stable enlarged cardiac silhouette. There is chronic atelectasis in the right middle lobe. Some volume loss in the left hemi thorax. Normal pulmonary vasculature.  IMPRESSION: No change from prior.  No acute findings.   Electronically Signed   By: Suzy Bouchard M.D.   On: 05/31/2013 21:46   Dg Swallowing Func-speech Pathology  06/02/2013   Katherene Ponto Deblois, CCC-SLP     06/02/2013  2:59 PM Objective Swallowing Evaluation: Modified Barium Swallowing Study   Patient Details  Name: Christopher Cook MRN: [id number] Date of Birth: 1921-06-11  Today's Date: 06/02/2013 Time: 1330-1400 SLP Time Calculation (min): 30 min  Past Medical History:  Past Medical History  Diagnosis Date  . Lung cancer dx'd 2006    surg only  . Hypertension   . Stroke   . Parkinson disease   . Coronary artery disease   . Gout   . Dementia   . Angina   . Shortness of breath   . TIA (transient ischemic attack)   . Renal disorder Chronic Kidney disease   Past Surgical History:  Past Surgical History  Procedure Laterality Date  . Lasik    . Coronary angioplasty with stent placement    . Lobectomy     HPI:  80 yo male h/o dementia, htn, ckd, Parkinsons disease, TIA,  seizure disorder lives in SNF comes in with sob and fever of  unknown etiology.cxr and ua are unrevealing. More confused than  usual. 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      Assessment / Plan / Recommendation  Clinical Impression  Dysphagia Diagnosis: Moderate oral phase dysphagia;Moderate  pharyngeal phase dysphagia Clinical impression: Pt presents with a moderate oral dysphagia  with reduced awareness of boluses; requries visual and tactile  cues to appropriately accept PO. There is anterior spillage with  teaspoon and cup sip trials  as well as slow, pulping tranist of  bolus to pharynx with significant premature spillage. Swallow is  delayed and liquids are silently penetrated and aspirated  before/during the swallow depending on bolus size. Base of tongue  is weak and epiglottic deflection is reduced due to structural  barrier (cervical hardware narrowing pharyngeal space), causing  incopmlete airway closure. Pt could not fully tuck chin (?  reduced ROM due to ACDF).  Pt was able to tolerate 1/2 teaspoon  boluses of nectar with a cues throat clear and second swallow to  clear vallecular residuals. Pts Granddaughter present for  education via monitor and written instruciton.     Treatment Recommendation  Therapy as outlined in treatment plan below    Diet Recommendation Dysphagia 1 (Puree);Nectar-thick liquid   Liquid Administration via: Spoon Medication Administration: Crushed with puree Supervision: Staff to assist with self feeding;Full  supervision/cueing for compensatory strategies Compensations: Slow rate;Small sips/bites;Multiple dry swallows  after each bite/sip;Clear throat after each swallow Postural Changes and/or Swallow Maneuvers: Seated upright 90  degrees;Upright 30-60 min after meal    Other  Recommendations Oral Care Recommendations: Oral care BID Other Recommendations: Order thickener from pharmacy   Follow Up Recommendations  Skilled Nursing facility    Frequency and Duration min 2x/week  2 weeks   Pertinent Vitals/Pain NA    SLP Swallow Goals     General HPI: 80 yo male h/o dementia, htn, ckd, Parkinsons  disease, TIA, seizure disorder lives in SNF comes in with sob and  fever of unknown  etiology.cxr and ua are unrevealing. More  confused than usual. Has h/o old cva with residual left sided  weakness.  Type of Study: Modified Barium Swallowing Study Reason for Referral: Objectively evaluate swallowing function Previous Swallow Assessment: MBS noted 05/26/2005-no report found Diet Prior to this Study: NPO Temperature Spikes Noted: No Respiratory Status: Nasal cannula History of Recent Intubation: No Behavior/Cognition: Alert;Cooperative;Pleasant  mood;Confused;Requires cueing Oral Cavity - Dentition: Edentulous Oral Motor / Sensory Function: Impaired - see Bedside swallow  eval (generalized weakness) Self-Feeding Abilities: Needs assist Patient Positioning: Upright in chair Baseline Vocal Quality: Low vocal intensity Volitional Cough: Weak Volitional Swallow: Able to elicit Anatomy:  (cervical fusion hardware at C3/4?) Pharyngeal Secretions: Not observed secondary MBS    Reason for Referral Objectively evaluate swallowing function   Oral Phase Oral Preparation/Oral Phase Oral Phase: Impaired Oral - Nectar Oral - Nectar Teaspoon: Delayed oral transit;Right anterior bolus  loss;Weak lingual manipulation;Reduced posterior propulsion Oral - Nectar Cup: Delayed oral transit;Right anterior bolus  loss;Weak lingual manipulation;Reduced posterior propulsion Oral - Nectar Straw: Delayed oral transit;Right anterior bolus  loss;Weak lingual manipulation;Reduced posterior propulsion Oral - Thin Oral - Thin Teaspoon: Delayed oral transit;Right anterior bolus  loss;Weak lingual manipulation;Reduced posterior propulsion Oral - Thin Cup: Delayed oral transit;Right anterior bolus  loss;Weak lingual manipulation;Reduced posterior propulsion Oral - Thin Straw: Delayed oral transit;Right anterior bolus  loss;Weak lingual manipulation;Reduced posterior propulsion Oral - Solids Oral - Puree: Delayed oral transit   Pharyngeal Phase Pharyngeal Phase Pharyngeal Phase: Impaired Pharyngeal - Nectar Pharyngeal - Nectar Teaspoon:  Premature spillage to pyriform  sinuses;Delayed swallow initiation;Reduced epiglottic  inversion;Reduced airway/laryngeal closure;Reduced tongue base  retraction;Penetration/Aspiration during  swallow;Penetration/Aspiration before swallow;Pharyngeal residue  - valleculae Penetration/Aspiration details (nectar teaspoon): Material enters  airway, remains ABOVE vocal cords and not ejected out;Material  does not enter airway Pharyngeal - Nectar Cup: Premature spillage to pyriform  sinuses;Delayed swallow initiation;Reduced epiglottic  inversion;Reduced airway/laryngeal closure;Reduced tongue base  retraction;Penetration/Aspiration during  swallow;Penetration/Aspiration before  swallow;Pharyngeal residue  - valleculae;Trace aspiration Penetration/Aspiration details (nectar cup): Material enters  airway, CONTACTS cords and not ejected out Pharyngeal - Nectar Straw: Premature spillage to pyriform  sinuses;Delayed swallow initiation;Reduced epiglottic  inversion;Reduced airway/laryngeal closure;Reduced tongue base  retraction;Penetration/Aspiration during  swallow;Penetration/Aspiration before swallow;Pharyngeal residue  - valleculae;Trace aspiration Penetration/Aspiration details (nectar straw): Material enters  airway, CONTACTS cords and not ejected out Pharyngeal - Thin Pharyngeal - Thin Teaspoon: Premature spillage to pyriform  sinuses;Delayed swallow initiation;Reduced epiglottic  inversion;Reduced airway/laryngeal closure;Reduced tongue base  retraction;Penetration/Aspiration during  swallow;Penetration/Aspiration before swallow;Pharyngeal residue  - valleculae;Trace aspiration Penetration/Aspiration details (thin teaspoon): Material enters  airway, CONTACTS cords and not ejected out Pharyngeal - Thin Cup: Premature spillage to pyriform  sinuses;Delayed swallow initiation;Reduced epiglottic  inversion;Reduced airway/laryngeal closure;Reduced tongue base  retraction;Penetration/Aspiration during   swallow;Penetration/Aspiration before swallow;Pharyngeal residue  - valleculae;Trace aspiration Penetration/Aspiration details (thin cup): Material enters  airway, passes BELOW cords without attempt by patient to eject  out (silent aspiration) Pharyngeal - Thin Straw: Premature spillage to pyriform  sinuses;Delayed swallow initiation;Reduced epiglottic  inversion;Reduced airway/laryngeal closure;Reduced tongue base  retraction;Penetration/Aspiration during  swallow;Penetration/Aspiration before swallow;Pharyngeal residue  - valleculae;Trace aspiration Penetration/Aspiration details (thin straw): Material enters  airway, passes BELOW cords without attempt by patient to eject  out (silent aspiration) Pharyngeal - Solids Pharyngeal - Puree: Delayed swallow initiation;Reduced epiglottic  inversion;Reduced tongue base retraction;Pharyngeal residue -  valleculae  Cervical Esophageal Phase    GO    Cervical Esophageal Phase Cervical Esophageal Phase: Impaired Cervical Esophageal Phase - Comment Cervical Esophageal Comment: appearance of mild hesitation of PO  at mid esophageal column        Herbie Baltimore, MA CCC-SLP [phone]  Katherene Ponto Deblois 06/02/2013, 2:56 PM     Microbiology: Recent Results (from the past 240 hour(s))  CULTURE, BLOOD (ROUTINE X 2)     Status: None   Collection Time    05/31/13  9:45 PM      Result Value Ref Range Status   Specimen Description BLOOD RIGHT ANTECUBITAL   Final   Special Requests BOTTLES DRAWN AEROBIC AND ANAEROBIC 5CC EA   Final   Culture  Setup Time     Final   Value: 06/01/2013 03:42     Performed at [facility]   Culture     Final   Value:        BLOOD CULTURE RECEIVED NO GROWTH TO DATE CULTURE WILL BE HELD FOR 5 DAYS BEFORE ISSUING A FINAL NEGATIVE REPORT     Performed at [facility]   Report Status PENDING   Incomplete  URINE CULTURE     Status: None   Collection Time    05/31/13 10:33 PM      Result Value Ref Range Status   Specimen  Description URINE, CLEAN CATCH   Final   Special Requests NONE   Final   Culture  Setup Time     Final   Value: 05/31/2013 23:16     Performed at [place]     Final   Value: NO GROWTH     Performed at [facility]   Culture     Final   Value: NO GROWTH     Performed at [facility]   Report Status 06/02/2013 FINAL   Final  CULTURE, BLOOD (ROUTINE X 2)     Status: None   Collection Time    05/31/13 10:38 PM      Result Value Ref Range  Status   Specimen Description BLOOD RIGHT WRIST   Final   Special Requests BOTTLES DRAWN AEROBIC ONLY 1CC   Final   Culture  Setup Time     Final   Value: 06/01/2013 03:42     Performed at [facility]   Culture     Final   Value:        BLOOD CULTURE RECEIVED NO GROWTH TO DATE CULTURE WILL BE HELD FOR 5 DAYS BEFORE ISSUING A FINAL NEGATIVE REPORT     Performed at [facility]   Report Status PENDING   Incomplete  MRSA PCR SCREENING     Status: None   Collection Time    06/01/13  1:44 AM      Result Value Ref Range Status   MRSA by PCR NEGATIVE  NEGATIVE Final   Comment:            The GeneXpert MRSA Assay (FDA     approved for NASAL specimens     only), is one component of a     comprehensive MRSA colonization     surveillance program. It is not     intended to diagnose MRSA     infection nor to guide or     monitor treatment for     MRSA infections.     Labs: Basic Metabolic Panel:  Recent Labs Lab 05/31/13 2102 06/01/13 0710 06/02/13 0736 06/03/13 0634  NA 139 141 140 141  K 4.3 4.3 4.1 4.0  CL 100 105 104 106  CO2 28 27 25 27   GLUCOSE 111* 94 77 95  BUN 33* 27* 22 20  CREATININE 1.45* 1.29 1.44* 1.32  CALCIUM 9.6 8.9 8.8 8.7   Liver Function Tests:  Recent Labs Lab 05/31/13 2102  AST 17  ALT 9  ALKPHOS 63  BILITOT 0.3  PROT 8.1  ALBUMIN 3.3*   CBC:  Recent Labs Lab 05/31/13 2102 06/01/13 0710 06/02/13 0736 06/03/13 06/03/13 0634  WBC 5.3 6.1 6.1   --  4.4  NEUTROABS 3.8  --   --   --   --   HGB 10.8* 9.8* 8.7* 8.7* 8.5*  HCT 34.8* 31.5* 28.5* 28.3* 27.3*  MCV 88.8 87.7 90.5  --  88.6  PLT 201 177 169  --  160   BNP: BNP (last 3 results)  Recent Labs  05/31/13 2124  PROBNP 1473.0*   CBG:  Recent Labs Lab 05/31/13 2115  GLUCAP 97       Signed:  Karen Kitchens [phone]  [facility] 06/05/2013, 11:40 AM

## 2013-06-04 NOTE — Clinical Social Work Note (Signed)
CSW faxed [facility] updated FL2 and H&P for review. CSW will update FL2 with meds once DC summary is received. CSW will continue to follow for DC needs.   Liz Beach MSW, [place], [place], [phone]

## 2013-06-04 NOTE — Discharge Summary (Signed)
Patient was seen, examined,treatment plan was discussed with the Physician extender. I have directly reviewed the clinical findings, lab, imaging studies and management of this patient in detail. I have made the necessary changes to the above noted documentation, and agree with the documentation, as recorded by the Physician extender.  Nena Alexander MD [facility].

## 2013-06-04 NOTE — Progress Notes (Signed)
Speech Language Pathology Treatment: Dysphagia  Patient Details Name: Christopher Cook MRN: [id number] DOB: 1921/03/19 Today's Date: 06/04/2013 Time: 7915-0569 SLP Time Calculation (min): 20 min  Assessment / Plan / Recommendation Clinical Impression  Pt. Required max visual/verbal/tactile cues to perform recommended swallow strategies.  Accuracy of second swallows were approximately 40%.  Cough prior and during po's with likely aspiration as pt. has chronic dysphagia.  Suspect pharyngeal residue due to delayed cough.  RN present and reiterated strategies to maximize safety.  Continue Dys 1 texture diet and nectar thick liquids, crush pills, ONLY give 1/2 tsp size nectar liquids.  Pt. To be discharged to SNF tomorrow.   HPI HPI: 80 yo male h/o dementia, htn, ckd, Parkinsons disease, TIA, seizure disorder lives in SNF comes in with sob and fever of unknown etiology.cxr and ua are unrevealing. More confused than usual. 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    Pertinent Vitals [facility]  SLP Plan  Continue with current plan of care    Recommendations Diet recommendations: Dysphagia 1 (puree);Nectar-thick liquid Liquids provided via: Teaspoon (1/2 tsp) Medication Administration: Crushed with puree Supervision: Patient able to self feed;Full supervision/cueing for compensatory strategies Compensations: Slow rate;Small sips/bites;Multiple dry swallows after each bite/sip;Clear throat after each swallow Postural Changes and/or Swallow Maneuvers: Seated upright 90 degrees;Upright 30-60 min after meal              Oral Care Recommendations: Oral care BID Follow up Recommendations: Skilled Nursing facility Plan: Continue with current plan of care    GO     Houston Siren M.Ed [employer] [phone]  06/04/2013

## 2013-06-04 NOTE — Progress Notes (Signed)
Patient was seen, examined,treatment plan was discussed with the Physician extender. I have directly reviewed the clinical findings, lab, imaging studies and management of this patient in detail. I have made the necessary changes to the above noted documentation, and agree with the documentation, as recorded by the Physician extender.  Nena Alexander MD [facility].

## 2013-06-04 NOTE — Progress Notes (Signed)
Pt passed 1 moderate sized clot from his penis. Urine was also noticed on the towel. Baltazar Najjar, NP notified. Will continue to monitor.

## 2013-06-05 NOTE — Progress Notes (Signed)
Pt d/c to SNF.  IV d/c'd. Skin intact except as most recently charted. Vitals are stable. Report called to receiving facility. Pt transported by ambulance service. Alena Blankenbeckler,RN6/04/2013 8:37 PM

## 2013-06-05 NOTE — Progress Notes (Signed)
[facility] PROGRESS NOTE  Christopher Cook HLK:[id number] DOB: 1921-04-06 DOA: 05/31/2013 PCP: Patricia Nettle, MD  Patient was medically ready for discharge on 6/3 but held overnight to permit Hospice to make arrangements for him at the [facility].  Assessment/Plan:  Aspiration / HCAP Afebrile. Patient found to be aspirating on Speech Eval.   MBS ordered.  Dysphagia 1 diet recommended.  Patient tolerating it well. On vanc / Zosyn IV x 3 days. Then continued on augmentin. Aspiration precautions.  Dysphagia 1, nectar thick.  Focal Motor Seizures Neurology consulted.   Depakote increased. No further seizure activity. CT Head negative for acute abnormality.  Altered mental status With baseline severe dementia. Patient now alert, responds with some Yes and No as well as unintelligible sounds.    Family at bedside.  Normocytic Anemia Hgb slowly declining.  No frank bleeding. Probably  Mild on-going hematuria combined with IV hydration. Will discontinue aspirin, plavix and heparin.  Dementia Severe.  History of Stroke Plavix discontinued due to hematuria and progression towards palliative care.   HTN Moderately controlled with lisinopril. Lasix currently held.     Parkinsons Continue Sinemet   Palliative Medicine Appreciate consult MOST form completed.  Continue antibiotics Hospice care at ALF. Plan to not return to the hospital.   DVT Prophylaxis:  Discontinued due to hematuria.   SCDs. Code Status: DNR Family Communication: Family meeting. Disposition Plan: To SNF when able.     Consultants:  Palliative  Antibiotics: Anti-infectives   Start     Dose/Rate Route Frequency Ordered Stop   06/04/13 0000  amoxicillin-clavulanate (AUGMENTIN XR) 1000-62.5 MG per tablet     2 tablet Oral Every 12 hours 06/04/13 1013     06/03/13 2200  amoxicillin-clavulanate (AUGMENTIN XR) 1000-62.5 MG per 12 hr tablet 2 tablet     2 tablet Oral Every 12  hours 06/03/13 1605     06/01/13 2330  vancomycin (VANCOCIN) 500 mg in sodium chloride 0.9 % 100 mL IVPB  Status:  Discontinued     500 mg 100 mL/hr over 60 Minutes Intravenous Every 24 hours 05/31/13 2253 06/03/13 1207   06/01/13 0600  piperacillin-tazobactam (ZOSYN) IVPB 3.375 g  Status:  Discontinued     3.375 g 12.5 mL/hr over 240 Minutes Intravenous 3 times per day 05/31/13 2253 06/03/13 1546   05/31/13 2230  piperacillin-tazobactam (ZOSYN) IVPB 3.375 g     3.375 g 100 mL/hr over 30 Minutes Intravenous  Once 05/31/13 2227 05/31/13 2346   05/31/13 2230  vancomycin (VANCOCIN) IVPB 1000 mg/200 mL premix     1,000 mg 200 mL/hr over 60 Minutes Intravenous  Once 05/31/13 2227 06/01/13 0012        HPI/Subjective: Denies pain.  Speech unintelligible.  Objective: Filed Vitals:   06/04/13 1402 06/04/13 2117 06/05/13 0522 06/05/13 1038  BP: 154/69 151/83 146/74 140/76  Pulse: 88 77 74   Temp: 97.9 F (36.6 C) 98.9 F (37.2 C) 98.5 F (36.9 C)   TempSrc: Oral Oral Axillary   Resp: 20 18 12    Height:      Weight:      SpO2: 97% 95% 93%     Intake/Output Summary (Last 24 hours) at 06/05/13 1146 Last data filed at 06/05/13 0600  Gross per 24 hour  Intake      0 ml  Output    700 ml  Net   -700 ml   Filed Weights   06/01/13 0146  Weight: 70.308 kg (155 lb)  Exam: General: Well developed, well nourished, NAD, severely demented. Does not make eye contact. Mumbles. HEENT:  Anicteic Sclera, MMM. No pharyngeal erythema or exudates  Neck: Supple, no JVD, no masses  Cardiovascular: RRR, S1 S2 auscultated, no rubs,  or gallops.  Slight systolic murmur. Respiratory: Clear to auscultation bilaterally with equal chest rise.  Decreased breath sounds on the right. No increased work of breathing. Abdomen: Soft, nontender, nondistended, + bowel sounds  Extremities: warm dry without cyanosis clubbing or edema.  Skin: Without rashes exudates or nodules.     Data Reviewed: Basic  Metabolic Panel:  Recent Labs Lab 05/31/13 2102 06/01/13 0710 06/02/13 0736 06/03/13 0634  NA 139 141 140 141  K 4.3 4.3 4.1 4.0  CL 100 105 104 106  CO2 28 27 25 27   GLUCOSE 111* 94 77 95  BUN 33* 27* 22 20  CREATININE 1.45* 1.29 1.44* 1.32  CALCIUM 9.6 8.9 8.8 8.7   Liver Function Tests:  Recent Labs Lab 05/31/13 2102  AST 17  ALT 9  ALKPHOS 63  BILITOT 0.3  PROT 8.1  ALBUMIN 3.3*   CBC:  Recent Labs Lab 05/31/13 2102 06/01/13 0710 06/02/13 0736 06/03/13 06/03/13 0634  WBC 5.3 6.1 6.1  --  4.4  NEUTROABS 3.8  --   --   --   --   HGB 10.8* 9.8* 8.7* 8.7* 8.5*  HCT 34.8* 31.5* 28.5* 28.3* 27.3*  MCV 88.8 87.7 90.5  --  88.6  PLT 201 177 169  --  160   BNP (last 3 results)  Recent Labs  05/31/13 2124  PROBNP 1473.0*   CBG:  Recent Labs Lab 05/31/13 2115  GLUCAP 97    Recent Results (from the past 240 hour(s))  CULTURE, BLOOD (ROUTINE X 2)     Status: None   Collection Time    05/31/13  9:45 PM      Result Value Ref Range Status   Specimen Description BLOOD RIGHT ANTECUBITAL   Final   Special Requests BOTTLES DRAWN AEROBIC AND ANAEROBIC 5CC EA   Final   Culture  Setup Time     Final   Value: 06/01/2013 03:42     Performed at [facility]   Culture     Final   Value:        BLOOD CULTURE RECEIVED NO GROWTH TO DATE CULTURE WILL BE HELD FOR 5 DAYS BEFORE ISSUING A FINAL NEGATIVE REPORT     Performed at [facility]   Report Status PENDING   Incomplete  URINE CULTURE     Status: None   Collection Time    05/31/13 10:33 PM      Result Value Ref Range Status   Specimen Description URINE, CLEAN CATCH   Final   Special Requests NONE   Final   Culture  Setup Time     Final   Value: 05/31/2013 23:16     Performed at [facility]     Final   Value: NO GROWTH     Performed at [facility]   Culture     Final   Value: NO GROWTH     Performed at [facility]   Report Status 06/02/2013 FINAL    Final  CULTURE, BLOOD (ROUTINE X 2)     Status: None   Collection Time    05/31/13 10:38 PM      Result Value Ref Range Status   Specimen Description BLOOD  RIGHT WRIST   Final   Special Requests BOTTLES DRAWN AEROBIC ONLY 1CC   Final   Culture  Setup Time     Final   Value: 06/01/2013 03:42     Performed at [facility]   Culture     Final   Value:        BLOOD CULTURE RECEIVED NO GROWTH TO DATE CULTURE WILL BE HELD FOR 5 DAYS BEFORE ISSUING A FINAL NEGATIVE REPORT     Performed at [facility]   Report Status PENDING   Incomplete  MRSA PCR SCREENING     Status: None   Collection Time    06/01/13  1:44 AM      Result Value Ref Range Status   MRSA by PCR NEGATIVE  NEGATIVE Final   Comment:            The GeneXpert MRSA Assay (FDA     approved for NASAL specimens     only), is one component of a     comprehensive MRSA colonization     surveillance program. It is not     intended to diagnose MRSA     infection nor to guide or     monitor treatment for     MRSA infections.     Studies: No results found.  Scheduled Meds: . allopurinol  300 mg Oral q morning - 10a  . amoxicillin-clavulanate  2 tablet Oral Q12H  . antiseptic oral rinse  15 mL Mouth Rinse BID  . carbidopa-levodopa  0.5 tablet Oral BID  . divalproex  375 mg Oral 3 times per day  . escitalopram  10 mg Oral q morning - 10a  . lisinopril  5 mg Oral Daily  . pneumococcal 23 valent vaccine  0.5 mL Intramuscular Tomorrow-1000  . traMADol  50 mg Oral Q8H   Continuous Infusions: . sodium chloride 75 mL/hr at 06/02/13 2252    Principal Problem:   Altered mental status Active Problems:   Parkinson disease   Dementia   Fever   SOB (shortness of breath)   Aspiration pneumonia   Palliative care encounter    Melton Alar, PA-C [facility] Pager [phone]. If 7PM-7AM, please contact night-coverage at [email or web address], password [facility] 06/05/2013, 11:46 AM  LOS: 5 days

## 2013-06-07 LAB — CULTURE, BLOOD (ROUTINE X 2)
CULTURE: NO GROWTH
Culture: NO GROWTH

## 2013-06-13 NOTE — Discharge Summary (Signed)
Patient was seen, examined,treatment plan was discussed with the Physician extender. I have directly reviewed the clinical findings, lab, imaging studies and management of this patient in detail. I have made the necessary changes to the above noted documentation, and agree with the documentation, as recorded by the Physician extender.  Nena Alexander MD [facility].

## 2013-06-13 NOTE — Progress Notes (Signed)
Patient was seen, examined,treatment plan was discussed with the Physician extender. I have directly reviewed the clinical findings, lab, imaging studies and management of this patient in detail. I have made the necessary changes to the above noted documentation, and agree with the documentation, as recorded by the Physician extender.  Nena Alexander MD [facility].

## 2013-12-02 DEATH — deceased
